# Patient Record
Sex: Male | Born: 1970
Health system: Southern US, Community
[De-identification: ages and names within clinical notes are randomized; demographics above are authoritative.]

## PROBLEM LIST (undated history)

## (undated) DIAGNOSIS — E785 Hyperlipidemia, unspecified: Secondary | ICD-10-CM

## (undated) DIAGNOSIS — I809 Phlebitis and thrombophlebitis of unspecified site: Secondary | ICD-10-CM

## (undated) DIAGNOSIS — I2699 Other pulmonary embolism without acute cor pulmonale: Secondary | ICD-10-CM

## (undated) DIAGNOSIS — K5090 Crohn's disease, unspecified, without complications: Secondary | ICD-10-CM

## (undated) HISTORY — DX: Hyperlipidemia, unspecified: E78.5

---

## 2002-11-25 ENCOUNTER — Ambulatory Visit (HOSPITAL_COMMUNITY): Admission: RE | Admit: 2002-11-25 | Discharge: 2002-11-25 | Payer: Self-pay | Admitting: *Deleted

## 2002-11-25 ENCOUNTER — Encounter (INDEPENDENT_AMBULATORY_CARE_PROVIDER_SITE_OTHER): Payer: Self-pay | Admitting: *Deleted

## 2005-09-18 ENCOUNTER — Encounter: Admission: RE | Admit: 2005-09-18 | Discharge: 2005-09-18 | Payer: Self-pay | Admitting: Family Medicine

## 2005-10-04 ENCOUNTER — Encounter: Admission: RE | Admit: 2005-10-04 | Discharge: 2005-10-04 | Payer: Self-pay | Admitting: Family Medicine

## 2006-03-05 ENCOUNTER — Encounter: Admission: RE | Admit: 2006-03-05 | Discharge: 2006-03-05 | Payer: Self-pay | Admitting: Gastroenterology

## 2006-11-26 ENCOUNTER — Encounter: Admission: RE | Admit: 2006-11-26 | Discharge: 2006-11-26 | Payer: Self-pay | Admitting: Gastroenterology

## 2006-12-18 ENCOUNTER — Encounter (HOSPITAL_COMMUNITY): Admission: RE | Admit: 2006-12-18 | Discharge: 2007-03-18 | Payer: Self-pay | Admitting: Gastroenterology

## 2007-02-12 ENCOUNTER — Ambulatory Visit: Payer: Self-pay | Admitting: Family Medicine

## 2007-02-17 DIAGNOSIS — K5090 Crohn's disease, unspecified, without complications: Secondary | ICD-10-CM | POA: Insufficient documentation

## 2007-02-18 ENCOUNTER — Encounter (INDEPENDENT_AMBULATORY_CARE_PROVIDER_SITE_OTHER): Payer: Self-pay | Admitting: Family Medicine

## 2007-02-18 ENCOUNTER — Telehealth (INDEPENDENT_AMBULATORY_CARE_PROVIDER_SITE_OTHER): Payer: Self-pay | Admitting: *Deleted

## 2007-02-18 LAB — CONVERTED CEMR LAB
Bilirubin Urine: NEGATIVE
Hemoglobin, Urine: NEGATIVE
Leukocytes, UA: NEGATIVE
Specific Gravity, Urine: 1.023 (ref 1.005–1.03)
Urine Glucose: NEGATIVE mg/dL

## 2007-04-02 ENCOUNTER — Encounter (HOSPITAL_COMMUNITY): Admission: RE | Admit: 2007-04-02 | Discharge: 2007-05-21 | Payer: Self-pay | Admitting: Gastroenterology

## 2007-04-23 ENCOUNTER — Encounter (INDEPENDENT_AMBULATORY_CARE_PROVIDER_SITE_OTHER): Payer: Self-pay | Admitting: Family Medicine

## 2007-05-28 ENCOUNTER — Encounter (HOSPITAL_COMMUNITY): Admission: RE | Admit: 2007-05-28 | Discharge: 2007-07-23 | Payer: Self-pay | Admitting: Gastroenterology

## 2007-07-12 ENCOUNTER — Encounter (INDEPENDENT_AMBULATORY_CARE_PROVIDER_SITE_OTHER): Payer: Self-pay | Admitting: Family Medicine

## 2007-08-18 ENCOUNTER — Encounter (INDEPENDENT_AMBULATORY_CARE_PROVIDER_SITE_OTHER): Payer: Self-pay | Admitting: Family Medicine

## 2007-09-17 ENCOUNTER — Encounter (HOSPITAL_COMMUNITY): Admission: RE | Admit: 2007-09-17 | Discharge: 2007-12-16 | Payer: Self-pay | Admitting: Gastroenterology

## 2007-11-19 ENCOUNTER — Encounter: Payer: Self-pay | Admitting: Family Medicine

## 2008-01-07 ENCOUNTER — Encounter (HOSPITAL_COMMUNITY): Admission: RE | Admit: 2008-01-07 | Discharge: 2008-04-06 | Payer: Self-pay | Admitting: Gastroenterology

## 2008-02-08 ENCOUNTER — Encounter: Payer: Self-pay | Admitting: Internal Medicine

## 2008-04-28 ENCOUNTER — Encounter (HOSPITAL_COMMUNITY): Admission: RE | Admit: 2008-04-28 | Discharge: 2008-06-07 | Payer: Self-pay | Admitting: Gastroenterology

## 2008-06-23 ENCOUNTER — Encounter (HOSPITAL_COMMUNITY): Admission: RE | Admit: 2008-06-23 | Discharge: 2008-08-23 | Payer: Self-pay | Admitting: Gastroenterology

## 2008-10-13 ENCOUNTER — Encounter (HOSPITAL_COMMUNITY): Admission: RE | Admit: 2008-10-13 | Discharge: 2009-01-11 | Payer: Self-pay | Admitting: Gastroenterology

## 2009-02-02 ENCOUNTER — Encounter (HOSPITAL_COMMUNITY): Admission: RE | Admit: 2009-02-02 | Discharge: 2009-02-02 | Payer: Self-pay | Admitting: Gastroenterology

## 2009-03-15 ENCOUNTER — Ambulatory Visit: Payer: Self-pay | Admitting: Diagnostic Radiology

## 2009-03-15 ENCOUNTER — Inpatient Hospital Stay (HOSPITAL_COMMUNITY): Admission: EM | Admit: 2009-03-15 | Discharge: 2009-03-16 | Payer: Self-pay | Admitting: Cardiology

## 2009-03-15 ENCOUNTER — Encounter: Payer: Self-pay | Admitting: Emergency Medicine

## 2009-03-15 ENCOUNTER — Ambulatory Visit: Payer: Self-pay | Admitting: Cardiovascular Disease

## 2009-03-30 ENCOUNTER — Encounter (HOSPITAL_COMMUNITY): Admission: RE | Admit: 2009-03-30 | Discharge: 2009-06-08 | Payer: Self-pay | Admitting: Gastroenterology

## 2009-08-03 ENCOUNTER — Encounter (HOSPITAL_COMMUNITY): Admission: RE | Admit: 2009-08-03 | Discharge: 2009-11-01 | Payer: Self-pay | Admitting: Gastroenterology

## 2009-11-30 ENCOUNTER — Encounter (HOSPITAL_COMMUNITY): Admission: RE | Admit: 2009-11-30 | Discharge: 2010-02-28 | Payer: Self-pay | Admitting: Gastroenterology

## 2010-03-29 ENCOUNTER — Encounter (HOSPITAL_COMMUNITY): Admission: RE | Admit: 2010-03-29 | Discharge: 2010-06-27 | Payer: Self-pay | Admitting: Gastroenterology

## 2010-07-19 ENCOUNTER — Encounter (HOSPITAL_COMMUNITY)
Admission: RE | Admit: 2010-07-19 | Discharge: 2010-10-15 | Payer: Self-pay | Source: Home / Self Care | Attending: Gastroenterology | Admitting: Gastroenterology

## 2010-11-08 ENCOUNTER — Encounter (HOSPITAL_COMMUNITY): Payer: BC Managed Care – PPO | Attending: Gastroenterology

## 2010-11-08 ENCOUNTER — Other Ambulatory Visit: Payer: Self-pay | Admitting: Gastroenterology

## 2010-11-08 DIAGNOSIS — K5090 Crohn's disease, unspecified, without complications: Secondary | ICD-10-CM | POA: Insufficient documentation

## 2010-11-08 LAB — COMPREHENSIVE METABOLIC PANEL
ALT: 37 U/L (ref 0–53)
AST: 32 U/L (ref 0–37)
Albumin: 3.7 g/dL (ref 3.5–5.2)
BUN: 11 mg/dL (ref 6–23)
CO2: 25 mEq/L (ref 19–32)
Chloride: 107 mEq/L (ref 96–112)
Glucose, Bld: 104 mg/dL — ABNORMAL HIGH (ref 70–99)
Total Protein: 6.9 g/dL (ref 6.0–8.3)

## 2010-11-08 LAB — DIFFERENTIAL
Basophils Absolute: 0 10*3/uL (ref 0.0–0.1)
Basophils Relative: 0 % (ref 0–1)
Eosinophils Absolute: 0.2 10*3/uL (ref 0.0–0.7)
Eosinophils Relative: 3 % (ref 0–5)
Lymphocytes Relative: 24 % (ref 12–46)
Lymphs Abs: 1.4 10*3/uL (ref 0.7–4.0)
Neutro Abs: 3.7 10*3/uL (ref 1.7–7.7)

## 2010-11-08 LAB — CBC
MCHC: 34.3 g/dL (ref 30.0–36.0)
MCV: 84.5 fL (ref 78.0–100.0)
RBC: 4.72 MIL/uL (ref 4.22–5.81)
WBC: 5.9 10*3/uL (ref 4.0–10.5)

## 2010-11-25 LAB — SEDIMENTATION RATE: Sed Rate: 19 mm/hr — ABNORMAL HIGH (ref 0–16)

## 2010-11-25 LAB — COMPREHENSIVE METABOLIC PANEL
AST: 31 U/L (ref 0–37)
Albumin: 3.5 g/dL (ref 3.5–5.2)
BUN: 12 mg/dL (ref 6–23)
CO2: 28 mEq/L (ref 19–32)
Calcium: 9.2 mg/dL (ref 8.4–10.5)
Chloride: 106 mEq/L (ref 96–112)
GFR calc Af Amer: >60 mL/min (ref >60)
Glucose, Bld: 74 mg/dL (ref 70–99)
Potassium: 4.3 mEq/L (ref 3.5–5.1)

## 2010-11-25 LAB — CBC
Platelets: 192 10*3/uL (ref 150–400)
WBC: 5.9 10*3/uL (ref 4.0–10.5)

## 2010-11-25 LAB — DIFFERENTIAL
Basophils Relative: 0 % (ref 0–1)
Eosinophils Absolute: 0.1 10*3/uL (ref 0.0–0.7)
Lymphocytes Relative: 26 % (ref 12–46)
Monocytes Relative: 12 % (ref 3–12)

## 2010-11-26 LAB — CBC
HCT: 40.3 % (ref 39.0–52.0)
Hemoglobin: 13.6 g/dL (ref 13.0–17.0)
MCH: 28.5 pg (ref 26.0–34.0)
MCHC: 33.7 g/dL (ref 30.0–36.0)
RBC: 4.78 MIL/uL (ref 4.22–5.81)
WBC: 6 10*3/uL (ref 4.0–10.5)

## 2010-11-26 LAB — COMPREHENSIVE METABOLIC PANEL
ALT: 23 U/L (ref 0–53)
Albumin: 3.7 g/dL (ref 3.5–5.2)
BUN: 11 mg/dL (ref 6–23)
Calcium: 9.2 mg/dL (ref 8.4–10.5)
GFR calc Af Amer: >60 mL/min (ref >60)
Glucose, Bld: 101 mg/dL — ABNORMAL HIGH (ref 70–99)
Total Bilirubin: 0.6 mg/dL (ref 0.3–1.2)

## 2010-11-26 LAB — DIFFERENTIAL
Basophils Absolute: 0 10*3/uL (ref 0.0–0.1)
Basophils Relative: 0 % (ref 0–1)
Eosinophils Absolute: 0.1 10*3/uL (ref 0.0–0.7)
Eosinophils Relative: 2 % (ref 0–5)
Lymphocytes Relative: 25 % (ref 12–46)
Neutrophils Relative %: 63 % (ref 43–77)

## 2010-11-28 LAB — CBC
MCH: 29 pg (ref 26.0–34.0)
Platelets: 223 10*3/uL (ref 150–400)
RBC: 4.83 MIL/uL (ref 4.22–5.81)

## 2010-11-28 LAB — SEDIMENTATION RATE: Sed Rate: 10 mm/hr (ref 0–16)

## 2010-11-28 LAB — DIFFERENTIAL
Lymphs Abs: 1.7 10*3/uL (ref 0.7–4.0)
Monocytes Relative: 11 % (ref 3–12)
Neutro Abs: 3.5 10*3/uL (ref 1.7–7.7)
Neutrophils Relative %: 57 % (ref 43–77)

## 2010-11-30 LAB — CBC
HCT: 37.6 % — ABNORMAL LOW (ref 39.0–52.0)
MCH: 28.9 pg (ref 26.0–34.0)
MCHC: 33 g/dL (ref 30.0–36.0)
MCV: 87.6 fL (ref 78.0–100.0)
Platelets: 216 10*3/uL (ref 150–400)

## 2010-11-30 LAB — DIFFERENTIAL
Monocytes Relative: 11 % (ref 3–12)
Neutrophils Relative %: 61 % (ref 43–77)

## 2010-11-30 LAB — SEDIMENTATION RATE: Sed Rate: 12 mm/hr (ref 0–16)

## 2010-12-03 LAB — COMPREHENSIVE METABOLIC PANEL
BUN: 10 mg/dL (ref 6–23)
CO2: 27 mEq/L (ref 19–32)
GFR calc Af Amer: >60 mL/min (ref >60)
GFR calc non Af Amer: >60 mL/min (ref >60)
Glucose, Bld: 111 mg/dL — ABNORMAL HIGH (ref 70–99)
Total Bilirubin: 0.5 mg/dL (ref 0.3–1.2)

## 2010-12-03 LAB — CBC
MCV: 86.9 fL (ref 78.0–100.0)
Platelets: 217 10*3/uL (ref 150–400)
RDW: 13.3 % (ref 11.5–15.5)

## 2010-12-03 LAB — DIFFERENTIAL
Eosinophils Absolute: 0.2 10*3/uL (ref 0.0–0.7)
Eosinophils Relative: 3 % (ref 0–5)
Lymphs Abs: 1.2 10*3/uL (ref 0.7–4.0)
Monocytes Relative: 8 % (ref 3–12)

## 2010-12-08 LAB — DIFFERENTIAL
Basophils Absolute: 0 10*3/uL (ref 0.0–0.1)
Basophils Relative: 1 % (ref 0–1)
Eosinophils Absolute: 0.1 10*3/uL (ref 0.0–0.7)
Eosinophils Relative: 2 % (ref 0–5)

## 2010-12-08 LAB — CBC
HCT: 38.3 % — ABNORMAL LOW (ref 39.0–52.0)
MCHC: 34 g/dL (ref 30.0–36.0)
MCV: 87.1 fL (ref 78.0–100.0)
Platelets: 223 10*3/uL (ref 150–400)
RDW: 13.6 % (ref 11.5–15.5)

## 2010-12-08 LAB — SEDIMENTATION RATE: Sed Rate: 20 mm/hr — ABNORMAL HIGH (ref 0–16)

## 2010-12-18 LAB — CBC
HCT: 36.8 % — ABNORMAL LOW (ref 39.0–52.0)
Platelets: 225 10*3/uL (ref 150–400)
RDW: 13.3 % (ref 11.5–15.5)

## 2010-12-18 LAB — DIFFERENTIAL
Basophils Absolute: 0 10*3/uL (ref 0.0–0.1)
Eosinophils Relative: 2 % (ref 0–5)
Lymphocytes Relative: 19 % (ref 12–46)

## 2010-12-18 LAB — SEDIMENTATION RATE: Sed Rate: 28 mm/hr — ABNORMAL HIGH (ref 0–16)

## 2010-12-20 LAB — DIFFERENTIAL
Basophils Absolute: 0 10*3/uL (ref 0.0–0.1)
Lymphocytes Relative: 15 % (ref 12–46)
Monocytes Absolute: 0.5 10*3/uL (ref 0.1–1.0)
Neutro Abs: 6.1 10*3/uL (ref 1.7–7.7)

## 2010-12-20 LAB — CBC
Hemoglobin: 12.3 g/dL — ABNORMAL LOW (ref 13.0–17.0)
Platelets: 239 10*3/uL (ref 150–400)
RDW: 13.4 % (ref 11.5–15.5)

## 2010-12-20 LAB — SEDIMENTATION RATE: Sed Rate: 29 mm/hr — ABNORMAL HIGH (ref 0–16)

## 2010-12-22 LAB — DIFFERENTIAL
Eosinophils Relative: 1 % (ref 0–5)
Lymphocytes Relative: 15 % (ref 12–46)
Lymphocytes Relative: 17 % (ref 12–46)
Lymphs Abs: 1.3 10*3/uL (ref 0.7–4.0)
Lymphs Abs: 1.4 10*3/uL (ref 0.7–4.0)
Monocytes Absolute: 0.6 10*3/uL (ref 0.1–1.0)
Monocytes Relative: 8 % (ref 3–12)
Neutro Abs: 6.2 10*3/uL (ref 1.7–7.7)
Neutro Abs: 6.5 10*3/uL (ref 1.7–7.7)

## 2010-12-22 LAB — CARDIAC PANEL(CRET KIN+CKTOT+MB+TROPI)
CK, MB: 1.9 ng/mL (ref 0.3–4.0)
Relative Index: 1.4 (ref 0.0–2.5)
Relative Index: 1.6 (ref 0.0–2.5)
Total CK: 145 U/L (ref 7–232)
Troponin I: 0.01 ng/mL (ref 0.00–0.06)

## 2010-12-22 LAB — HEPATIC FUNCTION PANEL
ALT: 23 U/L (ref 0–53)
AST: 28 U/L (ref 0–37)
Albumin: 4.2 g/dL (ref 3.5–5.2)
Alkaline Phosphatase: 85 U/L (ref 39–117)
Indirect Bilirubin: 0.2 mg/dL — ABNORMAL LOW (ref 0.3–0.9)
Total Protein: 7.8 g/dL (ref 6.0–8.3)

## 2010-12-22 LAB — CBC
HCT: 39.5 % (ref 39.0–52.0)
Hemoglobin: 12.4 g/dL — ABNORMAL LOW (ref 13.0–17.0)
Hemoglobin: 13.2 g/dL (ref 13.0–17.0)
Platelets: 239 10*3/uL (ref 150–400)
RBC: 4.13 MIL/uL — ABNORMAL LOW (ref 4.22–5.81)
WBC: 8.2 10*3/uL (ref 4.0–10.5)
WBC: 8.7 10*3/uL (ref 4.0–10.5)

## 2010-12-22 LAB — BASIC METABOLIC PANEL
BUN: 12 mg/dL (ref 6–23)
Calcium: 9.2 mg/dL (ref 8.4–10.5)
GFR calc non Af Amer: >60 mL/min (ref >60)
Potassium: 4.1 mEq/L (ref 3.5–5.1)
Sodium: 142 mEq/L (ref 135–145)

## 2010-12-22 LAB — POCT CARDIAC MARKERS
CKMB, poc: 1 ng/mL (ref 1.0–8.0)
CKMB, poc: 1.2 ng/mL (ref 1.0–8.0)
Myoglobin, poc: 88.8 ng/mL (ref 12–200)
Troponin i, poc: 0.12 ng/mL — ABNORMAL HIGH (ref 0.00–0.09)
Troponin i, poc: <0.05 ng/mL (ref 0.00–0.09)

## 2010-12-22 LAB — TSH: TSH: 1.067 u[IU]/mL (ref 0.350–4.500)

## 2010-12-24 LAB — CBC
MCHC: 34.3 g/dL (ref 30.0–36.0)
Platelets: 228 10*3/uL (ref 150–400)
RDW: 13.7 % (ref 11.5–15.5)

## 2010-12-24 LAB — DIFFERENTIAL
Basophils Absolute: 0.1 10*3/uL (ref 0.0–0.1)
Basophils Relative: 1 % (ref 0–1)
Lymphocytes Relative: 19 % (ref 12–46)
Neutro Abs: 5.3 10*3/uL (ref 1.7–7.7)
Neutrophils Relative %: 72 % (ref 43–77)

## 2010-12-24 LAB — SEDIMENTATION RATE: Sed Rate: 21 mm/hr — ABNORMAL HIGH (ref 0–16)

## 2010-12-26 LAB — DIFFERENTIAL
Basophils Absolute: 0.1 10*3/uL (ref 0.0–0.1)
Basophils Relative: 1 % (ref 0–1)
Neutro Abs: 4.8 10*3/uL (ref 1.7–7.7)
Neutrophils Relative %: 70 % (ref 43–77)

## 2010-12-26 LAB — CBC
Hemoglobin: 14 g/dL (ref 13.0–17.0)
MCHC: 34.2 g/dL (ref 30.0–36.0)
RBC: 4.77 MIL/uL (ref 4.22–5.81)

## 2010-12-26 LAB — SEDIMENTATION RATE: Sed Rate: 16 mm/hr (ref 0–16)

## 2010-12-30 LAB — DIFFERENTIAL
Basophils Relative: 0 % (ref 0–1)
Lymphs Abs: 1.1 10*3/uL (ref 0.7–4.0)
Monocytes Relative: 7 % (ref 3–12)
Neutro Abs: 6.5 10*3/uL (ref 1.7–7.7)
Neutrophils Relative %: 78 % — ABNORMAL HIGH (ref 43–77)

## 2010-12-30 LAB — SEDIMENTATION RATE: Sed Rate: 15 mm/hr (ref 0–16)

## 2010-12-30 LAB — CBC
RBC: 4.49 MIL/uL (ref 4.22–5.81)
WBC: 8.4 10*3/uL (ref 4.0–10.5)

## 2011-01-03 ENCOUNTER — Encounter (HOSPITAL_COMMUNITY): Payer: BC Managed Care – PPO | Attending: Gastroenterology

## 2011-01-03 ENCOUNTER — Other Ambulatory Visit: Payer: Self-pay | Admitting: Gastroenterology

## 2011-01-03 DIAGNOSIS — K5090 Crohn's disease, unspecified, without complications: Secondary | ICD-10-CM | POA: Insufficient documentation

## 2011-01-03 LAB — DIFFERENTIAL
Basophils Relative: 0 % (ref 0–1)
Lymphs Abs: 1.7 10*3/uL (ref 0.7–4.0)
Monocytes Relative: 9 % (ref 3–12)
Neutro Abs: 4.1 10*3/uL (ref 1.7–7.7)
Neutrophils Relative %: 63 % (ref 43–77)

## 2011-01-03 LAB — COMPREHENSIVE METABOLIC PANEL
ALT: 29 U/L (ref 0–53)
AST: 30 U/L (ref 0–37)
Albumin: 3.7 g/dL (ref 3.5–5.2)
Alkaline Phosphatase: 71 U/L (ref 39–117)
CO2: 26 mEq/L (ref 19–32)
Chloride: 105 mEq/L (ref 96–112)
Creatinine, Ser: 0.91 mg/dL (ref 0.4–1.5)
GFR calc Af Amer: >60 mL/min (ref >60)
GFR calc non Af Amer: >60 mL/min (ref >60)
Potassium: 4.2 mEq/L (ref 3.5–5.1)
Total Bilirubin: 0.6 mg/dL (ref 0.3–1.2)

## 2011-01-03 LAB — CBC
Hemoglobin: 13.7 g/dL (ref 13.0–17.0)
MCH: 29.1 pg (ref 26.0–34.0)
RBC: 4.71 MIL/uL (ref 4.22–5.81)
WBC: 6.5 10*3/uL (ref 4.0–10.5)

## 2011-01-28 NOTE — Assessment & Plan Note (Signed)
[facility] OFFICE NOTE   ILIA, ENGELBERT                   MRN:          [id number]  DATE:02/12/2007                            DOB:          1971/08/27    Jose [name] presents today to establish care in our office. He has a history  of Crohn's disease that is followed closely by gastroenterology.   The patient does have a concern that was brought up by his recent lab  work performed by his gastroenterologist. He was noted to have mild  hematuria. The patient reports that he does not note any blood in his  urine. He denies any urinary symptoms. He was not aware that he had  microscopic hematuria.   The patient also reports a long history of recurrent scaling of his  scalp during the spring season, usually by the summer it resolves. The  patient has never used any over-the-counter therapy for dandruff.   PAST MEDICAL HISTORY:  Crohn's colitis.   MEDICATIONS:  1. Remicade.  2. Asacol.  3. Fosamax.   ALLERGIES:  No known drug allergies.   SURGICAL HISTORY:  Anal fistula repair.   SOCIAL HISTORY:  The patient is an Chief Financial Officer. He is married with three  children. He smokes a 1/2 pack per day. He denies alcohol use.   REVIEW OF SYSTEMS:  As per the HPI, otherwise unremarkable.   OBJECTIVE:  VITAL SIGNS:  Weight 194.2 pounds, temperature 98.9, pulse  80, respiratory rate 20, blood pressure 100/70.  GENERAL:  Pleasant male in no acute distress, questions appropriately.  SCALP:  Significant for mild scaling around the hairline with minimal  erythema.  LUNGS:  Clear.  HEART:  Regular rate and rhythm, normal S1, S2, no murmurs, rubs, or  gallops.  ABDOMEN:  Soft, no palpable masses, no hepatosplenomegaly, no rebound or  guarding.   LABORATORY DATA:  Performed by Dr. Michail Sermon showed a urinalysis with 2+  of blood and 5-10 RBCs per high power field, specific gravity was 1.025  and was negative for  leukocytes and nitrites. Of note, this laboratory  test was done on 11/26/06.   IMPRESSION:  1. Seborrheic dermatitis.  2. History of microscopic hematuria.   PLAN:  1. Recommended that the patient use over-the-counter Selsun Blue or      Head & Shoulders as advised on the bottle.  2. Pathophysiology of seborrheic dermatitis was reviewed. I advised      the patient that this is a chronic recurrent condition, but it is      treatable and can be controlled. He is advised to follow up if the      above is not helpful.  3. UA and urine culture were ordered. Further recommendations after      review of the above.  4. The patient is to follow up as needed in the interim.     Leone Haven, M.D.  Electronically Signed    LA/MedQ  DD: 02/12/2007  DT: 02/13/2007  Job #: [id number]

## 2011-01-28 NOTE — H&P (Signed)
Jose [name], WISLER NO.:  [id number]   MEDICAL RECORD NO.:  [id number]          PATIENT TYPE:  INP   LOCATION:  2032                         FACILITY:  [facility]   PHYSICIAN:  Arlee Muslim, MD    DATE OF BIRTH:  [birth year]   DATE OF ADMISSION:  03/15/2009  DATE OF DISCHARGE:                              HISTORY & PHYSICAL   CHIEF COMPLAINT:  Chest discomfort.   HISTORY OF PRESENT ILLNESS:  The patient is a 40 year old male, past  medical history significant for hyperlipidemia, tobacco use, Crohn  disease, who is presenting with 2-day history of substernal chest  discomfort.  The patient states that over the past several months, he  has not felt himself.  He saw [name] as an outpatient, at which time  he had an exercise EKG which per his report was within normal limits.  Approximately 1 week ago, he was told that he had hyperlipidemia and was  started on simvastatin.  A day before today's admission, the patient had  onset of substernal chest discomfort associated with some left arm  numbness that lasted several hours.  The patient called the doctor's  office and was told to stop his simvastatin and see if the pain  improved.  The patient held the simvastatin and once again today had  recurrence of the this chest discomfort associated with left arm  numbness and some shortness of breath.  Of note, the patient states that  he does not usually exercise regularly but over the past week has  exercised on the treadmill several times for 40 minutes, each time  without any difficulty.  Specifically, he denies any chest discomfort or  exaggerated shortness of breath during exercise.  Of note, the patient  travels frequently and recently has had several trips to [place] in which  he was seated for prolonged period of time on the airplane.   PAST MEDICAL HISTORY:  As above.   SOCIAL HISTORY:  The patient smokes daily.  Does not drink alcohol.  There is no history of  drug use.   FAMILY HISTORY:  Positive for premature coronary disease.   ALLERGIES:  NO KNOWN DRUG ALLERGIES.   MEDICATIONS:  Remicade.  Fosamax.  Asacol.  Multivitamins.  Folic acid.   REVIEW OF SYSTEMS:  Positive for recent dizziness with exertion and also  positive for anxiety.  Other systems as in HPI, otherwise negative.   PHYSICAL EXAMINATION:  On physical exam, temperature 98, pulse 79,  respirations 14, blood pressure 127/77.  GENERAL:  In no acute distress.  HEENT: Nonfocal.  NECK:  Supple.  There is no JVD.  HEART:  Regular rate and rhythm without murmur or gallop.  LUNGS: Clear bilaterally.  ABDOMEN:  Soft, nontender, nondistended.  Chest wall is not tender to  palpation.  EXTREMITIES:  Without edema.  SKIN:  Warm and dry.  There is no evidence of rash or lesion.  NEURO:  Nonfocal.  PSYCHIATRIC:  The patient is appropriate with normal levels of insight.   LABS:  Sodium 142, potassium 4.1, chloride 104, CO2 28, BUN 12,  creatinine 1.1, glucose  91.  White count nine, hemoglobin 13, hematocrit  40, platelet count 239.   Chest x-ray is within normal limits.   Troponin:  The first troponin is 0.12.  The second troponin is less than  0.05.  CK-MB 1.2.  The second CK-MB is 1.0.   EKG is normal sinus rhythm without any ST or T-wave abnormalities.   ASSESSMENT:  Atypical chest discomfort in a patient with a recent  negative exercise stress EKG.  Because the patient's recent extensive  travel, pulmonary embolism is certainly on the differential.  Also on  the differential is statin myalgia.  Lastly, this could simply be  musculoskeletal abnormality that is not secondary to a medication.   PLAN:  The patient will be admitted to telemetry unit and ruled out for  acute myocardial infarction, although I suspect the probability of this  diagnosis is very low.  We will order a CT scan of the chest to rule out  pulmonary embolism.  We will place the patient on aspirin and a  low-dose  beta blocker.  We will continue to hold the patient's Zocor.  Lastly, we  will place the patient on Lovenox 40 mg subcu daily for DVT prophylaxis.      Arlee Muslim, MD  Electronically Signed     SGA/MEDQ  D:  03/15/2009  T:  03/15/2009  Job:  [id number]

## 2011-01-31 NOTE — Op Note (Signed)
   Jose [name], Jose [name]                      ACCOUNT NO.:  [id number]   MEDICAL RECORD NO.:  [id number]                   PATIENT TYPE:  AMB   LOCATION:  DAY                                  FACILITY:  [facility]   PHYSICIAN:  Darrelyn Hillock, M.D.         DATE OF BIRTH:  02-12-1971   DATE OF PROCEDURE:  11/25/2002  DATE OF DISCHARGE:                                 OPERATIVE REPORT   PREOPERATIVE DIAGNOSES:  Anal pain.   POSTOPERATIVE DIAGNOSES:  Anal pain, active anorectal Crohn's.   PROCEDURE:  Rectal ultrasound, rigid sigmoidoscopy, anal biopsy.   ANESTHESIA:  General.   DESCRIPTION OF PROCEDURE:  After general anesthesia was induced, he was  placed in lithotomy position and perirectal prep was undertaken. On visual  inspection, he had significant fibrosis and scarring from his previous  fistulotomies. Rectal ultrasound was then performed by Dr. Annabell Sabal who  will dictate this in a separate note. Rigid sigmoidoscopy was then  accomplished at 20 cm. From 10 cm distal, the patient had very active  inflammation of the mucosa and hypertrophy of the crypts and papilla. On  visual inspection of the distal rectum and anus, the patient had very  significant inflammation with mucosal bridging and marked bleeding secondary  to inflammation. The mucosal bridge was biopsied after excision and sent for  pathologic evaluation. Adequate hemostasis was ensured. Gelfoam packing was  placed.                                               Darrelyn Hillock, M.D.    KRH/MEDQ  D:  11/25/2002  T:  11/25/2002  Job:  [id number]   cc:   Raelyn Ensign. Vladimir Faster, M.D.  [address], [place]  [place] [address]  Fax: [phone]

## 2011-02-28 ENCOUNTER — Encounter (HOSPITAL_COMMUNITY): Payer: BC Managed Care – PPO | Attending: Gastroenterology

## 2011-02-28 DIAGNOSIS — K5090 Crohn's disease, unspecified, without complications: Secondary | ICD-10-CM | POA: Insufficient documentation

## 2011-04-25 ENCOUNTER — Encounter (HOSPITAL_COMMUNITY): Payer: BC Managed Care – PPO | Attending: Gastroenterology

## 2011-04-25 ENCOUNTER — Other Ambulatory Visit: Payer: Self-pay | Admitting: Gastroenterology

## 2011-04-25 DIAGNOSIS — K5090 Crohn's disease, unspecified, without complications: Secondary | ICD-10-CM | POA: Insufficient documentation

## 2011-04-25 LAB — COMPREHENSIVE METABOLIC PANEL
ALT: 27 U/L (ref 0–53)
Albumin: 3.5 g/dL (ref 3.5–5.2)
Alkaline Phosphatase: 86 U/L (ref 39–117)
BUN: 15 mg/dL (ref 6–23)
Chloride: 104 mEq/L (ref 96–112)
Glucose, Bld: 142 mg/dL — ABNORMAL HIGH (ref 70–99)
Potassium: 3.9 mEq/L (ref 3.5–5.1)
Sodium: 138 mEq/L (ref 135–145)
Total Bilirubin: 0.3 mg/dL (ref 0.3–1.2)
Total Protein: 7 g/dL (ref 6.0–8.3)

## 2011-04-25 LAB — DIFFERENTIAL
Basophils Relative: 1 % (ref 0–1)
Eosinophils Absolute: 0.2 10*3/uL (ref 0.0–0.7)
Lymphs Abs: 1.6 10*3/uL (ref 0.7–4.0)
Monocytes Relative: 7 % (ref 3–12)
Neutro Abs: 3.6 10*3/uL (ref 1.7–7.7)
Neutrophils Relative %: 62 % (ref 43–77)

## 2011-04-25 LAB — CBC
Hemoglobin: 13.6 g/dL (ref 13.0–17.0)
MCH: 28.9 pg (ref 26.0–34.0)
Platelets: 208 10*3/uL (ref 150–400)
RBC: 4.71 MIL/uL (ref 4.22–5.81)
WBC: 5.8 10*3/uL (ref 4.0–10.5)

## 2011-05-18 ENCOUNTER — Emergency Department (HOSPITAL_BASED_OUTPATIENT_CLINIC_OR_DEPARTMENT_OTHER)
Admission: EM | Admit: 2011-05-18 | Discharge: 2011-05-18 | Disposition: A | Discharge status: Home / Self Care | Payer: BC Managed Care – PPO | Attending: Emergency Medicine | Admitting: Emergency Medicine

## 2011-05-18 ENCOUNTER — Encounter: Payer: Self-pay | Admitting: *Deleted

## 2011-05-18 DIAGNOSIS — I808 Phlebitis and thrombophlebitis of other sites: Secondary | ICD-10-CM

## 2011-05-18 DIAGNOSIS — K5090 Crohn's disease, unspecified, without complications: Secondary | ICD-10-CM | POA: Insufficient documentation

## 2011-05-18 DIAGNOSIS — F17200 Nicotine dependence, unspecified, uncomplicated: Secondary | ICD-10-CM | POA: Insufficient documentation

## 2011-05-18 DIAGNOSIS — M79609 Pain in unspecified limb: Secondary | ICD-10-CM | POA: Insufficient documentation

## 2011-05-18 HISTORY — DX: Crohn's disease, unspecified, without complications: K50.90

## 2011-05-18 NOTE — ED Notes (Signed)
Pt states he had an IV infusion on August 10th. Began to notice pain and small "bubbles"/"bumps" in the area. Went to Urgent Care 4 days ago. Ultrasound done. Diagnosed with blood clot. Told to take Advil and Aspirin and it would probably resolve in 2 weeks. Pt is traveling tomorrow by air and worried that something will happen.

## 2011-05-18 NOTE — ED Provider Notes (Signed)
Scribed for Dr. Fonnie Jarvis, the patient was seen in room CT2. This chart was scribed by Hillery Hunter. This patient's care was started at 22:31.   History   CSN: [id number] Arrival date & time: 05/18/2011  9:29 PM  Chief Complaint  Patient presents with  . Extremity Pain   Patient is a 40 y.o. male presenting with extremity pain. The history is provided by the patient.  Extremity Pain Pertinent negatives include no chest pain, no abdominal pain, no headaches and no shortness of breath.    Jose [name] is a 40 y.o. male who presents to the Emergency Department complaining of lump on the back of his hand at the site of IV infusion of Remicade (22 days ago) to prevent Crohn's flare up just as he usually does every two months. He reports some irritation at the area with activity and carrying his children today. He is concerned about possible danger of superficial blood clot in the area. He went to urgent care about five days ago and was told to manage pain with Ibuprofen and to expect this to resolve after a long time.   Past Medical History  Diagnosis Date  . Crohn's disease     History reviewed. No pertinent past surgical history.  History reviewed. No pertinent family history.  History  Substance Use Topics  . Smoking status: Current Everyday Smoker  . Smokeless tobacco: Not on file  . Alcohol Use: No      Review of Systems  Constitutional: Negative for fever and activity change.  HENT: Negative for congestion and sore throat.   Respiratory: Negative for shortness of breath.   Cardiovascular: Negative for chest pain.  Gastrointestinal: Negative for abdominal pain.  Skin: Negative for rash.  Neurological: Negative for weakness, numbness and headaches.  Psychiatric/Behavioral: Negative for confusion.  All other systems reviewed and are negative.    Physical Exam  BP 128/71  Pulse 76  Temp(Src) 98.4 F (36.9 C) (Oral)  Resp 20  Ht 5\' 7"  (1.702 m)  Wt  184 lb (83.462 kg)  BMI 28.82 kg/m2  SpO2 98%  Physical Exam  Nursing note and vitals reviewed. Constitutional:       Awake, alert, nontoxic appearance.  HENT:  Head: Atraumatic.  Eyes: Right eye exhibits no discharge. Left eye exhibits no discharge.  Neck: Neck supple.  Cardiovascular:       Good capillary refill all extremities  Pulmonary/Chest: Effort normal. He exhibits no tenderness.  Abdominal: Soft. There is no tenderness. There is no rebound.  Musculoskeletal: He exhibits no tenderness.       Baseline ROM, no obvious new focal weakness.  Lymphadenopathy:    He has no cervical adenopathy.  Neurological:       Mental status and motor strength appears baseline for patient and situation.  Skin: No rash noted.       Six inches of superficial thrombophlebitis dorsum of left wrist  Psychiatric: He has a normal mood and affect.  Ext: left arm superficial thrombophlebitis only distal forearm without cellulitis, and left hand normal LT/FROM/NT and motor intact with CR<2secs.  ED Course  Procedures  DIAGNOSTIC STUDIES: Oxygen Saturation is 98% on room air, normal by my interpretation.     MDM: I doubt any other EMC precluding discharge at this time including, but not necessarily limited to the following:DVT.   IMPRESSION: Diagnoses that have been ruled out:  Diagnoses that are still under consideration:  Final diagnoses:    PLAN:  Discharge home  CONDITION ON DISCHARGE: Good   SCRIBE ATTESTATION: I personally performed the services described in this documentation, which was scribed in my presence. The recorded information has been reviewed and considered. No att. providers found       Hurman Horn, MD 05/19/11 1314

## 2011-06-05 LAB — SEDIMENTATION RATE: Sed Rate: 21 — ABNORMAL HIGH

## 2011-06-05 LAB — CBC
HCT: 37.1 — ABNORMAL LOW
MCV: 83.9
Platelets: 232
RDW: 13.5
WBC: 5.8

## 2011-06-06 LAB — SEDIMENTATION RATE: Sed Rate: 25 — ABNORMAL HIGH

## 2011-06-06 LAB — CBC
MCHC: 33.7
Platelets: 265
RDW: 12.9

## 2011-06-10 LAB — CBC
Platelets: 270
RBC: 4.64
WBC: 7.5

## 2011-06-10 LAB — SEDIMENTATION RATE: Sed Rate: 27 — ABNORMAL HIGH

## 2011-06-12 LAB — CBC
HCT: 39.6
Hemoglobin: 13.6
RBC: 4.66
RDW: 13.6
WBC: 7.4

## 2011-06-13 ENCOUNTER — Encounter (HOSPITAL_COMMUNITY)
Admission: RE | Admit: 2011-06-13 | Discharge: 2011-06-13 | Disposition: A | Discharge status: Home / Self Care | Payer: BC Managed Care – PPO | Source: Ambulatory Visit | Attending: Gastroenterology | Admitting: Gastroenterology

## 2011-06-13 DIAGNOSIS — K5090 Crohn's disease, unspecified, without complications: Secondary | ICD-10-CM | POA: Insufficient documentation

## 2011-06-13 LAB — CBC
HCT: 40.7
Hemoglobin: 13.8
MCV: 85.7
RBC: 4.75
WBC: 7.6

## 2011-06-13 LAB — SEDIMENTATION RATE: Sed Rate: 20 — ABNORMAL HIGH

## 2011-06-17 LAB — CBC
HCT: 38.6 % — ABNORMAL LOW (ref 39.0–52.0)
Hemoglobin: 12.6 g/dL — ABNORMAL LOW (ref 13.0–17.0)
MCV: 86.3 fL (ref 78.0–100.0)
RDW: 14.1 % (ref 11.5–15.5)

## 2011-06-20 ENCOUNTER — Encounter (HOSPITAL_COMMUNITY): Payer: BC Managed Care – PPO

## 2011-06-20 LAB — SEDIMENTATION RATE: Sed Rate: 22 mm/hr — ABNORMAL HIGH (ref 0–16)

## 2011-06-20 LAB — CBC
HCT: 38.5 % — ABNORMAL LOW (ref 39.0–52.0)
Platelets: 222 10*3/uL (ref 150–400)
RDW: 13.9 % (ref 11.5–15.5)

## 2011-06-27 LAB — SEDIMENTATION RATE: Sed Rate: 15

## 2011-06-27 LAB — CBC
MCHC: 34.3
RBC: 4.45
RDW: 13.4

## 2011-06-30 LAB — CBC
HCT: 37.4 — ABNORMAL LOW
Platelets: 219
RBC: 4.3
WBC: 7.6

## 2011-06-30 LAB — SEDIMENTATION RATE: Sed Rate: 11

## 2011-07-15 ENCOUNTER — Other Ambulatory Visit (HOSPITAL_COMMUNITY): Payer: Self-pay | Admitting: *Deleted

## 2011-07-30 MED ORDER — SODIUM CHLORIDE 0.9 % IV SOLN: 7.5000 mg/kg | INTRAVENOUS | Status: DC

## 2011-07-30 MED ORDER — METHYLPREDNISOLONE SODIUM SUCC 125 MG IJ SOLR: 40.0000 mg | Freq: Once | INTRAMUSCULAR | Status: DC

## 2011-07-30 MED ORDER — DIPHENHYDRAMINE HCL 50 MG/ML IJ SOLN: 50.0000 mg | Freq: Once | INTRAMUSCULAR | Status: DC

## 2011-07-30 MED ORDER — ACETAMINOPHEN 325 MG PO TABS: 650.0000 mg | ORAL_TABLET | Freq: Once | ORAL | Status: DC

## 2011-08-01 ENCOUNTER — Encounter (HOSPITAL_COMMUNITY): Payer: BC Managed Care – PPO

## 2011-08-08 ENCOUNTER — Encounter (HOSPITAL_COMMUNITY)
Admission: RE | Admit: 2011-08-08 | Discharge: 2011-08-08 | Disposition: A | Discharge status: Home / Self Care | Payer: BC Managed Care – PPO | Source: Ambulatory Visit | Attending: Gastroenterology | Admitting: Gastroenterology

## 2011-08-08 ENCOUNTER — Encounter (HOSPITAL_COMMUNITY): Payer: BC Managed Care – PPO

## 2011-08-08 DIAGNOSIS — K5090 Crohn's disease, unspecified, without complications: Secondary | ICD-10-CM | POA: Insufficient documentation

## 2011-08-08 LAB — CBC
Hemoglobin: 13.6 g/dL (ref 13.0–17.0)
RBC: 4.67 MIL/uL (ref 4.22–5.81)

## 2011-08-08 LAB — DIFFERENTIAL
Basophils Absolute: 0 10*3/uL (ref 0.0–0.1)
Eosinophils Absolute: 0.2 10*3/uL (ref 0.0–0.7)
Eosinophils Relative: 3 % (ref 0–5)

## 2011-08-08 LAB — COMPREHENSIVE METABOLIC PANEL
ALT: 22 U/L (ref 0–53)
Alkaline Phosphatase: 84 U/L (ref 39–117)
CO2: 25 mEq/L (ref 19–32)
GFR calc Af Amer: >90 mL/min (ref >90)
GFR calc non Af Amer: >90 mL/min (ref >90)
Glucose, Bld: 85 mg/dL (ref 70–99)
Potassium: 4.2 mEq/L (ref 3.5–5.1)
Sodium: 140 mEq/L (ref 135–145)
Total Bilirubin: 0.2 mg/dL — ABNORMAL LOW (ref 0.3–1.2)

## 2011-08-08 MED ORDER — SODIUM CHLORIDE 0.9 % IV SOLN
7.5000 mg/kg | INTRAVENOUS | Status: AC
  Administered 2011-08-08: 600 mg via INTRAVENOUS
  Filled 2011-08-08: qty 60

## 2011-08-08 MED ORDER — METHYLPREDNISOLONE SODIUM SUCC 125 MG IJ SOLR
40.0000 mg | INTRAMUSCULAR | Status: DC
  Administered 2011-08-08: 40 mg via INTRAVENOUS

## 2011-08-08 MED ORDER — DIPHENHYDRAMINE HCL 50 MG/ML IJ SOLN
50.0000 mg | INTRAMUSCULAR | Status: AC
  Administered 2011-08-08: 50 mg via INTRAVENOUS

## 2011-08-08 MED ORDER — METHYLPREDNISOLONE SODIUM SUCC 40 MG IJ SOLR
INTRAMUSCULAR | Status: AC
  Administered 2011-08-08: 40 mg via INTRAVENOUS
  Filled 2011-08-08: qty 1

## 2011-08-08 MED ORDER — ACETAMINOPHEN 325 MG PO TABS
650.0000 mg | ORAL_TABLET | ORAL | Status: DC
  Administered 2011-08-08: 650 mg via ORAL

## 2011-08-08 MED ORDER — DIPHENHYDRAMINE HCL 50 MG/ML IJ SOLN
INTRAMUSCULAR | Status: AC
  Administered 2011-08-08: 50 mg via INTRAVENOUS
  Filled 2011-08-08: qty 1

## 2011-08-08 MED ORDER — ACETAMINOPHEN 325 MG PO TABS
ORAL_TABLET | ORAL | Status: AC
  Administered 2011-08-08: 650 mg via ORAL
  Filled 2011-08-08: qty 2

## 2011-09-26 ENCOUNTER — Encounter (HOSPITAL_COMMUNITY)
Admission: RE | Admit: 2011-09-26 | Discharge: 2011-09-26 | Disposition: A | Discharge status: Home / Self Care | Payer: BC Managed Care – PPO | Source: Ambulatory Visit | Attending: Gastroenterology | Admitting: Gastroenterology

## 2011-09-26 DIAGNOSIS — K5090 Crohn's disease, unspecified, without complications: Secondary | ICD-10-CM | POA: Insufficient documentation

## 2011-09-26 MED ORDER — ACETAMINOPHEN 325 MG PO TABS: 650.0000 mg | ORAL_TABLET | ORAL | Status: DC

## 2011-09-26 MED ORDER — ACETAMINOPHEN 325 MG PO TABS
ORAL_TABLET | ORAL | Status: AC
  Administered 2011-09-26: 650 mg
  Filled 2011-09-26: qty 2

## 2011-09-26 MED ORDER — SODIUM CHLORIDE 0.9 % IV SOLN
7.5000 mg/kg | INTRAVENOUS | Status: AC
  Administered 2011-09-26: 600 mg via INTRAVENOUS
  Filled 2011-09-26: qty 60

## 2011-09-26 MED ORDER — METHYLPREDNISOLONE SODIUM SUCC 125 MG IJ SOLR: 40.0000 mg | INTRAMUSCULAR | Status: DC

## 2011-09-26 MED ORDER — DIPHENHYDRAMINE HCL 50 MG/ML IJ SOLN: 50.0000 mg | INTRAMUSCULAR | Status: DC

## 2011-09-26 MED ORDER — METHYLPREDNISOLONE SODIUM SUCC 40 MG IJ SOLR
INTRAMUSCULAR | Status: AC
  Administered 2011-09-26: 40 mg
  Filled 2011-09-26: qty 1

## 2011-09-26 MED ORDER — DIPHENHYDRAMINE HCL 50 MG/ML IJ SOLN
INTRAMUSCULAR | Status: AC
  Administered 2011-09-26: 50 mg
  Filled 2011-09-26: qty 1

## 2011-11-10 ENCOUNTER — Other Ambulatory Visit (HOSPITAL_COMMUNITY): Payer: Self-pay | Admitting: *Deleted

## 2011-11-14 ENCOUNTER — Encounter (HOSPITAL_COMMUNITY)
Admission: RE | Admit: 2011-11-14 | Discharge: 2011-11-14 | Disposition: A | Discharge status: Home / Self Care | Payer: BC Managed Care – PPO | Source: Ambulatory Visit | Attending: Gastroenterology | Admitting: Gastroenterology

## 2011-11-14 DIAGNOSIS — K5090 Crohn's disease, unspecified, without complications: Secondary | ICD-10-CM | POA: Insufficient documentation

## 2011-11-14 LAB — CBC
Platelets: 178 10*3/uL (ref 150–400)
RDW: 13.1 % (ref 11.5–15.5)
WBC: 7.6 10*3/uL (ref 4.0–10.5)

## 2011-11-14 LAB — DIFFERENTIAL
Basophils Relative: 0 % (ref 0–1)
Eosinophils Absolute: 0.2 10*3/uL (ref 0.0–0.7)
Monocytes Absolute: 0.8 10*3/uL (ref 0.1–1.0)
Monocytes Relative: 11 % (ref 3–12)

## 2011-11-14 LAB — COMPREHENSIVE METABOLIC PANEL
AST: 24 U/L (ref 0–37)
Albumin: 3.6 g/dL (ref 3.5–5.2)
Alkaline Phosphatase: 87 U/L (ref 39–117)
Chloride: 107 mEq/L (ref 96–112)
Potassium: 4.1 mEq/L (ref 3.5–5.1)
Total Bilirubin: 0.1 mg/dL — ABNORMAL LOW (ref 0.3–1.2)

## 2011-11-14 MED ORDER — ACETAMINOPHEN 325 MG PO TABS
650.0000 mg | ORAL_TABLET | ORAL | Status: DC
  Administered 2011-11-14: 650 mg via ORAL
  Filled 2011-11-14: qty 2

## 2011-11-14 MED ORDER — SODIUM CHLORIDE 0.9 % IV SOLN
7.5000 mg/kg | INTRAVENOUS | Status: AC
  Administered 2011-11-14: 600 mg via INTRAVENOUS
  Filled 2011-11-14: qty 60

## 2011-11-14 MED ORDER — DIPHENHYDRAMINE HCL 50 MG/ML IJ SOLN
50.0000 mg | INTRAMUSCULAR | Status: DC
  Administered 2011-11-14: 50 mg via INTRAVENOUS
  Filled 2011-11-14: qty 1

## 2011-11-14 MED ORDER — METHYLPREDNISOLONE SODIUM SUCC 40 MG IJ SOLR
INTRAMUSCULAR | Status: AC
  Administered 2011-11-14: 40 mg via INTRAVENOUS
  Filled 2011-11-14: qty 1

## 2011-11-14 MED ORDER — METHYLPREDNISOLONE SODIUM SUCC 125 MG IJ SOLR
40.0000 mg | INTRAMUSCULAR | Status: DC
  Administered 2011-11-14: 40 mg via INTRAVENOUS

## 2011-12-29 ENCOUNTER — Other Ambulatory Visit (HOSPITAL_COMMUNITY): Payer: Self-pay | Admitting: *Deleted

## 2012-01-02 ENCOUNTER — Encounter (HOSPITAL_COMMUNITY)
Admission: RE | Admit: 2012-01-02 | Discharge: 2012-01-02 | Disposition: A | Discharge status: Home / Self Care | Payer: BC Managed Care – PPO | Source: Ambulatory Visit | Attending: Gastroenterology | Admitting: Gastroenterology

## 2012-01-02 DIAGNOSIS — K5090 Crohn's disease, unspecified, without complications: Secondary | ICD-10-CM | POA: Insufficient documentation

## 2012-01-02 MED ORDER — DIPHENHYDRAMINE HCL 50 MG/ML IJ SOLN
50.0000 mg | INTRAMUSCULAR | Status: DC
  Administered 2012-01-02: 50 mg via INTRAVENOUS
  Filled 2012-01-02: qty 1

## 2012-01-02 MED ORDER — SODIUM CHLORIDE 0.9 % IV SOLN
7.5000 mg/kg | INTRAVENOUS | Status: DC
  Administered 2012-01-02: 600 mg via INTRAVENOUS
  Filled 2012-01-02: qty 60

## 2012-01-02 MED ORDER — SODIUM CHLORIDE 0.9 % IV SOLN: Freq: Once | INTRAVENOUS | Status: DC

## 2012-01-02 MED ORDER — METHYLPREDNISOLONE SODIUM SUCC 125 MG IJ SOLR
40.0000 mg | INTRAMUSCULAR | Status: DC
  Administered 2012-01-02: 40 mg via INTRAVENOUS
  Filled 2012-01-02: qty 2

## 2012-01-02 MED ORDER — ACETAMINOPHEN 325 MG PO TABS
650.0000 mg | ORAL_TABLET | ORAL | Status: DC
  Administered 2012-01-02: 650 mg via ORAL
  Filled 2012-01-02: qty 2

## 2012-02-19 ENCOUNTER — Other Ambulatory Visit (HOSPITAL_COMMUNITY): Payer: Self-pay | Admitting: *Deleted

## 2012-02-20 ENCOUNTER — Encounter (HOSPITAL_COMMUNITY)
Admission: RE | Admit: 2012-02-20 | Discharge: 2012-02-20 | Disposition: A | Discharge status: Home / Self Care | Payer: BC Managed Care – PPO | Source: Ambulatory Visit | Attending: Gastroenterology | Admitting: Gastroenterology

## 2012-02-20 DIAGNOSIS — K5090 Crohn's disease, unspecified, without complications: Secondary | ICD-10-CM | POA: Insufficient documentation

## 2012-02-20 LAB — CBC
Hemoglobin: 13.3 g/dL (ref 13.0–17.0)
MCH: 28.7 pg (ref 26.0–34.0)
MCHC: 33.8 g/dL (ref 30.0–36.0)
Platelets: 194 10*3/uL (ref 150–400)

## 2012-02-20 LAB — COMPREHENSIVE METABOLIC PANEL
ALT: 42 U/L (ref 0–53)
Calcium: 9 mg/dL (ref 8.4–10.5)
Creatinine, Ser: 0.83 mg/dL (ref 0.50–1.35)
GFR calc Af Amer: >90 mL/min (ref >90)
Glucose, Bld: 118 mg/dL — ABNORMAL HIGH (ref 70–99)
Sodium: 139 mEq/L (ref 135–145)
Total Protein: 6.7 g/dL (ref 6.0–8.3)

## 2012-02-20 LAB — DIFFERENTIAL
Eosinophils Relative: 3 % (ref 0–5)
Lymphocytes Relative: 25 % (ref 12–46)
Lymphs Abs: 1.3 10*3/uL (ref 0.7–4.0)
Neutro Abs: 3.2 10*3/uL (ref 1.7–7.7)

## 2012-02-20 MED ORDER — DIPHENHYDRAMINE HCL 50 MG/ML IJ SOLN
50.0000 mg | INTRAMUSCULAR | Status: DC
  Administered 2012-02-20: 50 mg via INTRAVENOUS
  Filled 2012-02-20: qty 1

## 2012-02-20 MED ORDER — SODIUM CHLORIDE 0.9 % IV SOLN
INTRAVENOUS | Status: DC
  Administered 2012-02-20: 09:00:00 via INTRAVENOUS

## 2012-02-20 MED ORDER — SODIUM CHLORIDE 0.9 % IV SOLN
7.5000 mg/kg | INTRAVENOUS | Status: DC
  Administered 2012-02-20: 600 mg via INTRAVENOUS
  Filled 2012-02-20: qty 60

## 2012-02-20 MED ORDER — ACETAMINOPHEN 325 MG PO TABS
650.0000 mg | ORAL_TABLET | ORAL | Status: DC
  Administered 2012-02-20: 650 mg via ORAL
  Filled 2012-02-20: qty 2

## 2012-02-20 MED ORDER — METHYLPREDNISOLONE SODIUM SUCC 125 MG IJ SOLR
40.0000 mg | INTRAMUSCULAR | Status: DC
  Administered 2012-02-20: 40 mg via INTRAVENOUS
  Filled 2012-02-20: qty 2

## 2012-04-06 ENCOUNTER — Other Ambulatory Visit (HOSPITAL_COMMUNITY): Payer: Self-pay | Admitting: *Deleted

## 2012-04-09 ENCOUNTER — Encounter (HOSPITAL_COMMUNITY)
Admission: RE | Admit: 2012-04-09 | Discharge: 2012-04-09 | Disposition: A | Discharge status: Home / Self Care | Payer: BC Managed Care – PPO | Source: Ambulatory Visit | Attending: Gastroenterology | Admitting: Gastroenterology

## 2012-04-09 DIAGNOSIS — K5090 Crohn's disease, unspecified, without complications: Secondary | ICD-10-CM | POA: Insufficient documentation

## 2012-04-09 MED ORDER — ACETAMINOPHEN 325 MG PO TABS
650.0000 mg | ORAL_TABLET | ORAL | Status: DC
  Administered 2012-04-09: 650 mg via ORAL
  Filled 2012-04-09: qty 2

## 2012-04-09 MED ORDER — DIPHENHYDRAMINE HCL 50 MG/ML IJ SOLN
50.0000 mg | INTRAMUSCULAR | Status: DC
  Administered 2012-04-09: 50 mg via INTRAVENOUS
  Filled 2012-04-09 (×2): qty 1

## 2012-04-09 MED ORDER — SODIUM CHLORIDE 0.9 % IV SOLN
7.5000 mg/kg | INTRAVENOUS | Status: DC
  Administered 2012-04-09: 600 mg via INTRAVENOUS
  Filled 2012-04-09: qty 60

## 2012-04-09 MED ORDER — METHYLPREDNISOLONE SODIUM SUCC 125 MG IJ SOLR: 40.0000 mg | INTRAMUSCULAR | Status: DC

## 2012-04-09 MED ORDER — METHYLPREDNISOLONE SODIUM SUCC 40 MG IJ SOLR
INTRAMUSCULAR | Status: AC
  Administered 2012-04-09: 40 mg
  Filled 2012-04-09: qty 1

## 2012-04-09 MED ORDER — SODIUM CHLORIDE 0.9 % IV SOLN
INTRAVENOUS | Status: DC
  Administered 2012-04-09: 09:00:00 via INTRAVENOUS

## 2012-05-27 ENCOUNTER — Other Ambulatory Visit (HOSPITAL_COMMUNITY): Payer: Self-pay | Admitting: *Deleted

## 2012-05-28 ENCOUNTER — Encounter (HOSPITAL_COMMUNITY)
Admission: RE | Admit: 2012-05-28 | Discharge: 2012-05-28 | Disposition: A | Discharge status: Home / Self Care | Payer: BC Managed Care – PPO | Source: Ambulatory Visit | Attending: Gastroenterology | Admitting: Gastroenterology

## 2012-05-28 DIAGNOSIS — K5090 Crohn's disease, unspecified, without complications: Secondary | ICD-10-CM | POA: Insufficient documentation

## 2012-05-28 LAB — DIFFERENTIAL
Basophils Relative: 1 % (ref 0–1)
Eosinophils Absolute: 0.1 10*3/uL (ref 0.0–0.7)
Neutrophils Relative %: 63 % (ref 43–77)

## 2012-05-28 LAB — COMPREHENSIVE METABOLIC PANEL
Alkaline Phosphatase: 77 U/L (ref 39–117)
BUN: 12 mg/dL (ref 6–23)
Chloride: 105 mEq/L (ref 96–112)
GFR calc Af Amer: >90 mL/min (ref >90)
GFR calc non Af Amer: >90 mL/min (ref >90)
Glucose, Bld: 109 mg/dL — ABNORMAL HIGH (ref 70–99)
Potassium: 3.9 mEq/L (ref 3.5–5.1)
Total Bilirubin: 0.2 mg/dL — ABNORMAL LOW (ref 0.3–1.2)

## 2012-05-28 LAB — CBC
HCT: 38.9 % — ABNORMAL LOW (ref 39.0–52.0)
Hemoglobin: 13 g/dL (ref 13.0–17.0)
MCHC: 33.4 g/dL (ref 30.0–36.0)

## 2012-05-28 MED ORDER — GLUCAGON HCL (RDNA) 1 MG IJ SOLR
INTRAMUSCULAR | Status: AC
  Filled 2012-05-28: qty 1

## 2012-05-28 MED ORDER — ACETAMINOPHEN 325 MG PO TABS
650.0000 mg | ORAL_TABLET | ORAL | Status: DC
  Administered 2012-05-28: 650 mg via ORAL
  Filled 2012-05-28: qty 2

## 2012-05-28 MED ORDER — DIPHENHYDRAMINE HCL 50 MG/ML IJ SOLN
50.0000 mg | INTRAMUSCULAR | Status: DC
  Administered 2012-05-28: 50 mg via INTRAVENOUS

## 2012-05-28 MED ORDER — SODIUM CHLORIDE 0.9 % IV SOLN
7.5000 mg/kg | INTRAVENOUS | Status: DC
  Administered 2012-05-28: 600 mg via INTRAVENOUS
  Filled 2012-05-28: qty 60

## 2012-05-28 MED ORDER — DIPHENHYDRAMINE HCL 50 MG/ML IJ SOLN
INTRAMUSCULAR | Status: AC
  Filled 2012-05-28: qty 1

## 2012-05-28 MED ORDER — METHYLPREDNISOLONE SODIUM SUCC 40 MG IJ SOLR
INTRAMUSCULAR | Status: AC
  Administered 2012-05-28: 40 mg via INTRAVENOUS
  Filled 2012-05-28: qty 1

## 2012-05-28 MED ORDER — SODIUM CHLORIDE 0.9 % IV SOLN
INTRAVENOUS | Status: DC
  Administered 2012-05-28: 09:00:00 via INTRAVENOUS

## 2012-05-28 MED ORDER — METHYLPREDNISOLONE SODIUM SUCC 125 MG IJ SOLR: 40.0000 mg | INTRAMUSCULAR | Status: DC

## 2012-05-31 MED FILL — Glucagon HCl (rDNA) For Inj 1 MG (Base Equiv): INTRAMUSCULAR | Qty: 1 | Status: AC

## 2012-07-16 ENCOUNTER — Encounter (HOSPITAL_COMMUNITY)
Admission: RE | Admit: 2012-07-16 | Discharge: 2012-07-16 | Disposition: A | Discharge status: Home / Self Care | Payer: BC Managed Care – PPO | Source: Ambulatory Visit | Attending: Gastroenterology | Admitting: Gastroenterology

## 2012-07-16 DIAGNOSIS — K5090 Crohn's disease, unspecified, without complications: Secondary | ICD-10-CM | POA: Insufficient documentation

## 2012-07-16 MED ORDER — INFLIXIMAB 100 MG IV SOLR
7.5000 mg/kg | INTRAVENOUS | Status: AC
  Administered 2012-07-16: 600 mg via INTRAVENOUS
  Filled 2012-07-16: qty 60

## 2012-07-16 MED ORDER — METHYLPREDNISOLONE SODIUM SUCC 125 MG IJ SOLR: 40.0000 mg | INTRAMUSCULAR | Status: DC

## 2012-07-16 MED ORDER — DIPHENHYDRAMINE HCL 50 MG/ML IJ SOLN
50.0000 mg | INTRAMUSCULAR | Status: DC
  Administered 2012-07-16: 50 mg via INTRAVENOUS

## 2012-07-16 MED ORDER — METHYLPREDNISOLONE SODIUM SUCC 40 MG IJ SOLR
INTRAMUSCULAR | Status: AC
  Administered 2012-07-16: 40 mg
  Filled 2012-07-16: qty 1

## 2012-07-16 MED ORDER — DIPHENHYDRAMINE HCL 50 MG/ML IJ SOLN
INTRAMUSCULAR | Status: AC
  Administered 2012-07-16: 50 mg via INTRAVENOUS
  Filled 2012-07-16: qty 1

## 2012-07-16 MED ORDER — ACETAMINOPHEN 325 MG PO TABS
ORAL_TABLET | ORAL | Status: AC
  Administered 2012-07-16: 650 mg via ORAL
  Filled 2012-07-16: qty 2

## 2012-07-16 MED ORDER — SODIUM CHLORIDE 0.9 % IV SOLN
INTRAVENOUS | Status: DC
  Administered 2012-07-16: 11:00:00 via INTRAVENOUS

## 2012-07-16 MED ORDER — ACETAMINOPHEN 325 MG PO TABS
650.0000 mg | ORAL_TABLET | ORAL | Status: DC
  Administered 2012-07-16: 650 mg via ORAL

## 2012-07-29 ENCOUNTER — Encounter (HOSPITAL_COMMUNITY): Payer: BC Managed Care – PPO

## 2012-09-02 ENCOUNTER — Other Ambulatory Visit (HOSPITAL_COMMUNITY): Payer: Self-pay | Admitting: *Deleted

## 2012-09-03 ENCOUNTER — Encounter (HOSPITAL_COMMUNITY)
Admission: RE | Admit: 2012-09-03 | Discharge: 2012-09-03 | Disposition: A | Discharge status: Home / Self Care | Payer: BC Managed Care – PPO | Source: Ambulatory Visit | Attending: Gastroenterology | Admitting: Gastroenterology

## 2012-09-03 DIAGNOSIS — K5090 Crohn's disease, unspecified, without complications: Secondary | ICD-10-CM | POA: Insufficient documentation

## 2012-09-03 LAB — DIFFERENTIAL
Eosinophils Absolute: 0.1 10*3/uL (ref 0.0–0.7)
Lymphocytes Relative: 22 % (ref 12–46)
Lymphs Abs: 1.6 10*3/uL (ref 0.7–4.0)
Monocytes Relative: 9 % (ref 3–12)
Neutro Abs: 4.8 10*3/uL (ref 1.7–7.7)
Neutrophils Relative %: 67 % (ref 43–77)

## 2012-09-03 LAB — COMPREHENSIVE METABOLIC PANEL
Albumin: 3.6 g/dL (ref 3.5–5.2)
BUN: 17 mg/dL (ref 6–23)
Calcium: 9.4 mg/dL (ref 8.4–10.5)
Creatinine, Ser: 0.97 mg/dL (ref 0.50–1.35)
GFR calc Af Amer: >90 mL/min (ref >90)
Glucose, Bld: 93 mg/dL (ref 70–99)
Potassium: 4.1 mEq/L (ref 3.5–5.1)
Total Protein: 7.2 g/dL (ref 6.0–8.3)

## 2012-09-03 LAB — CBC
HCT: 42.8 % (ref 39.0–52.0)
Hemoglobin: 14.4 g/dL (ref 13.0–17.0)
MCH: 29.3 pg (ref 26.0–34.0)
MCHC: 33.6 g/dL (ref 30.0–36.0)
RDW: 13.6 % (ref 11.5–15.5)

## 2012-09-03 MED ORDER — SODIUM CHLORIDE 0.9 % IV SOLN
7.5000 mg/kg | INTRAVENOUS | Status: AC
  Administered 2012-09-03: 600 mg via INTRAVENOUS
  Filled 2012-09-03: qty 60

## 2012-09-03 MED ORDER — SODIUM CHLORIDE 0.9 % IV SOLN
INTRAVENOUS | Status: DC
  Administered 2012-09-03: 10:00:00 via INTRAVENOUS

## 2012-09-03 MED ORDER — DIPHENHYDRAMINE HCL 50 MG/ML IJ SOLN
INTRAMUSCULAR | Status: AC
  Administered 2012-09-03: 50 mg via INTRAVENOUS
  Filled 2012-09-03: qty 1

## 2012-09-03 MED ORDER — METHYLPREDNISOLONE SODIUM SUCC 40 MG IJ SOLR
INTRAMUSCULAR | Status: AC
  Administered 2012-09-03: 40 mg
  Filled 2012-09-03: qty 1

## 2012-09-03 MED ORDER — DIPHENHYDRAMINE HCL 50 MG/ML IJ SOLN: 50.0000 mg | INTRAMUSCULAR | Status: DC

## 2012-09-03 MED ORDER — METHYLPREDNISOLONE SODIUM SUCC 125 MG IJ SOLR: 40.0000 mg | INTRAMUSCULAR | Status: DC

## 2012-09-03 MED ORDER — ACETAMINOPHEN 325 MG PO TABS
ORAL_TABLET | ORAL | Status: AC
  Filled 2012-09-03: qty 2

## 2012-09-03 MED ORDER — ACETAMINOPHEN 325 MG PO TABS
650.0000 mg | ORAL_TABLET | ORAL | Status: DC
  Administered 2012-09-03: 650 mg via ORAL

## 2012-10-21 ENCOUNTER — Other Ambulatory Visit (HOSPITAL_COMMUNITY): Payer: Self-pay | Admitting: *Deleted

## 2012-10-22 ENCOUNTER — Encounter (HOSPITAL_COMMUNITY)
Admission: RE | Admit: 2012-10-22 | Discharge: 2012-10-22 | Disposition: A | Discharge status: Home / Self Care | Payer: BC Managed Care – PPO | Source: Ambulatory Visit | Attending: Gastroenterology | Admitting: Gastroenterology

## 2012-10-22 DIAGNOSIS — K5090 Crohn's disease, unspecified, without complications: Secondary | ICD-10-CM | POA: Insufficient documentation

## 2012-10-22 LAB — CBC
HCT: 43.5 % (ref 39.0–52.0)
Hemoglobin: 15.2 g/dL (ref 13.0–17.0)
MCH: 30.3 pg (ref 26.0–34.0)
MCV: 86.8 fL (ref 78.0–100.0)
Platelets: 169 10*3/uL (ref 150–400)
RBC: 5.01 MIL/uL (ref 4.22–5.81)
WBC: 7.6 10*3/uL (ref 4.0–10.5)

## 2012-10-22 LAB — COMPREHENSIVE METABOLIC PANEL
AST: 28 U/L (ref 0–37)
CO2: 27 mEq/L (ref 19–32)
Chloride: 103 mEq/L (ref 96–112)
Creatinine, Ser: 1.04 mg/dL (ref 0.50–1.35)
GFR calc Af Amer: >90 mL/min (ref >90)
GFR calc non Af Amer: 86 mL/min — ABNORMAL LOW (ref >90)
Glucose, Bld: 80 mg/dL (ref 70–99)
Total Bilirubin: 0.5 mg/dL (ref 0.3–1.2)

## 2012-10-22 LAB — DIFFERENTIAL
Basophils Absolute: 0 10*3/uL (ref 0.0–0.1)
Basophils Relative: 1 % (ref 0–1)
Eosinophils Relative: 2 % (ref 0–5)
Monocytes Absolute: 0.7 10*3/uL (ref 0.1–1.0)
Neutro Abs: 4.6 10*3/uL (ref 1.7–7.7)

## 2012-10-22 MED ORDER — ACETAMINOPHEN 325 MG PO TABS
ORAL_TABLET | ORAL | Status: AC
  Administered 2012-10-22: 650 mg
  Filled 2012-10-22: qty 2

## 2012-10-22 MED ORDER — DIPHENHYDRAMINE HCL 50 MG/ML IJ SOLN: 50.0000 mg | Freq: Once | INTRAMUSCULAR | Status: DC

## 2012-10-22 MED ORDER — METHYLPREDNISOLONE SODIUM SUCC 40 MG IJ SOLR
INTRAMUSCULAR | Status: AC
  Administered 2012-10-22: 40 mg
  Filled 2012-10-22: qty 1

## 2012-10-22 MED ORDER — ACETAMINOPHEN 325 MG PO TABS: 650.0000 mg | ORAL_TABLET | Freq: Once | ORAL | Status: DC

## 2012-10-22 MED ORDER — SODIUM CHLORIDE 0.9 % IV SOLN
7.5000 mg/kg | Freq: Once | INTRAVENOUS | Status: AC
  Administered 2012-10-22: 600 mg via INTRAVENOUS
  Filled 2012-10-22: qty 60

## 2012-10-22 MED ORDER — SODIUM CHLORIDE 0.9 % IV SOLN: Freq: Once | INTRAVENOUS | Status: DC

## 2012-10-22 MED ORDER — METHYLPREDNISOLONE SODIUM SUCC 40 MG IJ SOLR: 40.0000 mg | Freq: Once | INTRAMUSCULAR | Status: DC

## 2012-10-22 MED ORDER — DIPHENHYDRAMINE HCL 50 MG/ML IJ SOLN
INTRAMUSCULAR | Status: AC
  Administered 2012-10-22: 50 mg
  Filled 2012-10-22: qty 1

## 2012-12-09 ENCOUNTER — Other Ambulatory Visit (HOSPITAL_COMMUNITY): Payer: Self-pay | Admitting: *Deleted

## 2012-12-10 ENCOUNTER — Encounter (HOSPITAL_COMMUNITY)
Admission: RE | Admit: 2012-12-10 | Discharge: 2012-12-10 | Disposition: A | Discharge status: Home / Self Care | Payer: BC Managed Care – PPO | Source: Ambulatory Visit | Attending: Gastroenterology | Admitting: Gastroenterology

## 2012-12-10 DIAGNOSIS — K5090 Crohn's disease, unspecified, without complications: Secondary | ICD-10-CM | POA: Insufficient documentation

## 2012-12-10 MED ORDER — SODIUM CHLORIDE 0.9 % IV SOLN: INTRAVENOUS | Status: DC

## 2012-12-10 MED ORDER — ACETAMINOPHEN 325 MG PO TABS
ORAL_TABLET | ORAL | Status: AC
  Filled 2012-12-10: qty 2

## 2012-12-10 MED ORDER — METHYLPREDNISOLONE SODIUM SUCC 40 MG IJ SOLR: 40.0000 mg | INTRAMUSCULAR | Status: DC

## 2012-12-10 MED ORDER — ACETAMINOPHEN 325 MG PO TABS
650.0000 mg | ORAL_TABLET | ORAL | Status: DC
  Administered 2012-12-10: 650 mg via ORAL

## 2012-12-10 MED ORDER — METHYLPREDNISOLONE SODIUM SUCC 40 MG IJ SOLR
INTRAMUSCULAR | Status: AC
  Filled 2012-12-10: qty 1

## 2012-12-10 MED ORDER — INFLIXIMAB 100 MG IV SOLR
7.5000 mg/kg | INTRAVENOUS | Status: DC
  Administered 2012-12-10: 600 mg via INTRAVENOUS
  Filled 2012-12-10: qty 60

## 2012-12-10 MED ORDER — DIPHENHYDRAMINE HCL 50 MG/ML IJ SOLN: 50.0000 mg | INTRAMUSCULAR | Status: DC

## 2012-12-10 MED ORDER — DIPHENHYDRAMINE HCL 50 MG/ML IJ SOLN
INTRAMUSCULAR | Status: AC
  Administered 2012-12-10: 50 mg
  Filled 2012-12-10: qty 1

## 2013-02-03 ENCOUNTER — Other Ambulatory Visit (HOSPITAL_COMMUNITY): Payer: Self-pay | Admitting: *Deleted

## 2013-02-04 ENCOUNTER — Encounter (HOSPITAL_COMMUNITY)
Admission: RE | Admit: 2013-02-04 | Discharge: 2013-02-04 | Disposition: A | Discharge status: Home / Self Care | Payer: BC Managed Care – PPO | Source: Ambulatory Visit | Attending: Gastroenterology | Admitting: Gastroenterology

## 2013-02-04 ENCOUNTER — Other Ambulatory Visit (HOSPITAL_COMMUNITY): Payer: Self-pay | Admitting: *Deleted

## 2013-02-04 DIAGNOSIS — K5090 Crohn's disease, unspecified, without complications: Secondary | ICD-10-CM | POA: Insufficient documentation

## 2013-02-04 LAB — DIFFERENTIAL
Basophils Absolute: 0 10*3/uL (ref 0.0–0.1)
Eosinophils Relative: 3 % (ref 0–5)
Lymphocytes Relative: 23 % (ref 12–46)

## 2013-02-04 LAB — CBC
HCT: 38.3 % — ABNORMAL LOW (ref 39.0–52.0)
Hemoglobin: 13.3 g/dL (ref 13.0–17.0)
MCH: 30 pg (ref 26.0–34.0)
MCV: 86.5 fL (ref 78.0–100.0)
RBC: 4.43 MIL/uL (ref 4.22–5.81)

## 2013-02-04 LAB — COMPREHENSIVE METABOLIC PANEL
ALT: 27 U/L (ref 0–53)
CO2: 21 mEq/L (ref 19–32)
Calcium: 8.4 mg/dL (ref 8.4–10.5)
Creatinine, Ser: 0.84 mg/dL (ref 0.50–1.35)
GFR calc Af Amer: >90 mL/min (ref >90)
GFR calc non Af Amer: >90 mL/min (ref >90)
Glucose, Bld: 94 mg/dL (ref 70–99)

## 2013-02-04 MED ORDER — ACETAMINOPHEN 325 MG PO TABS: 650.0000 mg | ORAL_TABLET | ORAL | Status: DC

## 2013-02-04 MED ORDER — METHYLPREDNISOLONE SODIUM SUCC 40 MG IJ SOLR: 40.0000 mg | INTRAMUSCULAR | Status: DC

## 2013-02-04 MED ORDER — DIPHENHYDRAMINE HCL 50 MG/ML IJ SOLN
INTRAMUSCULAR | Status: AC
  Administered 2013-02-04: 50 mg via INTRAVENOUS
  Filled 2013-02-04: qty 1

## 2013-02-04 MED ORDER — METHYLPREDNISOLONE SODIUM SUCC 125 MG IJ SOLR
INTRAMUSCULAR | Status: AC
  Administered 2013-02-04: 40 mg
  Filled 2013-02-04: qty 2

## 2013-02-04 MED ORDER — DIPHENHYDRAMINE HCL 50 MG/ML IJ SOLN: 50.0000 mg | INTRAMUSCULAR | Status: DC

## 2013-02-04 MED ORDER — ACETAMINOPHEN 325 MG PO TABS
ORAL_TABLET | ORAL | Status: AC
  Administered 2013-02-04: 650 mg via ORAL
  Filled 2013-02-04: qty 2

## 2013-02-04 MED ORDER — INFLIXIMAB 100 MG IV SOLR
7.5000 mg/kg | INTRAVENOUS | Status: DC
  Administered 2013-02-04: 600 mg via INTRAVENOUS
  Filled 2013-02-04: qty 60

## 2013-02-04 MED ORDER — SODIUM CHLORIDE 0.9 % IV SOLN
INTRAVENOUS | Status: DC
  Administered 2013-02-04: 10:00:00 via INTRAVENOUS

## 2013-03-31 ENCOUNTER — Other Ambulatory Visit (HOSPITAL_COMMUNITY): Payer: Self-pay | Admitting: *Deleted

## 2013-04-01 ENCOUNTER — Encounter (HOSPITAL_COMMUNITY)
Admission: RE | Admit: 2013-04-01 | Discharge: 2013-04-01 | Disposition: A | Discharge status: Home / Self Care | Payer: BC Managed Care – PPO | Source: Ambulatory Visit | Attending: Gastroenterology | Admitting: Gastroenterology

## 2013-04-01 DIAGNOSIS — K5090 Crohn's disease, unspecified, without complications: Secondary | ICD-10-CM | POA: Insufficient documentation

## 2013-04-01 MED ORDER — METHYLPREDNISOLONE SODIUM SUCC 40 MG IJ SOLR
INTRAMUSCULAR | Status: AC
  Administered 2013-04-01: 40 mg
  Filled 2013-04-01: qty 1

## 2013-04-01 MED ORDER — SODIUM CHLORIDE 0.9 % IV SOLN
INTRAVENOUS | Status: DC
  Administered 2013-04-01: 10:00:00 via INTRAVENOUS

## 2013-04-01 MED ORDER — INFLIXIMAB 100 MG IV SOLR
7.5000 mg/kg | INTRAVENOUS | Status: DC
  Administered 2013-04-01: 600 mg via INTRAVENOUS
  Filled 2013-04-01: qty 60

## 2013-04-01 MED ORDER — DIPHENHYDRAMINE HCL 50 MG/ML IJ SOLN: 50.0000 mg | INTRAMUSCULAR | Status: DC

## 2013-04-01 MED ORDER — METHYLPREDNISOLONE SODIUM SUCC 40 MG IJ SOLR: 40.0000 mg | INTRAMUSCULAR | Status: DC

## 2013-04-01 MED ORDER — ACETAMINOPHEN 325 MG PO TABS: 650.0000 mg | ORAL_TABLET | ORAL | Status: DC

## 2013-04-01 MED ORDER — DIPHENHYDRAMINE HCL 50 MG/ML IJ SOLN
INTRAMUSCULAR | Status: AC
  Administered 2013-04-01: 50 mg via INTRAVENOUS
  Filled 2013-04-01: qty 1

## 2013-04-01 MED ORDER — ACETAMINOPHEN 325 MG PO TABS
ORAL_TABLET | ORAL | Status: AC
  Administered 2013-04-01: 325 mg
  Filled 2013-04-01: qty 2

## 2013-05-26 ENCOUNTER — Other Ambulatory Visit (HOSPITAL_COMMUNITY): Payer: Self-pay | Admitting: *Deleted

## 2013-05-27 ENCOUNTER — Encounter (HOSPITAL_COMMUNITY)
Admission: RE | Admit: 2013-05-27 | Discharge: 2013-05-27 | Disposition: A | Discharge status: Home / Self Care | Payer: BC Managed Care – PPO | Source: Ambulatory Visit | Attending: Gastroenterology | Admitting: Gastroenterology

## 2013-05-27 DIAGNOSIS — K5090 Crohn's disease, unspecified, without complications: Secondary | ICD-10-CM | POA: Insufficient documentation

## 2013-05-27 LAB — COMPREHENSIVE METABOLIC PANEL
ALT: 23 U/L (ref 0–53)
Alkaline Phosphatase: 74 U/L (ref 39–117)
CO2: 23 mEq/L (ref 19–32)
GFR calc Af Amer: >90 mL/min (ref >90)
GFR calc non Af Amer: >90 mL/min (ref >90)
Glucose, Bld: 105 mg/dL — ABNORMAL HIGH (ref 70–99)
Potassium: 4 mEq/L (ref 3.5–5.1)
Sodium: 135 mEq/L (ref 135–145)

## 2013-05-27 LAB — CBC
Hemoglobin: 14.7 g/dL (ref 13.0–17.0)
MCH: 29.2 pg (ref 26.0–34.0)
RBC: 5.03 MIL/uL (ref 4.22–5.81)

## 2013-05-27 LAB — DIFFERENTIAL
Basophils Absolute: 0 10*3/uL (ref 0.0–0.1)
Eosinophils Absolute: 0.3 10*3/uL (ref 0.0–0.7)
Eosinophils Relative: 4 % (ref 0–5)

## 2013-05-27 MED ORDER — SODIUM CHLORIDE 0.9 % IV SOLN
7.5000 mg/kg | INTRAVENOUS | Status: DC
  Administered 2013-05-27: 600 mg via INTRAVENOUS
  Filled 2013-05-27: qty 60

## 2013-05-27 MED ORDER — DIPHENHYDRAMINE HCL 50 MG/ML IJ SOLN
INTRAMUSCULAR | Status: AC
  Administered 2013-05-27: 50 mg
  Filled 2013-05-27: qty 1

## 2013-05-27 MED ORDER — DIPHENHYDRAMINE HCL 50 MG/ML IJ SOLN: 50.0000 mg | INTRAMUSCULAR | Status: DC

## 2013-05-27 MED ORDER — ACETAMINOPHEN 325 MG PO TABS
ORAL_TABLET | ORAL | Status: AC
  Administered 2013-05-27: 650 mg
  Filled 2013-05-27: qty 2

## 2013-05-27 MED ORDER — ACETAMINOPHEN 325 MG PO TABS: 650.0000 mg | ORAL_TABLET | ORAL | Status: DC

## 2013-05-27 MED ORDER — METHYLPREDNISOLONE SODIUM SUCC 125 MG IJ SOLR: 40.0000 mg | INTRAMUSCULAR | Status: DC

## 2013-05-27 MED ORDER — SODIUM CHLORIDE 0.9 % IV SOLN
INTRAVENOUS | Status: DC
  Administered 2013-05-27: 10:00:00 via INTRAVENOUS

## 2013-05-27 MED ORDER — METHYLPREDNISOLONE SODIUM SUCC 40 MG IJ SOLR
INTRAMUSCULAR | Status: AC
  Administered 2013-05-27: 40 mg
  Filled 2013-05-27: qty 1

## 2013-07-22 ENCOUNTER — Other Ambulatory Visit (HOSPITAL_COMMUNITY): Payer: Self-pay | Admitting: *Deleted

## 2013-07-22 ENCOUNTER — Encounter (HOSPITAL_COMMUNITY)
Admission: RE | Admit: 2013-07-22 | Discharge: 2013-07-22 | Disposition: A | Discharge status: Home / Self Care | Payer: BC Managed Care – PPO | Source: Ambulatory Visit | Attending: Gastroenterology | Admitting: Gastroenterology

## 2013-07-22 DIAGNOSIS — K5090 Crohn's disease, unspecified, without complications: Secondary | ICD-10-CM | POA: Insufficient documentation

## 2013-07-22 MED ORDER — DIPHENHYDRAMINE HCL 50 MG/ML IJ SOLN
50.0000 mg | Freq: Once | INTRAMUSCULAR | Status: AC
  Administered 2013-07-22: 50 mg via INTRAVENOUS

## 2013-07-22 MED ORDER — DIPHENHYDRAMINE HCL 50 MG/ML IJ SOLN
INTRAMUSCULAR | Status: AC
  Filled 2013-07-22: qty 1

## 2013-07-22 MED ORDER — METHYLPREDNISOLONE SODIUM SUCC 40 MG IJ SOLR
INTRAMUSCULAR | Status: AC
  Administered 2013-07-22: 40 mg
  Filled 2013-07-22: qty 1

## 2013-07-22 MED ORDER — ACETAMINOPHEN 325 MG PO TABS
650.0000 mg | ORAL_TABLET | Freq: Four times a day (QID) | ORAL | Status: DC | PRN
  Administered 2013-07-22: 650 mg via ORAL

## 2013-07-22 MED ORDER — SODIUM CHLORIDE 0.9 % IV SOLN
7.5000 mg/kg | INTRAVENOUS | Status: DC
  Administered 2013-07-22: 600 mg via INTRAVENOUS
  Filled 2013-07-22: qty 60

## 2013-07-22 MED ORDER — ACETAMINOPHEN 325 MG PO TABS
ORAL_TABLET | ORAL | Status: AC
  Filled 2013-07-22: qty 2

## 2013-07-22 MED ORDER — METHYLPREDNISOLONE SODIUM SUCC 125 MG IJ SOLR: 40.0000 mg | Freq: Once | INTRAMUSCULAR | Status: DC

## 2013-07-22 MED ORDER — SODIUM CHLORIDE 0.9 % IV SOLN
INTRAVENOUS | Status: DC
  Administered 2013-07-22: 10:00:00 via INTRAVENOUS

## 2013-08-05 ENCOUNTER — Encounter (HOSPITAL_COMMUNITY): Payer: BC Managed Care – PPO

## 2013-09-14 ENCOUNTER — Other Ambulatory Visit (HOSPITAL_COMMUNITY): Payer: Self-pay

## 2013-09-16 ENCOUNTER — Encounter (HOSPITAL_COMMUNITY)
Admission: RE | Admit: 2013-09-16 | Discharge: 2013-09-16 | Disposition: A | Discharge status: Home / Self Care | Payer: BC Managed Care – PPO | Source: Ambulatory Visit | Attending: Gastroenterology | Admitting: Gastroenterology

## 2013-09-16 DIAGNOSIS — K5090 Crohn's disease, unspecified, without complications: Secondary | ICD-10-CM | POA: Insufficient documentation

## 2013-09-16 LAB — COMPREHENSIVE METABOLIC PANEL
ALK PHOS: 75 U/L (ref 39–117)
ALT: 39 U/L (ref 0–53)
AST: 34 U/L (ref 0–37)
Albumin: 3.8 g/dL (ref 3.5–5.2)
BILIRUBIN TOTAL: 0.3 mg/dL (ref 0.3–1.2)
BUN: 16 mg/dL (ref 6–23)
CHLORIDE: 104 meq/L (ref 96–112)
CO2: 22 mEq/L (ref 19–32)
CREATININE: 0.92 mg/dL (ref 0.50–1.35)
Calcium: 8.9 mg/dL (ref 8.4–10.5)
GFR calc non Af Amer: >90 mL/min (ref >90)
GLUCOSE: 130 mg/dL — AB (ref 70–99)
POTASSIUM: 4 meq/L (ref 3.7–5.3)
Sodium: 140 mEq/L (ref 137–147)
TOTAL PROTEIN: 7.4 g/dL (ref 6.0–8.3)

## 2013-09-16 LAB — CBC WITH DIFFERENTIAL/PLATELET
BASOS PCT: 0 % (ref 0–1)
Basophils Absolute: 0 10*3/uL (ref 0.0–0.1)
EOS ABS: 0.2 10*3/uL (ref 0.0–0.7)
Eosinophils Relative: 3 % (ref 0–5)
HEMATOCRIT: 41.9 % (ref 39.0–52.0)
HEMOGLOBIN: 14.6 g/dL (ref 13.0–17.0)
LYMPHS ABS: 1.7 10*3/uL (ref 0.7–4.0)
Lymphocytes Relative: 28 % (ref 12–46)
MCH: 29.9 pg (ref 26.0–34.0)
MCHC: 34.8 g/dL (ref 30.0–36.0)
MCV: 85.9 fL (ref 78.0–100.0)
MONO ABS: 0.5 10*3/uL (ref 0.1–1.0)
MONOS PCT: 8 % (ref 3–12)
NEUTROS PCT: 60 % (ref 43–77)
Neutro Abs: 3.6 10*3/uL (ref 1.7–7.7)
Platelets: 169 10*3/uL (ref 150–400)
RBC: 4.88 MIL/uL (ref 4.22–5.81)
RDW: 13.1 % (ref 11.5–15.5)
WBC: 5.9 10*3/uL (ref 4.0–10.5)

## 2013-09-16 MED ORDER — METHYLPREDNISOLONE SODIUM SUCC 125 MG IJ SOLR: 40.0000 mg | Freq: Once | INTRAMUSCULAR | Status: DC

## 2013-09-16 MED ORDER — METHYLPREDNISOLONE SODIUM SUCC 40 MG IJ SOLR
INTRAMUSCULAR | Status: AC
  Administered 2013-09-16: 40 mg
  Filled 2013-09-16: qty 1

## 2013-09-16 MED ORDER — ACETAMINOPHEN 325 MG PO TABS: 650.0000 mg | ORAL_TABLET | Freq: Four times a day (QID) | ORAL | Status: DC | PRN

## 2013-09-16 MED ORDER — SODIUM CHLORIDE 0.9 % IV SOLN
INTRAVENOUS | Status: DC
  Administered 2013-09-16: 09:00:00 via INTRAVENOUS

## 2013-09-16 MED ORDER — SODIUM CHLORIDE 0.9 % IV SOLN
7.5000 mg/kg | INTRAVENOUS | Status: DC
  Administered 2013-09-16: 600 mg via INTRAVENOUS
  Filled 2013-09-16: qty 60

## 2013-09-16 MED ORDER — DIPHENHYDRAMINE HCL 50 MG/ML IJ SOLN: 50.0000 mg | Freq: Once | INTRAMUSCULAR | Status: DC

## 2013-09-16 MED ORDER — DIPHENHYDRAMINE HCL 50 MG/ML IJ SOLN
INTRAMUSCULAR | Status: AC
  Administered 2013-09-16: 50 mg
  Filled 2013-09-16: qty 1

## 2013-09-16 MED ORDER — ACETAMINOPHEN 325 MG PO TABS
ORAL_TABLET | ORAL | Status: AC
  Administered 2013-09-16: 650 mg
  Filled 2013-09-16: qty 2

## 2013-09-22 ENCOUNTER — Telehealth: Payer: Self-pay

## 2013-09-26 MED ORDER — COLESEVELAM HCL 625 MG PO TABS: 1250.0000 mg | ORAL_TABLET | Freq: Two times a day (BID) | ORAL | Status: DC

## 2013-09-26 MED ORDER — NIACIN ER (ANTIHYPERLIPIDEMIC) 1000 MG PO TBCR: 2000.0000 mg | EXTENDED_RELEASE_TABLET | Freq: Every day | ORAL | Status: DC

## 2013-09-26 NOTE — Telephone Encounter (Signed)
Rx sent in for pt.

## 2013-10-15 ENCOUNTER — Encounter: Payer: Self-pay | Admitting: *Deleted

## 2013-10-15 DIAGNOSIS — K5090 Crohn's disease, unspecified, without complications: Secondary | ICD-10-CM | POA: Insufficient documentation

## 2013-10-15 DIAGNOSIS — M8580 Other specified disorders of bone density and structure, unspecified site: Secondary | ICD-10-CM

## 2013-11-09 ENCOUNTER — Other Ambulatory Visit (HOSPITAL_COMMUNITY): Payer: Self-pay | Admitting: *Deleted

## 2013-11-11 ENCOUNTER — Encounter: Payer: Self-pay | Admitting: Cardiology

## 2013-11-11 ENCOUNTER — Encounter (HOSPITAL_COMMUNITY): Payer: BC Managed Care – PPO

## 2013-11-18 ENCOUNTER — Encounter (HOSPITAL_COMMUNITY)
Admission: RE | Admit: 2013-11-18 | Discharge: 2013-11-18 | Disposition: A | Discharge status: Home / Self Care | Payer: BC Managed Care – PPO | Source: Ambulatory Visit | Attending: Gastroenterology | Admitting: Gastroenterology

## 2013-11-18 DIAGNOSIS — K5090 Crohn's disease, unspecified, without complications: Secondary | ICD-10-CM | POA: Insufficient documentation

## 2013-11-18 MED ORDER — ACETAMINOPHEN 325 MG PO TABS
ORAL_TABLET | ORAL | Status: AC
  Administered 2013-11-18: 650 mg
  Filled 2013-11-18: qty 2

## 2013-11-18 MED ORDER — ACETAMINOPHEN 325 MG PO TABS: 650.0000 mg | ORAL_TABLET | ORAL | Status: DC

## 2013-11-18 MED ORDER — DIPHENHYDRAMINE HCL 50 MG/ML IJ SOLN: 50.0000 mg | INTRAMUSCULAR | Status: DC

## 2013-11-18 MED ORDER — METHYLPREDNISOLONE SODIUM SUCC 40 MG IJ SOLR: 40.0000 mg | INTRAMUSCULAR | Status: DC

## 2013-11-18 MED ORDER — METHYLPREDNISOLONE SODIUM SUCC 40 MG IJ SOLR
INTRAMUSCULAR | Status: AC
  Administered 2013-11-18: 40 mg
  Filled 2013-11-18: qty 1

## 2013-11-18 MED ORDER — SODIUM CHLORIDE 0.9 % IV SOLN
7.5000 mg/kg | INTRAVENOUS | Status: DC
  Administered 2013-11-18: 600 mg via INTRAVENOUS
  Filled 2013-11-18: qty 60

## 2013-11-18 MED ORDER — SODIUM CHLORIDE 0.9 % IV SOLN
INTRAVENOUS | Status: DC
  Administered 2013-11-18: 10:00:00 via INTRAVENOUS

## 2013-11-18 MED ORDER — DIPHENHYDRAMINE HCL 50 MG/ML IJ SOLN
INTRAMUSCULAR | Status: AC
  Administered 2013-11-18: 50 mg
  Filled 2013-11-18: qty 1

## 2013-12-20 ENCOUNTER — Ambulatory Visit: Payer: BC Managed Care – PPO | Admitting: Cardiology

## 2013-12-28 ENCOUNTER — Ambulatory Visit (INDEPENDENT_AMBULATORY_CARE_PROVIDER_SITE_OTHER): Payer: BC Managed Care – PPO | Admitting: Cardiology

## 2013-12-28 ENCOUNTER — Encounter: Payer: Self-pay | Admitting: Cardiology

## 2013-12-28 VITALS — BP 120/76 | HR 68 | Ht 68.0 in | Wt 196.8 lb

## 2013-12-28 DIAGNOSIS — K5090 Crohn's disease, unspecified, without complications: Secondary | ICD-10-CM

## 2013-12-28 DIAGNOSIS — F17200 Nicotine dependence, unspecified, uncomplicated: Secondary | ICD-10-CM

## 2013-12-28 DIAGNOSIS — Z8249 Family history of ischemic heart disease and other diseases of the circulatory system: Secondary | ICD-10-CM

## 2013-12-28 DIAGNOSIS — E785 Hyperlipidemia, unspecified: Secondary | ICD-10-CM

## 2013-12-28 NOTE — Progress Notes (Signed)
[facility]. [address], [address] [place], [place]  [address] Phone: [phone] Fax:  [phone]  Date:  12/28/2013   ID:  Jose [name], DOB 09-28-1970, MRN [id number]  PCP:  No primary provider on file.   History of Present Illness: Jose [name] is a 43 y.o. male with Crohn's disease currently in remission with hyperlipidemia intolerant to statin use here for followup.   Nuclear stress 03/26/09-low risk, no ischemia, 10 minutes 45 seconds.  No chest pain, no syncope, no bleeding.  Brother had bypass surgery in 2015.  He is very interested in quitting smoking. He has taken Chantix in the past. He asked me to prescribe. I explained to him the slight increase in cardiovascular risk with this medication. He also had be had dreams with the medication. He is willing to take it again. He's not having any chest pain    Wt Readings from Last 3 Encounters:  12/28/13 196 lb 12.8 oz (89.268 kg)  11/18/13 195 lb (88.451 kg)  09/16/13 193 lb (87.544 kg)     Past Medical History  Diagnosis Date  . Crohn's disease   . Hyperlipidemia     No past surgical history on file.  Current Outpatient Prescriptions  Medication Sig Dispense Refill  . aspirin 81 MG tablet Take 162 mg by mouth daily.        . colesevelam (WELCHOL) 625 MG tablet Take 2 tablets (1,250 mg total) by mouth 2 (two) times daily with a meal.  360 tablet  3  . InFLIXimab (REMICADE IV) Inject into the vein every 8 (eight) weeks.        . niacin (NIASPAN) 1000 MG CR tablet Take 2 tablets (2,000 mg total) by mouth at bedtime.  180 tablet  3   No current facility-administered medications for this visit.    Allergies:    Allergies  Allergen Reactions  . Statins Other (See Comments)    Chest pain    Social History:  The patient  reports that he has been smoking.  He does not have any smokeless tobacco history on file. He reports that he does not drink alcohol or use illicit drugs.   ROS:  Please see the  history of present illness.   Denies any fevers, chills, orthopnea, PND   PHYSICAL EXAM: VS:  BP 120/76  Pulse 68  Ht 5' 8"  (1.727 m)  Wt 196 lb 12.8 oz (89.268 kg)  BMI 29.93 kg/m2 Well nourished, well developed, in no acute distress HEENT: normal Neck: no JVD Cardiac:  normal S1, S2; RRR; no murmur Lungs:  clear to auscultation bilaterally, no wheezing, rhonchi or rales Abd: soft, nontender, no hepatomegaly Ext: no edema Skin: warm and dry Neuro: no focal abnormalities noted  EKG: 12/28/13-sinus rhythm rate 68 with no other specific abnormalities, Labs: 09/16/13-creatinine 0.92, glucose 130, hemoglobin 14.6, LDL 106, small LDL particle size 520 within normal range.  ASSESSMENT AND PLAN:  1. Hyperlipidemia-medications reviewed. He has had statin intolerance. Currently on Niaspan, aspirin) flushing. Continue with exercise. Checking lipid panel. LDL small particle size excellent previously. 2. Family history of CAD-brother in 2015 had bypass surgery. He was a former smoker. Continue to modify risk factors. 3. Tobacco use-tobacco cessation discussed at length. This is the single most modifiable risk factor that is highly risky with regards to coronary vascular disease. He is trying E. cigarettes. He may try patch. He does not want to set himself up to fail. I have referred  him to [employer]. We have given him information. 4. One year followup. 5. Crohn's disease is stable on Q8 week Remicade.  Signed, [name], MD [facility]  12/28/2013 8:42 AM

## 2013-12-28 NOTE — Patient Instructions (Addendum)
Your physician recommends that you continue on your current medications as directed. Please refer to the Current Medication list given to you today.  Your physician recommends that you return for  Fasting lab work on 12/30/13 the lab is open from 7:30am-5:15pm   Your physician wants you to follow-up in: 1 year You will receive a reminder letter in the mail two months in advance. If you don't receive a letter, please call our office to schedule the follow-up appointment.   Smoking Cessation Quitting smoking is important to your health and has many advantages. However, it is not always easy to quit since nicotine is a very addictive drug. Often times, people try 3 times or more before being able to quit. This document explains the best ways for you to prepare to quit smoking. Quitting takes hard work and a lot of effort, but you can do it. ADVANTAGES OF QUITTING SMOKING  You will live longer, feel better, and live better.  Your body will feel the impact of quitting smoking almost immediately.  Within 20 minutes, blood pressure decreases. Your pulse returns to its normal level.  After 8 hours, carbon monoxide levels in the blood return to normal. Your oxygen level increases.  After 24 hours, the chance of having a heart attack starts to decrease. Your breath, hair, and body stop smelling like smoke.  After 48 hours, damaged nerve endings begin to recover. Your sense of taste and smell improve.  After 72 hours, the body is virtually free of nicotine. Your bronchial tubes relax and breathing becomes easier.  After 2 to 12 weeks, lungs can hold more air. Exercise becomes easier and circulation improves.  The risk of having a heart attack, stroke, cancer, or lung disease is greatly reduced.  After 1 year, the risk of coronary heart disease is cut in half.  After 5 years, the risk of stroke falls to the same as a nonsmoker.  After 10 years, the risk of lung cancer is cut in half and the risk  of other cancers decreases significantly.  After 15 years, the risk of coronary heart disease drops, usually to the level of a nonsmoker.  If you are pregnant, quitting smoking will improve your chances of having a healthy baby.  The people you live with, especially any children, will be healthier.  You will have extra money to spend on things other than cigarettes. QUESTIONS TO THINK ABOUT BEFORE ATTEMPTING TO QUIT You may want to talk about your answers with your caregiver.  Why do you want to quit?  If you tried to quit in the past, what helped and what did not?  What will be the most difficult situations for you after you quit? How will you plan to handle them?  Who can help you through the tough times? Your family? Friends? A caregiver?  What pleasures do you get from smoking? What ways can you still get pleasure if you quit? Here are some questions to ask your caregiver:  How can you help me to be successful at quitting?  What medicine do you think would be best for me and how should I take it?  What should I do if I need more help?  What is smoking withdrawal like? How can I get information on withdrawal? GET READY  Set a quit date.  Change your environment by getting rid of all cigarettes, ashtrays, matches, and lighters in your home, car, or work. Do not let people smoke in your home.  Review  your past attempts to quit. Think about what worked and what did not. GET SUPPORT AND ENCOURAGEMENT You have a better chance of being successful if you have help. You can get support in many ways.  Tell your family, friends, and co-workers that you are going to quit and need their support. Ask them not to smoke around you.  Get individual, group, or telephone counseling and support. Programs are available at [employer] and health centers. Call your local health department for information about programs in your area.  Spiritual beliefs and practices may help some smokers  quit.  Download a "quit meter" on your computer to keep track of quit statistics, such as how long you have gone without smoking, cigarettes not smoked, and money saved.  Get a self-help book about quitting smoking and staying off of tobacco. [place] yourself from urges to smoke. Talk to someone, go for a walk, or occupy your time with a task.  Change your normal routine. Take a different route to work. Drink tea instead of coffee. Eat breakfast in a different place.  Reduce your stress. Take a hot bath, exercise, or read a book.  Plan something enjoyable to do every day. Reward yourself for not smoking.  Explore interactive web-based programs that specialize in helping you quit. GET MEDICINE AND USE IT CORRECTLY Medicines can help you stop smoking and decrease the urge to smoke. Combining medicine with the above behavioral methods and support can greatly increase your chances of successfully quitting smoking.  Nicotine replacement therapy helps deliver nicotine to your body without the negative effects and risks of smoking. Nicotine replacement therapy includes nicotine gum, lozenges, inhalers, nasal sprays, and skin patches. Some may be available over-the-counter and others require a prescription.  Antidepressant medicine helps people abstain from smoking, but how this works is unknown. This medicine is available by prescription.  Nicotinic receptor partial agonist medicine simulates the effect of nicotine in your brain. This medicine is available by prescription. Ask your caregiver for advice about which medicines to use and how to use them based on your health history. Your caregiver will tell you what side effects to look out for if you choose to be on a medicine or therapy. Carefully read the information on the package. Do not use any other product containing nicotine while using a nicotine replacement product.  RELAPSE OR DIFFICULT SITUATIONS Most  relapses occur within the first 3 months after quitting. Do not be discouraged if you start smoking again. Remember, most people try several times before finally quitting. You may have symptoms of withdrawal because your body is used to nicotine. You may crave cigarettes, be irritable, feel very hungry, cough often, get headaches, or have difficulty concentrating. The withdrawal symptoms are only temporary. They are strongest when you first quit, but they will go away within 10 14 days. To reduce the chances of relapse, try to:  Avoid drinking alcohol. Drinking lowers your chances of successfully quitting.  Reduce the amount of caffeine you consume. Once you quit smoking, the amount of caffeine in your body increases and can give you symptoms, such as a rapid heartbeat, sweating, and anxiety.  Avoid smokers because they can make you want to smoke.  Do not let weight gain distract you. Many smokers will gain weight when they quit, usually less than 10 pounds. Eat a healthy diet and stay active. You can always lose the weight gained after you quit.  Find ways to improve  your mood other than smoking. FOR MORE INFORMATION  www.smokefree.gov  Document Released: 08/26/2001 Document Revised: 03/02/2012 Document Reviewed: 12/11/2011 [facility] Patient Information 2014 [place], [place].

## 2013-12-30 ENCOUNTER — Other Ambulatory Visit (INDEPENDENT_AMBULATORY_CARE_PROVIDER_SITE_OTHER): Payer: BC Managed Care – PPO

## 2013-12-30 DIAGNOSIS — E785 Hyperlipidemia, unspecified: Secondary | ICD-10-CM

## 2013-12-30 LAB — LIPID PANEL
CHOLESTEROL: 177 mg/dL (ref 0–200)
HDL: 33.6 mg/dL — ABNORMAL LOW (ref >39.00)
LDL Cholesterol: 115 mg/dL — ABNORMAL HIGH (ref 0–99)
Total CHOL/HDL Ratio: 5
Triglycerides: 142 mg/dL (ref 0.0–149.0)
VLDL: 28.4 mg/dL (ref 0.0–40.0)

## 2014-01-12 ENCOUNTER — Other Ambulatory Visit (HOSPITAL_COMMUNITY): Payer: Self-pay | Admitting: *Deleted

## 2014-01-13 ENCOUNTER — Encounter (HOSPITAL_COMMUNITY)
Admission: RE | Admit: 2014-01-13 | Discharge: 2014-01-13 | Disposition: A | Discharge status: Home / Self Care | Payer: BC Managed Care – PPO | Source: Ambulatory Visit | Attending: Gastroenterology | Admitting: Gastroenterology

## 2014-01-13 DIAGNOSIS — K5090 Crohn's disease, unspecified, without complications: Secondary | ICD-10-CM | POA: Insufficient documentation

## 2014-01-13 LAB — DIFFERENTIAL
BASOS ABS: 0 10*3/uL (ref 0.0–0.1)
BASOS PCT: 0 % (ref 0–1)
Eosinophils Absolute: 0.2 10*3/uL (ref 0.0–0.7)
Eosinophils Relative: 3 % (ref 0–5)
LYMPHS PCT: 23 % (ref 12–46)
Lymphs Abs: 1.3 10*3/uL (ref 0.7–4.0)
Monocytes Absolute: 0.7 10*3/uL (ref 0.1–1.0)
Monocytes Relative: 13 % — ABNORMAL HIGH (ref 3–12)
NEUTROS ABS: 3.4 10*3/uL (ref 1.7–7.7)
NEUTROS PCT: 61 % (ref 43–77)

## 2014-01-13 LAB — COMPREHENSIVE METABOLIC PANEL
ALT: 32 U/L (ref 0–53)
AST: 30 U/L (ref 0–37)
Albumin: 3.7 g/dL (ref 3.5–5.2)
Alkaline Phosphatase: 74 U/L (ref 39–117)
BILIRUBIN TOTAL: 0.4 mg/dL (ref 0.3–1.2)
BUN: 15 mg/dL (ref 6–23)
CALCIUM: 9.3 mg/dL (ref 8.4–10.5)
CHLORIDE: 104 meq/L (ref 96–112)
CO2: 23 meq/L (ref 19–32)
CREATININE: 1.05 mg/dL (ref 0.50–1.35)
GFR, EST NON AFRICAN AMERICAN: 85 mL/min — AB (ref >90)
Glucose, Bld: 104 mg/dL — ABNORMAL HIGH (ref 70–99)
Potassium: 4.2 mEq/L (ref 3.7–5.3)
Sodium: 140 mEq/L (ref 137–147)
Total Protein: 7.4 g/dL (ref 6.0–8.3)

## 2014-01-13 LAB — CBC
HEMATOCRIT: 42.5 % (ref 39.0–52.0)
Hemoglobin: 14.4 g/dL (ref 13.0–17.0)
MCH: 29.4 pg (ref 26.0–34.0)
MCHC: 33.9 g/dL (ref 30.0–36.0)
MCV: 86.9 fL (ref 78.0–100.0)
PLATELETS: 178 10*3/uL (ref 150–400)
RBC: 4.89 MIL/uL (ref 4.22–5.81)
RDW: 13 % (ref 11.5–15.5)
WBC: 5.6 10*3/uL (ref 4.0–10.5)

## 2014-01-13 MED ORDER — DIPHENHYDRAMINE HCL 50 MG/ML IJ SOLN
50.0000 mg | Freq: Once | INTRAMUSCULAR | Status: AC
  Administered 2014-01-13: 50 mg via INTRAVENOUS

## 2014-01-13 MED ORDER — METHYLPREDNISOLONE SODIUM SUCC 40 MG IJ SOLR
INTRAMUSCULAR | Status: AC
  Administered 2014-01-13: 40 mg
  Filled 2014-01-13: qty 1

## 2014-01-13 MED ORDER — SODIUM CHLORIDE 0.9 % IV SOLN
7.5000 mg/kg | INTRAVENOUS | Status: DC
  Administered 2014-01-13: 700 mg via INTRAVENOUS
  Filled 2014-01-13: qty 70

## 2014-01-13 MED ORDER — METHYLPREDNISOLONE SODIUM SUCC 40 MG IJ SOLR
INTRAMUSCULAR | Status: AC
  Filled 2014-01-13: qty 1

## 2014-01-13 MED ORDER — SODIUM CHLORIDE 0.9 % IV SOLN
INTRAVENOUS | Status: DC
  Administered 2014-01-13: 09:00:00 via INTRAVENOUS

## 2014-01-13 MED ORDER — ACETAMINOPHEN 325 MG PO TABS
ORAL_TABLET | ORAL | Status: AC
  Administered 2014-01-13: 650 mg via ORAL
  Filled 2014-01-13: qty 2

## 2014-01-13 MED ORDER — METHYLPREDNISOLONE SODIUM SUCC 125 MG IJ SOLR: 40.0000 mg | Freq: Once | INTRAMUSCULAR | Status: DC

## 2014-01-13 MED ORDER — DIPHENHYDRAMINE HCL 50 MG/ML IJ SOLN
INTRAMUSCULAR | Status: DC
Start: 2014-01-13 — End: 2014-01-13
  Filled 2014-01-13: qty 1

## 2014-01-13 MED ORDER — DIPHENHYDRAMINE HCL 50 MG/ML IJ SOLN
INTRAMUSCULAR | Status: AC
  Administered 2014-01-13: 50 mg via INTRAVENOUS
  Filled 2014-01-13: qty 1

## 2014-01-13 MED ORDER — ACETAMINOPHEN 325 MG PO TABS
650.0000 mg | ORAL_TABLET | Freq: Four times a day (QID) | ORAL | Status: DC | PRN
  Administered 2014-01-13: 650 mg via ORAL

## 2014-03-03 ENCOUNTER — Encounter (HOSPITAL_BASED_OUTPATIENT_CLINIC_OR_DEPARTMENT_OTHER): Payer: Self-pay | Admitting: Emergency Medicine

## 2014-03-03 ENCOUNTER — Emergency Department (HOSPITAL_BASED_OUTPATIENT_CLINIC_OR_DEPARTMENT_OTHER)
Admission: EM | Admit: 2014-03-03 | Discharge: 2014-03-03 | Disposition: A | Discharge status: Home / Self Care | Payer: BC Managed Care – PPO | Attending: Emergency Medicine | Admitting: Emergency Medicine

## 2014-03-03 DIAGNOSIS — Y9389 Activity, other specified: Secondary | ICD-10-CM | POA: Insufficient documentation

## 2014-03-03 DIAGNOSIS — Y9289 Other specified places as the place of occurrence of the external cause: Secondary | ICD-10-CM | POA: Insufficient documentation

## 2014-03-03 DIAGNOSIS — W268XXA Contact with other sharp object(s), not elsewhere classified, initial encounter: Secondary | ICD-10-CM | POA: Insufficient documentation

## 2014-03-03 DIAGNOSIS — S61209A Unspecified open wound of unspecified finger without damage to nail, initial encounter: Secondary | ICD-10-CM | POA: Insufficient documentation

## 2014-03-03 DIAGNOSIS — E785 Hyperlipidemia, unspecified: Secondary | ICD-10-CM | POA: Insufficient documentation

## 2014-03-03 DIAGNOSIS — F17200 Nicotine dependence, unspecified, uncomplicated: Secondary | ICD-10-CM | POA: Insufficient documentation

## 2014-03-03 DIAGNOSIS — S61012A Laceration without foreign body of left thumb without damage to nail, initial encounter: Secondary | ICD-10-CM

## 2014-03-03 DIAGNOSIS — K5090 Crohn's disease, unspecified, without complications: Secondary | ICD-10-CM | POA: Insufficient documentation

## 2014-03-03 DIAGNOSIS — Z23 Encounter for immunization: Secondary | ICD-10-CM | POA: Insufficient documentation

## 2014-03-03 DIAGNOSIS — Z79899 Other long term (current) drug therapy: Secondary | ICD-10-CM | POA: Insufficient documentation

## 2014-03-03 DIAGNOSIS — Z7982 Long term (current) use of aspirin: Secondary | ICD-10-CM | POA: Insufficient documentation

## 2014-03-03 MED ORDER — TETANUS-DIPHTH-ACELL PERTUSSIS 5-2.5-18.5 LF-MCG/0.5 IM SUSP
0.5000 mL | Freq: Once | INTRAMUSCULAR | Status: AC
  Administered 2014-03-03: 0.5 mL via INTRAMUSCULAR
  Filled 2014-03-03: qty 0.5

## 2014-03-03 NOTE — ED Provider Notes (Signed)
CSN: [id number]     Arrival date & time 03/03/14  2006 History   First MD Initiated Contact with Patient 03/03/14 2038     Chief Complaint  Patient presents with  . Extremity Laceration     (Consider location/radiation/quality/duration/timing/severity/associated sxs/prior Treatment) Patient is a 43 y.o. male presenting with skin laceration. The history is provided by the patient. No language interpreter was used.  Laceration Location:  Hand Hand laceration location:  L hand Length (cm):  1.5 Depth:  Cutaneous Bleeding: venous   Time since incident:  2 hours Laceration mechanism:  Broken glass Pain details:    Quality:  Aching   Severity:  No pain Foreign body present:  No foreign bodies Relieved by:  Nothing Worsened by:  Nothing tried Ineffective treatments:  None tried Tetanus status:  Out of date   Past Medical History  Diagnosis Date  . Crohn's disease   . Hyperlipidemia    History reviewed. No pertinent past surgical history. Family History  Problem Relation Age of Onset  . Heart disease Mother    History  Substance Use Topics  . Smoking status: Current Every Day Smoker -- 0.50 packs/day    Types: Cigarettes  . Smokeless tobacco: Not on file  . Alcohol Use: No    Review of Systems  All other systems reviewed and are negative.     Allergies  Statins  Home Medications   Prior to Admission medications   Medication Sig Start Date End Date Taking? Authorizing Provider  aspirin 81 MG tablet Take 162 mg by mouth daily.      Historical Provider, MD  colesevelam (WELCHOL) 625 MG tablet Take 2 tablets (1,250 mg total) by mouth 2 (two) times daily with a meal. 09/26/13   [name], MD  InFLIXimab (REMICADE IV) Inject into the vein every 8 (eight) weeks.      Historical Provider, MD  niacin (NIASPAN) 1000 MG CR tablet Take 2 tablets (2,000 mg total) by mouth at bedtime. 09/26/13   [name], MD   BP 119/67  Pulse 69  Temp(Src) 98.3 F (36.8 C) (Oral)   Resp 16  Ht 5\' 8"  (1.727 m)  Wt 190 lb (86.183 kg)  BMI 28.90 kg/m2  SpO2 99% Physical Exam  Nursing note and vitals reviewed. Constitutional: He is oriented to person, place, and time. He appears well-developed and well-nourished.  HENT:  Head: Normocephalic.  Eyes: EOM are normal.  Neck: Normal range of motion.  Pulmonary/Chest: Effort normal.  Abdominal: He exhibits no distension.  Musculoskeletal: Normal range of motion.  Neurological: He is alert and oriented to person, place, and time.  Skin:  1.5 cm  Psychiatric: He has a normal mood and affect.    ED Course  LACERATION REPAIR Date/Time: 03/03/2014 9:55 PM Performed by: Fransico Meadow Authorized by: Fransico Meadow Consent: Verbal consent obtained. Consent given by: patient Patient understanding: patient states understanding of the procedure being performed Patient identity confirmed: verbally with patient Body area: upper extremity Location details: left thumb Laceration length: 1.5 cm Foreign bodies: no foreign bodies Tendon involvement: none Nerve involvement: none Vascular damage: no Anesthesia: local infiltration Local anesthetic: lidocaine 2% without epinephrine Preparation: Patient was prepped and draped in the usual sterile fashion. Irrigation solution: saline Amount of cleaning: standard Debridement: none Degree of undermining: none Skin closure: 5-0 Prolene Number of sutures: 4 Technique: simple Approximation: loose Approximation difficulty: simple Patient tolerance: Patient tolerated the procedure well with no immediate complications.   (including critical care  time) Labs Review Labs Reviewed - No data to display  Imaging Review No results found.   EKG Interpretation None      MDM   Final diagnoses:  Laceration of thumb, left, initial encounter    Suture removal in 8 days    Fransico Meadow, PA-C 03/03/14 2156

## 2014-03-03 NOTE — Discharge Instructions (Signed)

## 2014-03-03 NOTE — ED Provider Notes (Signed)
Medical screening examination/treatment/procedure(s) were performed by non-physician practitioner and as supervising physician I was immediately available for consultation/collaboration.    Dot Lanes, MD 03/03/14 [phone]

## 2014-03-03 NOTE — ED Notes (Signed)
Lac to left thumb on broken ceramic toilet.

## 2014-03-09 ENCOUNTER — Other Ambulatory Visit (HOSPITAL_COMMUNITY): Payer: Self-pay | Admitting: *Deleted

## 2014-03-10 ENCOUNTER — Encounter (HOSPITAL_COMMUNITY)
Admission: RE | Admit: 2014-03-10 | Discharge: 2014-03-10 | Disposition: A | Discharge status: Home / Self Care | Payer: BC Managed Care – PPO | Source: Ambulatory Visit | Attending: Gastroenterology | Admitting: Gastroenterology

## 2014-03-10 DIAGNOSIS — K5090 Crohn's disease, unspecified, without complications: Secondary | ICD-10-CM | POA: Insufficient documentation

## 2014-03-10 MED ORDER — SODIUM CHLORIDE 0.9 % IV SOLN
7.5000 mg/kg | INTRAVENOUS | Status: DC
  Administered 2014-03-10: 600 mg via INTRAVENOUS
  Filled 2014-03-10: qty 60

## 2014-03-10 MED ORDER — METHYLPREDNISOLONE SODIUM SUCC 40 MG IJ SOLR
INTRAMUSCULAR | Status: AC
  Administered 2014-03-10: 40 mg
  Filled 2014-03-10: qty 1

## 2014-03-10 MED ORDER — DIPHENHYDRAMINE HCL 50 MG/ML IJ SOLN: 50.0000 mg | INTRAMUSCULAR | Status: DC

## 2014-03-10 MED ORDER — DIPHENHYDRAMINE HCL 50 MG/ML IJ SOLN
INTRAMUSCULAR | Status: AC
  Administered 2014-03-10: 50 mg
  Filled 2014-03-10: qty 1

## 2014-03-10 MED ORDER — SODIUM CHLORIDE 0.9 % IV SOLN
INTRAVENOUS | Status: DC
  Administered 2014-03-10: 09:00:00 via INTRAVENOUS

## 2014-03-10 MED ORDER — ACETAMINOPHEN 325 MG PO TABS: 650.0000 mg | ORAL_TABLET | ORAL | Status: DC

## 2014-03-10 MED ORDER — METHYLPREDNISOLONE SODIUM SUCC 40 MG IJ SOLR: 40.0000 mg | INTRAMUSCULAR | Status: DC

## 2014-03-10 MED ORDER — ACETAMINOPHEN 325 MG PO TABS
ORAL_TABLET | ORAL | Status: AC
  Administered 2014-03-10: 650 mg
  Filled 2014-03-10: qty 2

## 2014-05-05 ENCOUNTER — Encounter (HOSPITAL_COMMUNITY): Payer: BC Managed Care – PPO

## 2014-05-11 ENCOUNTER — Other Ambulatory Visit (HOSPITAL_COMMUNITY): Payer: Self-pay | Admitting: *Deleted

## 2014-05-12 ENCOUNTER — Encounter (HOSPITAL_COMMUNITY)
Admission: RE | Admit: 2014-05-12 | Discharge: 2014-05-12 | Disposition: A | Discharge status: Home / Self Care | Payer: BC Managed Care – PPO | Source: Ambulatory Visit | Attending: Gastroenterology | Admitting: Gastroenterology

## 2014-05-12 DIAGNOSIS — K5090 Crohn's disease, unspecified, without complications: Secondary | ICD-10-CM | POA: Diagnosis not present

## 2014-05-12 LAB — CBC
HEMATOCRIT: 41.1 % (ref 39.0–52.0)
HEMOGLOBIN: 13.7 g/dL (ref 13.0–17.0)
MCH: 29.3 pg (ref 26.0–34.0)
MCHC: 33.3 g/dL (ref 30.0–36.0)
MCV: 87.8 fL (ref 78.0–100.0)
Platelets: 163 10*3/uL (ref 150–400)
RBC: 4.68 MIL/uL (ref 4.22–5.81)
RDW: 13.2 % (ref 11.5–15.5)
WBC: 5.3 10*3/uL (ref 4.0–10.5)

## 2014-05-12 LAB — DIFFERENTIAL
Basophils Absolute: 0 10*3/uL (ref 0.0–0.1)
Basophils Relative: 0 % (ref 0–1)
Eosinophils Absolute: 0.3 10*3/uL (ref 0.0–0.7)
Eosinophils Relative: 5 % (ref 0–5)
LYMPHS ABS: 1.3 10*3/uL (ref 0.7–4.0)
Lymphocytes Relative: 25 % (ref 12–46)
MONO ABS: 0.6 10*3/uL (ref 0.1–1.0)
MONOS PCT: 11 % (ref 3–12)
Neutro Abs: 3.1 10*3/uL (ref 1.7–7.7)
Neutrophils Relative %: 59 % (ref 43–77)

## 2014-05-12 LAB — COMPREHENSIVE METABOLIC PANEL
ALBUMIN: 3.5 g/dL (ref 3.5–5.2)
ALK PHOS: 66 U/L (ref 39–117)
ALT: 27 U/L (ref 0–53)
AST: 28 U/L (ref 0–37)
Anion gap: 11 (ref 5–15)
BUN: 13 mg/dL (ref 6–23)
CO2: 23 mEq/L (ref 19–32)
Calcium: 9 mg/dL (ref 8.4–10.5)
Chloride: 105 mEq/L (ref 96–112)
Creatinine, Ser: 0.97 mg/dL (ref 0.50–1.35)
GFR calc Af Amer: >90 mL/min (ref >90)
GFR calc non Af Amer: >90 mL/min (ref >90)
Glucose, Bld: 92 mg/dL (ref 70–99)
POTASSIUM: 4 meq/L (ref 3.7–5.3)
Sodium: 139 mEq/L (ref 137–147)
TOTAL PROTEIN: 6.8 g/dL (ref 6.0–8.3)
Total Bilirubin: <0.2 mg/dL — ABNORMAL LOW (ref 0.3–1.2)

## 2014-05-12 MED ORDER — SODIUM CHLORIDE 0.9 % IV SOLN
INTRAVENOUS | Status: AC
  Administered 2014-05-12: 09:00:00 via INTRAVENOUS

## 2014-05-12 MED ORDER — METHYLPREDNISOLONE SODIUM SUCC 40 MG IJ SOLR
INTRAMUSCULAR | Status: AC
  Administered 2014-05-12: 40 mg
  Filled 2014-05-12: qty 1

## 2014-05-12 MED ORDER — ACETAMINOPHEN 325 MG PO TABS
650.0000 mg | ORAL_TABLET | ORAL | Status: AC
  Administered 2014-05-12: 650 mg via ORAL

## 2014-05-12 MED ORDER — SODIUM CHLORIDE 0.9 % IV SOLN
7.5000 mg/kg | INTRAVENOUS | Status: AC
  Administered 2014-05-12: 600 mg via INTRAVENOUS
  Filled 2014-05-12: qty 60

## 2014-05-12 MED ORDER — DIPHENHYDRAMINE HCL 50 MG/ML IJ SOLN
50.0000 mg | INTRAMUSCULAR | Status: AC
  Administered 2014-05-12: 50 mg via INTRAVENOUS

## 2014-05-12 MED ORDER — METHYLPREDNISOLONE SODIUM SUCC 40 MG IJ SOLR: 40.0000 mg | INTRAMUSCULAR | Status: DC

## 2014-05-12 MED ORDER — ACETAMINOPHEN 325 MG PO TABS
ORAL_TABLET | ORAL | Status: AC
  Filled 2014-05-12: qty 2

## 2014-05-12 MED ORDER — DIPHENHYDRAMINE HCL 50 MG/ML IJ SOLN
INTRAMUSCULAR | Status: AC
  Filled 2014-05-12: qty 1

## 2014-07-07 ENCOUNTER — Encounter (HOSPITAL_COMMUNITY): Payer: BC Managed Care – PPO

## 2014-07-13 ENCOUNTER — Other Ambulatory Visit (HOSPITAL_COMMUNITY): Payer: Self-pay | Admitting: *Deleted

## 2014-07-14 ENCOUNTER — Encounter (HOSPITAL_COMMUNITY)
Admission: RE | Admit: 2014-07-14 | Discharge: 2014-07-14 | Disposition: A | Discharge status: Home / Self Care | Payer: BC Managed Care – PPO | Source: Ambulatory Visit | Attending: Gastroenterology | Admitting: Gastroenterology

## 2014-07-14 DIAGNOSIS — M8580 Other specified disorders of bone density and structure, unspecified site: Secondary | ICD-10-CM | POA: Diagnosis not present

## 2014-07-14 DIAGNOSIS — K5090 Crohn's disease, unspecified, without complications: Secondary | ICD-10-CM | POA: Diagnosis present

## 2014-07-14 MED ORDER — DIPHENHYDRAMINE HCL 50 MG/ML IJ SOLN
INTRAMUSCULAR | Status: AC
  Filled 2014-07-14: qty 1

## 2014-07-14 MED ORDER — ACETAMINOPHEN 325 MG PO TABS
ORAL_TABLET | ORAL | Status: AC
  Filled 2014-07-14: qty 2

## 2014-07-14 MED ORDER — METHYLPREDNISOLONE SODIUM SUCC 125 MG IJ SOLR: 40.0000 mg | Freq: Once | INTRAMUSCULAR | Status: DC

## 2014-07-14 MED ORDER — SODIUM CHLORIDE 0.9 % IV SOLN
7.5000 mg/kg | INTRAVENOUS | Status: DC
  Administered 2014-07-14: 700 mg via INTRAVENOUS
  Filled 2014-07-14: qty 70

## 2014-07-14 MED ORDER — DIPHENHYDRAMINE HCL 50 MG/ML IJ SOLN
50.0000 mg | Freq: Once | INTRAMUSCULAR | Status: AC
  Administered 2014-07-14: 50 mg via INTRAVENOUS

## 2014-07-14 MED ORDER — METHYLPREDNISOLONE SODIUM SUCC 40 MG IJ SOLR
INTRAMUSCULAR | Status: AC
  Administered 2014-07-14: 40 mg
  Filled 2014-07-14: qty 1

## 2014-07-14 MED ORDER — ACETAMINOPHEN 325 MG PO TABS
650.0000 mg | ORAL_TABLET | Freq: Once | ORAL | Status: AC
  Administered 2014-07-14: 650 mg via ORAL

## 2014-09-05 ENCOUNTER — Other Ambulatory Visit (HOSPITAL_COMMUNITY): Payer: Self-pay | Admitting: *Deleted

## 2014-09-06 ENCOUNTER — Encounter (HOSPITAL_COMMUNITY)
Admission: RE | Admit: 2014-09-06 | Discharge: 2014-09-06 | Disposition: A | Discharge status: Home / Self Care | Payer: BC Managed Care – PPO | Source: Ambulatory Visit | Attending: Gastroenterology | Admitting: Gastroenterology

## 2014-09-06 DIAGNOSIS — K5090 Crohn's disease, unspecified, without complications: Secondary | ICD-10-CM | POA: Insufficient documentation

## 2014-09-06 DIAGNOSIS — M8580 Other specified disorders of bone density and structure, unspecified site: Secondary | ICD-10-CM | POA: Insufficient documentation

## 2014-09-06 LAB — CBC WITH DIFFERENTIAL/PLATELET
Basophils Absolute: 0 10*3/uL (ref 0.0–0.1)
Basophils Relative: 0 % (ref 0–1)
Eosinophils Absolute: 0.3 10*3/uL (ref 0.0–0.7)
Eosinophils Relative: 4 % (ref 0–5)
HCT: 41.2 % (ref 39.0–52.0)
HEMOGLOBIN: 14.1 g/dL (ref 13.0–17.0)
LYMPHS ABS: 1.7 10*3/uL (ref 0.7–4.0)
LYMPHS PCT: 27 % (ref 12–46)
MCH: 29.4 pg (ref 26.0–34.0)
MCHC: 34.2 g/dL (ref 30.0–36.0)
MCV: 86 fL (ref 78.0–100.0)
MONOS PCT: 9 % (ref 3–12)
Monocytes Absolute: 0.6 10*3/uL (ref 0.1–1.0)
NEUTROS ABS: 3.8 10*3/uL (ref 1.7–7.7)
Neutrophils Relative %: 60 % (ref 43–77)
Platelets: 192 10*3/uL (ref 150–400)
RBC: 4.79 MIL/uL (ref 4.22–5.81)
RDW: 13.2 % (ref 11.5–15.5)
WBC: 6.4 10*3/uL (ref 4.0–10.5)

## 2014-09-06 LAB — COMPREHENSIVE METABOLIC PANEL
ALBUMIN: 3.9 g/dL (ref 3.5–5.2)
ALK PHOS: 63 U/L (ref 39–117)
ALT: 34 U/L (ref 0–53)
ANION GAP: 8 (ref 5–15)
AST: 39 U/L — ABNORMAL HIGH (ref 0–37)
BILIRUBIN TOTAL: 0.4 mg/dL (ref 0.3–1.2)
BUN: 12 mg/dL (ref 6–23)
CHLORIDE: 103 meq/L (ref 96–112)
CO2: 23 mmol/L (ref 19–32)
Calcium: 9.1 mg/dL (ref 8.4–10.5)
Creatinine, Ser: 1.15 mg/dL (ref 0.50–1.35)
GFR calc Af Amer: 87 mL/min — ABNORMAL LOW (ref >90)
GFR calc non Af Amer: 75 mL/min — ABNORMAL LOW (ref >90)
Glucose, Bld: 107 mg/dL — ABNORMAL HIGH (ref 70–99)
POTASSIUM: 4.2 mmol/L (ref 3.5–5.1)
Sodium: 134 mmol/L — ABNORMAL LOW (ref 135–145)
Total Protein: 6.8 g/dL (ref 6.0–8.3)

## 2014-09-06 MED ORDER — DIPHENHYDRAMINE HCL 50 MG/ML IJ SOLN
INTRAMUSCULAR | Status: AC
  Administered 2014-09-06: 50 mg via INTRAVENOUS
  Filled 2014-09-06: qty 1

## 2014-09-06 MED ORDER — ACETAMINOPHEN 325 MG PO TABS
650.0000 mg | ORAL_TABLET | Freq: Once | ORAL | Status: AC
  Administered 2014-09-06: 650 mg via ORAL

## 2014-09-06 MED ORDER — DIPHENHYDRAMINE HCL 50 MG/ML IJ SOLN
50.0000 mg | Freq: Once | INTRAMUSCULAR | Status: AC
  Administered 2014-09-06: 50 mg via INTRAVENOUS

## 2014-09-06 MED ORDER — SODIUM CHLORIDE 0.9 % IV SOLN
INTRAVENOUS | Status: DC
  Administered 2014-09-06: 09:00:00 via INTRAVENOUS

## 2014-09-06 MED ORDER — SODIUM CHLORIDE 0.9 % IV SOLN
7.5000 mg/kg | INTRAVENOUS | Status: DC
  Administered 2014-09-06: 600 mg via INTRAVENOUS
  Filled 2014-09-06: qty 60

## 2014-09-06 MED ORDER — METHYLPREDNISOLONE SODIUM SUCC 40 MG IJ SOLR
INTRAMUSCULAR | Status: AC
  Administered 2014-09-06: 40 mg
  Filled 2014-09-06: qty 1

## 2014-09-06 MED ORDER — METHYLPREDNISOLONE SODIUM SUCC 125 MG IJ SOLR
40.0000 mg | Freq: Once | INTRAMUSCULAR | Status: DC
Start: 2014-09-06 — End: 2014-09-07

## 2014-09-06 MED ORDER — ACETAMINOPHEN 325 MG PO TABS
ORAL_TABLET | ORAL | Status: AC
  Administered 2014-09-06: 650 mg via ORAL
  Filled 2014-09-06: qty 2

## 2014-09-07 ENCOUNTER — Encounter (HOSPITAL_COMMUNITY): Payer: BC Managed Care – PPO

## 2014-10-17 ENCOUNTER — Telehealth: Payer: Self-pay | Admitting: Cardiology

## 2014-10-17 DIAGNOSIS — E7800 Pure hypercholesterolemia, unspecified: Secondary | ICD-10-CM

## 2014-10-17 NOTE — Telephone Encounter (Signed)
Will ask Dr [name] what lab work he would like the patient to have.

## 2014-10-17 NOTE — Telephone Encounter (Signed)
Please check lipid panel.

## 2014-10-17 NOTE — Telephone Encounter (Signed)
New Prob    Pt is requesting labwork for yearly appt on 4/20. Orders needed in EPIC.

## 2014-10-18 NOTE — Telephone Encounter (Signed)
Spoke with pt how would like to come in for labs 4/6.  appt scheduled and orders placed.

## 2014-11-02 ENCOUNTER — Other Ambulatory Visit (HOSPITAL_COMMUNITY): Payer: Self-pay | Admitting: *Deleted

## 2014-11-03 ENCOUNTER — Ambulatory Visit (HOSPITAL_COMMUNITY)
Admission: RE | Admit: 2014-11-03 | Discharge: 2014-11-03 | Disposition: A | Discharge status: Home / Self Care | Payer: 59 | Source: Ambulatory Visit | Attending: Gastroenterology | Admitting: Gastroenterology

## 2014-11-03 DIAGNOSIS — K5090 Crohn's disease, unspecified, without complications: Secondary | ICD-10-CM | POA: Diagnosis not present

## 2014-11-03 MED ORDER — DIPHENHYDRAMINE HCL 50 MG/ML IJ SOLN
50.0000 mg | INTRAMUSCULAR | Status: DC
  Administered 2014-11-03: 50 mg via INTRAVENOUS

## 2014-11-03 MED ORDER — DIPHENHYDRAMINE HCL 50 MG/ML IJ SOLN
INTRAMUSCULAR | Status: AC
  Administered 2014-11-03: 50 mg via INTRAVENOUS
  Filled 2014-11-03: qty 1

## 2014-11-03 MED ORDER — METHYLPREDNISOLONE SODIUM SUCC 125 MG IJ SOLR: 40.0000 mg | INTRAMUSCULAR | Status: DC

## 2014-11-03 MED ORDER — ACETAMINOPHEN 325 MG PO TABS
650.0000 mg | ORAL_TABLET | ORAL | Status: DC
  Administered 2014-11-03: 650 mg via ORAL

## 2014-11-03 MED ORDER — ACETAMINOPHEN 325 MG PO TABS
ORAL_TABLET | ORAL | Status: AC
  Administered 2014-11-03: 650 mg via ORAL
  Filled 2014-11-03: qty 2

## 2014-11-03 MED ORDER — SODIUM CHLORIDE 0.9 % IV SOLN
INTRAVENOUS | Status: DC
  Administered 2014-11-03: 250 mL via INTRAVENOUS

## 2014-11-03 MED ORDER — SODIUM CHLORIDE 0.9 % IV SOLN
7.5000 mg/kg | INTRAVENOUS | Status: DC
  Administered 2014-11-03: 600 mg via INTRAVENOUS
  Filled 2014-11-03: qty 60

## 2014-11-03 MED ORDER — METHYLPREDNISOLONE SODIUM SUCC 40 MG IJ SOLR
INTRAMUSCULAR | Status: AC
  Administered 2014-11-03: 40 mg
  Filled 2014-11-03: qty 1

## 2014-11-08 ENCOUNTER — Other Ambulatory Visit: Payer: Self-pay

## 2014-11-08 MED ORDER — NIACIN ER (ANTIHYPERLIPIDEMIC) 1000 MG PO TBCR: 2000.0000 mg | EXTENDED_RELEASE_TABLET | Freq: Every day | ORAL | Status: DC

## 2014-11-08 MED ORDER — COLESEVELAM HCL 625 MG PO TABS: 1250.0000 mg | ORAL_TABLET | Freq: Two times a day (BID) | ORAL | Status: DC

## 2014-12-14 ENCOUNTER — Other Ambulatory Visit (HOSPITAL_COMMUNITY): Payer: Self-pay | Admitting: *Deleted

## 2014-12-15 ENCOUNTER — Encounter (HOSPITAL_COMMUNITY): Payer: PRIVATE HEALTH INSURANCE

## 2014-12-20 ENCOUNTER — Other Ambulatory Visit: Payer: Self-pay

## 2014-12-21 ENCOUNTER — Other Ambulatory Visit (INDEPENDENT_AMBULATORY_CARE_PROVIDER_SITE_OTHER): Payer: 59 | Admitting: *Deleted

## 2014-12-21 DIAGNOSIS — E7800 Pure hypercholesterolemia, unspecified: Secondary | ICD-10-CM

## 2014-12-21 LAB — LIPID PANEL
CHOLESTEROL: 179 mg/dL (ref 0–200)
HDL: 45.4 mg/dL (ref >39.00)
LDL CALC: 106 mg/dL — AB (ref 0–99)
NonHDL: 133.6
TRIGLYCERIDES: 139 mg/dL (ref 0.0–149.0)
Total CHOL/HDL Ratio: 4
VLDL: 27.8 mg/dL (ref 0.0–40.0)

## 2014-12-29 ENCOUNTER — Encounter (HOSPITAL_COMMUNITY)
Admission: RE | Admit: 2014-12-29 | Discharge: 2014-12-29 | Disposition: A | Discharge status: Home / Self Care | Payer: 59 | Source: Ambulatory Visit | Attending: Gastroenterology | Admitting: Gastroenterology

## 2014-12-29 DIAGNOSIS — K5090 Crohn's disease, unspecified, without complications: Secondary | ICD-10-CM | POA: Diagnosis not present

## 2014-12-29 DIAGNOSIS — Z79899 Other long term (current) drug therapy: Secondary | ICD-10-CM | POA: Insufficient documentation

## 2014-12-29 LAB — COMPREHENSIVE METABOLIC PANEL
ALK PHOS: 86 U/L (ref 39–117)
ALT: 27 U/L (ref 0–53)
ANION GAP: 8 (ref 5–15)
AST: 30 U/L (ref 0–37)
Albumin: 3.7 g/dL (ref 3.5–5.2)
BUN: 12 mg/dL (ref 6–23)
CALCIUM: 9.2 mg/dL (ref 8.4–10.5)
CHLORIDE: 107 mmol/L (ref 96–112)
CO2: 25 mmol/L (ref 19–32)
Creatinine, Ser: 1.07 mg/dL (ref 0.50–1.35)
GFR calc non Af Amer: 82 mL/min — ABNORMAL LOW (ref >90)
GLUCOSE: 101 mg/dL — AB (ref 70–99)
Potassium: 4.2 mmol/L (ref 3.5–5.1)
Sodium: 140 mmol/L (ref 135–145)
Total Bilirubin: 0.5 mg/dL (ref 0.3–1.2)
Total Protein: 6.7 g/dL (ref 6.0–8.3)

## 2014-12-29 LAB — DIFFERENTIAL
Basophils Absolute: 0 10*3/uL (ref 0.0–0.1)
Basophils Relative: 1 % (ref 0–1)
EOS ABS: 0.3 10*3/uL (ref 0.0–0.7)
Eosinophils Relative: 5 % (ref 0–5)
Lymphocytes Relative: 27 % (ref 12–46)
Lymphs Abs: 1.7 10*3/uL (ref 0.7–4.0)
MONOS PCT: 8 % (ref 3–12)
Monocytes Absolute: 0.5 10*3/uL (ref 0.1–1.0)
NEUTROS PCT: 59 % (ref 43–77)
Neutro Abs: 3.8 10*3/uL (ref 1.7–7.7)

## 2014-12-29 LAB — CBC
HCT: 44.1 % (ref 39.0–52.0)
Hemoglobin: 14.4 g/dL (ref 13.0–17.0)
MCH: 28.7 pg (ref 26.0–34.0)
MCHC: 32.7 g/dL (ref 30.0–36.0)
MCV: 88 fL (ref 78.0–100.0)
Platelets: 173 10*3/uL (ref 150–400)
RBC: 5.01 MIL/uL (ref 4.22–5.81)
RDW: 13.1 % (ref 11.5–15.5)
WBC: 6.3 10*3/uL (ref 4.0–10.5)

## 2014-12-29 MED ORDER — METHYLPREDNISOLONE SODIUM SUCC 40 MG IJ SOLR
INTRAMUSCULAR | Status: AC
  Filled 2014-12-29: qty 1

## 2014-12-29 MED ORDER — ACETAMINOPHEN 325 MG PO TABS
650.0000 mg | ORAL_TABLET | ORAL | Status: DC
  Administered 2014-12-29: 650 mg via ORAL

## 2014-12-29 MED ORDER — SODIUM CHLORIDE 0.9 % IV SOLN
7.5000 mg/kg | INTRAVENOUS | Status: DC
  Administered 2014-12-29: 600 mg via INTRAVENOUS
  Filled 2014-12-29: qty 60

## 2014-12-29 MED ORDER — ACETAMINOPHEN 325 MG PO TABS
ORAL_TABLET | ORAL | Status: AC
  Filled 2014-12-29: qty 2

## 2014-12-29 MED ORDER — SODIUM CHLORIDE 0.9 % IV SOLN
INTRAVENOUS | Status: DC
  Administered 2014-12-29: 09:00:00 via INTRAVENOUS

## 2014-12-29 MED ORDER — METHYLPREDNISOLONE SODIUM SUCC 125 MG IJ SOLR
40.0000 mg | INTRAMUSCULAR | Status: DC
  Administered 2014-12-29: 40 mg via INTRAVENOUS

## 2014-12-29 MED ORDER — DIPHENHYDRAMINE HCL 50 MG/ML IJ SOLN
INTRAMUSCULAR | Status: AC
  Filled 2014-12-29: qty 1

## 2014-12-29 MED ORDER — DIPHENHYDRAMINE HCL 50 MG/ML IJ SOLN
50.0000 mg | INTRAMUSCULAR | Status: DC
  Administered 2014-12-29: 50 mg via INTRAVENOUS

## 2015-01-03 ENCOUNTER — Ambulatory Visit: Payer: Self-pay | Admitting: Cardiology

## 2015-01-18 ENCOUNTER — Ambulatory Visit (INDEPENDENT_AMBULATORY_CARE_PROVIDER_SITE_OTHER): Payer: 59 | Admitting: Cardiology

## 2015-01-18 ENCOUNTER — Encounter: Payer: Self-pay | Admitting: Cardiology

## 2015-01-18 VITALS — BP 118/78 | HR 70 | Ht 68.0 in | Wt 194.0 lb

## 2015-01-18 DIAGNOSIS — Z8249 Family history of ischemic heart disease and other diseases of the circulatory system: Secondary | ICD-10-CM

## 2015-01-18 DIAGNOSIS — R0600 Dyspnea, unspecified: Secondary | ICD-10-CM | POA: Diagnosis not present

## 2015-01-18 DIAGNOSIS — K5090 Crohn's disease, unspecified, without complications: Secondary | ICD-10-CM

## 2015-01-18 DIAGNOSIS — Z720 Tobacco use: Secondary | ICD-10-CM | POA: Insufficient documentation

## 2015-01-18 NOTE — Patient Instructions (Signed)
Medication Instructions:  Your physician recommends that you continue on your current medications as directed. Please refer to the Current Medication list given to you today.  Labwork: None  Testing/Procedures: Your physician has requested that you have a myoview. For further information please visit [email or web address]. Please follow instruction sheet, as given.  Follow-Up: Follow up in 1 year with [name].  You will receive a letter in the mail 2 months before you are due.  Please call us when you receive this letter to schedule your follow up appointment.  Thank you for choosing [facility]!!

## 2015-01-18 NOTE — Progress Notes (Signed)
[facility]. [address], [address], [place]  [address] Phone: [phone] Fax:  [phone]  Date:  01/18/2015   ID:  Jose [name], DOB September 06, 1971, MRN [id number]  PCP:  No PCP Per Patient   History of Present Illness: Jose [name] is a 44 y.o. male with Crohn's disease currently in remission with hyperlipidemia intolerant to statin use here for followup.   Nuclear stress 03/26/09-low risk, no ischemia, 10 minutes 45 seconds.  No chest pain, no syncope, no bleeding.  Brother had bypass surgery in 2015.  He is very interested in quitting smoking. He has taken Chantix in the past. He asked me to prescribe. I explained to him the slight increase in cardiovascular risk with this medication. He also had be had dreams with the medication. He is willing to take it again. He's not having any chest pain but has been feeling shortness of breath with activity. His notice that this is a change for him. Concerned about possibility of underlying coronary artery disease.    Wt Readings from Last 3 Encounters:  01/18/15 194 lb (87.998 kg)  12/29/14 187 lb (84.823 kg)  11/03/14 190 lb (86.183 kg)     Past Medical History  Diagnosis Date  . Crohn's disease   . Hyperlipidemia     No past surgical history on file.  Current Outpatient Prescriptions  Medication Sig Dispense Refill  . aspirin 81 MG tablet Take 162 mg by mouth daily.      . colesevelam (WELCHOL) 625 MG tablet Take 2 tablets (1,250 mg total) by mouth 2 (two) times daily with a meal. 360 tablet 3  . InFLIXimab (REMICADE IV) Inject into the vein every 8 (eight) weeks.      . niacin (NIASPAN) 1000 MG CR tablet Take 2 tablets (2,000 mg total) by mouth at bedtime. 180 tablet 3   No current facility-administered medications for this visit.    Allergies:    Allergies  Allergen Reactions  . Statins Other (See Comments)    Chest pain    Social History:  The patient  reports that he has been smoking Cigarettes.   He has been smoking about 0.50 packs per day. He does not have any smokeless tobacco history on file. He reports that he does not drink alcohol or use illicit drugs.   ROS:  Please see the history of present illness.   Denies any fevers, chills, orthopnea, PND   PHYSICAL EXAM: VS:  BP 118/78 mmHg  Pulse 70  Ht 5' 8"  (1.727 m)  Wt 194 lb (87.998 kg)  BMI 29.50 kg/m2 Well nourished, well developed, in no acute distress HEENT: normal Neck: no JVD Cardiac:  normal S1, S2; RRR; no murmur Lungs:  clear to auscultation bilaterally, no wheezing, rhonchi or rales Abd: soft, nontender, no hepatomegaly Ext: no edema Skin: warm and dry Neuro: no focal abnormalities noted  EKG: 12/28/13-sinus rhythm rate 68 with no other specific abnormalities, Labs: 09/16/13-creatinine 0.92, glucose 130, hemoglobin 14.6, LDL 106, small LDL particle size 520 within normal range.  ASSESSMENT AND PLAN:  1. Dyspnea on exertion-with a strong family history, smoking history, I will proceed with nuclear stress test, treadmill to exclude ischemia as possible cause. 2. Hyperlipidemia-medications reviewed. He has had statin intolerance. Currently on Niaspan, aspirin) flushing. Continue with exercise.  lipid panel unchanged. LDL small particle size excellent previously. 3. Family history of CAD-brother in 2015 had bypass surgery. He was a former smoker. Continue to modify  risk factors. 4. Tobacco use-tobacco cessation discussed at length. This is the single most modifiable risk factor that is highly risky with regards to coronary vascular disease. He is trying E. cigarettes. He may try patch. He does not want to set himself up to fail. I have referred him to [facility] website. We have given him information. 5. One year followup. Will follow up with NUC stress.  6. Crohn's disease is stable on Q8 week Remicade.  Signed, [name], MD [facility]  01/18/2015 9:02 AM

## 2015-02-22 ENCOUNTER — Other Ambulatory Visit (HOSPITAL_COMMUNITY): Payer: Self-pay | Admitting: *Deleted

## 2015-02-23 ENCOUNTER — Encounter (HOSPITAL_COMMUNITY)
Admission: RE | Admit: 2015-02-23 | Discharge: 2015-02-23 | Disposition: A | Discharge status: Home / Self Care | Payer: 59 | Source: Ambulatory Visit | Attending: Gastroenterology | Admitting: Gastroenterology

## 2015-02-23 DIAGNOSIS — K5010 Crohn's disease of large intestine without complications: Secondary | ICD-10-CM | POA: Insufficient documentation

## 2015-02-23 MED ORDER — ACETAMINOPHEN 325 MG PO TABS
ORAL_TABLET | ORAL | Status: AC
  Filled 2015-02-23: qty 2

## 2015-02-23 MED ORDER — SODIUM CHLORIDE 0.9 % IV SOLN
7.5000 mg/kg | INTRAVENOUS | Status: DC
Start: 2015-02-23 — End: 2015-02-24
  Administered 2015-02-23: 600 mg via INTRAVENOUS
  Filled 2015-02-23: qty 60

## 2015-02-23 MED ORDER — METHYLPREDNISOLONE SODIUM SUCC 40 MG IJ SOLR
INTRAMUSCULAR | Status: AC
  Administered 2015-02-23: 40 mg
  Filled 2015-02-23: qty 1

## 2015-02-23 MED ORDER — ACETAMINOPHEN 325 MG PO TABS
650.0000 mg | ORAL_TABLET | ORAL | Status: DC
  Administered 2015-02-23: 650 mg via ORAL

## 2015-02-23 MED ORDER — DIPHENHYDRAMINE HCL 50 MG/ML IJ SOLN
50.0000 mg | INTRAMUSCULAR | Status: DC
  Administered 2015-02-23: 50 mg via INTRAVENOUS

## 2015-02-23 MED ORDER — DIPHENHYDRAMINE HCL 50 MG/ML IJ SOLN
INTRAMUSCULAR | Status: AC
  Filled 2015-02-23: qty 1

## 2015-02-23 MED ORDER — METHYLPREDNISOLONE SODIUM SUCC 125 MG IJ SOLR: 40.0000 mg | INTRAMUSCULAR | Status: DC

## 2015-02-23 MED ORDER — SODIUM CHLORIDE 0.9 % IV SOLN
INTRAVENOUS | Status: DC
  Administered 2015-02-23: 09:00:00 via INTRAVENOUS

## 2015-03-28 ENCOUNTER — Telehealth (HOSPITAL_COMMUNITY): Payer: Self-pay | Admitting: *Deleted

## 2015-03-28 NOTE — Telephone Encounter (Signed)
Left message on voicemail in reference to upcoming appointment scheduled for 03/30/15. Phone number given for a call back so details instructions can be given. Bently Morath J Miaisabella Bacorn, RN 

## 2015-03-29 ENCOUNTER — Telehealth (HOSPITAL_COMMUNITY): Payer: Self-pay | Admitting: *Deleted

## 2015-03-29 NOTE — Telephone Encounter (Signed)
Left message on voicemail in reference to upcoming appointment scheduled for 03/30/15. Phone number given for a call back so details instructions can be given. Dorathy Stallone J Kollyns Mickelson, RN 

## 2015-03-30 ENCOUNTER — Ambulatory Visit (HOSPITAL_COMMUNITY): Payer: 59 | Attending: Cardiology

## 2015-03-30 DIAGNOSIS — R0600 Dyspnea, unspecified: Secondary | ICD-10-CM

## 2015-03-30 LAB — MYOCARDIAL PERFUSION IMAGING
CHL CUP NUCLEAR SDS: 1
CHL CUP NUCLEAR SRS: 3
CSEPEDS: 1 s
Estimated workload: 13.4 METS
Exercise duration (min): 12 min
LV sys vol: 43 mL
LVDIAVOL: 95 mL
MPHR: 175 {beats}/min
NUC STRESS TID: 0.76
Peak HR: 171 {beats}/min
Percent HR: 97 %
RATE: 0.32
Rest HR: 62 {beats}/min
SSS: 4

## 2015-03-30 MED ORDER — TECHNETIUM TC 99M SESTAMIBI GENERIC - CARDIOLITE
32.4000 | Freq: Once | INTRAVENOUS | Status: AC | PRN
  Administered 2015-03-30: 32 via INTRAVENOUS

## 2015-03-30 MED ORDER — TECHNETIUM TC 99M SESTAMIBI GENERIC - CARDIOLITE
9.7000 | Freq: Once | INTRAVENOUS | Status: AC | PRN
  Administered 2015-03-30: 10 via INTRAVENOUS

## 2015-04-20 ENCOUNTER — Encounter (HOSPITAL_COMMUNITY): Payer: 59

## 2015-05-04 ENCOUNTER — Encounter (HOSPITAL_COMMUNITY): Payer: 59

## 2015-05-10 ENCOUNTER — Other Ambulatory Visit (HOSPITAL_COMMUNITY): Payer: Self-pay | Admitting: *Deleted

## 2015-05-11 ENCOUNTER — Encounter (HOSPITAL_COMMUNITY)
Admission: RE | Admit: 2015-05-11 | Discharge: 2015-05-11 | Disposition: A | Discharge status: Home / Self Care | Payer: 59 | Source: Ambulatory Visit | Attending: Gastroenterology | Admitting: Gastroenterology

## 2015-05-11 DIAGNOSIS — K5010 Crohn's disease of large intestine without complications: Secondary | ICD-10-CM | POA: Insufficient documentation

## 2015-05-11 MED ORDER — ACETAMINOPHEN 325 MG PO TABS
ORAL_TABLET | ORAL | Status: AC
  Filled 2015-05-11: qty 2

## 2015-05-11 MED ORDER — SODIUM CHLORIDE 0.9 % IV SOLN
7.5000 mg/kg | INTRAVENOUS | Status: DC
  Administered 2015-05-11: 700 mg via INTRAVENOUS
  Filled 2015-05-11: qty 70

## 2015-05-11 MED ORDER — METHYLPREDNISOLONE SODIUM SUCC 125 MG IJ SOLR: 40.0000 mg | INTRAMUSCULAR | Status: DC

## 2015-05-11 MED ORDER — DIPHENHYDRAMINE HCL 50 MG/ML IJ SOLN
50.0000 mg | INTRAMUSCULAR | Status: DC
  Administered 2015-05-11: 50 mg via INTRAVENOUS

## 2015-05-11 MED ORDER — ACETAMINOPHEN 325 MG PO TABS
650.0000 mg | ORAL_TABLET | ORAL | Status: DC
  Administered 2015-05-11: 650 mg via ORAL

## 2015-05-11 MED ORDER — DIPHENHYDRAMINE HCL 50 MG/ML IJ SOLN
INTRAMUSCULAR | Status: AC
  Filled 2015-05-11: qty 1

## 2015-05-11 MED ORDER — SODIUM CHLORIDE 0.9 % IV SOLN
INTRAVENOUS | Status: DC
  Administered 2015-05-11: 09:00:00 via INTRAVENOUS

## 2015-05-11 MED ORDER — METHYLPREDNISOLONE SODIUM SUCC 40 MG IJ SOLR
INTRAMUSCULAR | Status: AC
  Administered 2015-05-11: 40 mg
  Filled 2015-05-11: qty 1

## 2015-06-29 ENCOUNTER — Encounter (HOSPITAL_COMMUNITY)
Admission: RE | Admit: 2015-06-29 | Discharge: 2015-06-29 | Disposition: A | Discharge status: Home / Self Care | Payer: 59 | Source: Ambulatory Visit | Attending: Gastroenterology | Admitting: Gastroenterology

## 2015-06-29 DIAGNOSIS — K5010 Crohn's disease of large intestine without complications: Secondary | ICD-10-CM | POA: Diagnosis present

## 2015-06-29 LAB — DIFFERENTIAL
BASOS PCT: 0 %
Basophils Absolute: 0 10*3/uL (ref 0.0–0.1)
Eosinophils Absolute: 0.2 10*3/uL (ref 0.0–0.7)
Eosinophils Relative: 3 %
LYMPHS PCT: 26 %
Lymphs Abs: 1.9 10*3/uL (ref 0.7–4.0)
Monocytes Absolute: 0.6 10*3/uL (ref 0.1–1.0)
Monocytes Relative: 9 %
NEUTROS PCT: 62 %
Neutro Abs: 4.3 10*3/uL (ref 1.7–7.7)

## 2015-06-29 LAB — COMPREHENSIVE METABOLIC PANEL
ALBUMIN: 4.1 g/dL (ref 3.5–5.0)
ALT: 24 U/L (ref 17–63)
ANION GAP: 6 (ref 5–15)
AST: 31 U/L (ref 15–41)
Alkaline Phosphatase: 73 U/L (ref 38–126)
BUN: 16 mg/dL (ref 6–20)
CHLORIDE: 108 mmol/L (ref 101–111)
CO2: 26 mmol/L (ref 22–32)
Calcium: 9.5 mg/dL (ref 8.9–10.3)
Creatinine, Ser: 1.16 mg/dL (ref 0.61–1.24)
GFR calc Af Amer: >60 mL/min (ref >60)
GFR calc non Af Amer: >60 mL/min (ref >60)
GLUCOSE: 100 mg/dL — AB (ref 65–99)
POTASSIUM: 4.1 mmol/L (ref 3.5–5.1)
Sodium: 140 mmol/L (ref 135–145)
Total Bilirubin: 0.6 mg/dL (ref 0.3–1.2)
Total Protein: 6.9 g/dL (ref 6.5–8.1)

## 2015-06-29 LAB — CBC
HCT: 43.9 % (ref 39.0–52.0)
Hemoglobin: 14.7 g/dL (ref 13.0–17.0)
MCH: 29.3 pg (ref 26.0–34.0)
MCHC: 33.5 g/dL (ref 30.0–36.0)
MCV: 87.5 fL (ref 78.0–100.0)
PLATELETS: 188 10*3/uL (ref 150–400)
RBC: 5.02 MIL/uL (ref 4.22–5.81)
RDW: 13.4 % (ref 11.5–15.5)
WBC: 7.1 10*3/uL (ref 4.0–10.5)

## 2015-06-29 MED ORDER — METHYLPREDNISOLONE SODIUM SUCC 125 MG IJ SOLR: 40.0000 mg | INTRAMUSCULAR | Status: DC

## 2015-06-29 MED ORDER — SODIUM CHLORIDE 0.9 % IV SOLN
INTRAVENOUS | Status: DC
  Administered 2015-06-29: 250 mL via INTRAVENOUS

## 2015-06-29 MED ORDER — DIPHENHYDRAMINE HCL 50 MG/ML IJ SOLN
INTRAMUSCULAR | Status: AC
  Administered 2015-06-29: 50 mg via INTRAVENOUS
  Filled 2015-06-29: qty 1

## 2015-06-29 MED ORDER — METHYLPREDNISOLONE SODIUM SUCC 40 MG IJ SOLR
INTRAMUSCULAR | Status: AC
  Administered 2015-06-29: 40 mg
  Filled 2015-06-29: qty 1

## 2015-06-29 MED ORDER — ACETAMINOPHEN 325 MG PO TABS
650.0000 mg | ORAL_TABLET | ORAL | Status: DC
  Administered 2015-06-29: 650 mg via ORAL

## 2015-06-29 MED ORDER — SODIUM CHLORIDE 0.9 % IV SOLN
7.5000 mg/kg | INTRAVENOUS | Status: AC
  Administered 2015-06-29: 700 mg via INTRAVENOUS
  Filled 2015-06-29: qty 70

## 2015-06-29 MED ORDER — ACETAMINOPHEN 325 MG PO TABS
ORAL_TABLET | ORAL | Status: AC
  Administered 2015-06-29: 650 mg via ORAL
  Filled 2015-06-29: qty 2

## 2015-06-29 MED ORDER — DIPHENHYDRAMINE HCL 50 MG/ML IJ SOLN
50.0000 mg | INTRAMUSCULAR | Status: DC
  Administered 2015-06-29: 50 mg via INTRAVENOUS

## 2015-07-06 ENCOUNTER — Encounter (HOSPITAL_COMMUNITY): Payer: 59

## 2015-08-23 ENCOUNTER — Other Ambulatory Visit (HOSPITAL_COMMUNITY): Payer: Self-pay | Admitting: *Deleted

## 2015-08-24 ENCOUNTER — Encounter (HOSPITAL_COMMUNITY): Payer: 59

## 2015-08-31 ENCOUNTER — Encounter (HOSPITAL_COMMUNITY)
Admission: RE | Admit: 2015-08-31 | Discharge: 2015-08-31 | Disposition: A | Discharge status: Home / Self Care | Payer: 59 | Source: Ambulatory Visit | Attending: Gastroenterology | Admitting: Gastroenterology

## 2015-08-31 DIAGNOSIS — K5010 Crohn's disease of large intestine without complications: Secondary | ICD-10-CM | POA: Diagnosis not present

## 2015-08-31 MED ORDER — ACETAMINOPHEN 325 MG PO TABS
650.0000 mg | ORAL_TABLET | ORAL | Status: DC
  Administered 2015-08-31: 650 mg via ORAL

## 2015-08-31 MED ORDER — DIPHENHYDRAMINE HCL 50 MG/ML IJ SOLN
INTRAMUSCULAR | Status: AC
  Filled 2015-08-31: qty 1

## 2015-08-31 MED ORDER — DIPHENHYDRAMINE HCL 50 MG/ML IJ SOLN
50.0000 mg | INTRAMUSCULAR | Status: DC
  Administered 2015-08-31: 50 mg via INTRAVENOUS

## 2015-08-31 MED ORDER — ACETAMINOPHEN 325 MG PO TABS
ORAL_TABLET | ORAL | Status: AC
Start: 2015-08-31 — End: 2015-08-31
  Administered 2015-08-31: 650 mg via ORAL
  Filled 2015-08-31: qty 2

## 2015-08-31 MED ORDER — SODIUM CHLORIDE 0.9 % IV SOLN
INTRAVENOUS | Status: DC
  Administered 2015-08-31: 250 mL via INTRAVENOUS

## 2015-08-31 MED ORDER — SODIUM CHLORIDE 0.9 % IV SOLN
7.5000 mg/kg | INTRAVENOUS | Status: DC
  Administered 2015-08-31: 600 mg via INTRAVENOUS
  Filled 2015-08-31: qty 60

## 2015-08-31 MED ORDER — DIPHENHYDRAMINE HCL 25 MG PO CAPS
ORAL_CAPSULE | ORAL | Status: AC
  Filled 2015-08-31: qty 2

## 2015-08-31 MED ORDER — METHYLPREDNISOLONE SODIUM SUCC 40 MG IJ SOLR
INTRAMUSCULAR | Status: AC
Start: 2015-08-31 — End: 2015-08-31
  Administered 2015-08-31: 40 mg
  Filled 2015-08-31: qty 1

## 2015-08-31 MED ORDER — METHYLPREDNISOLONE SODIUM SUCC 125 MG IJ SOLR: 40.0000 mg | INTRAMUSCULAR | Status: DC

## 2015-10-25 ENCOUNTER — Other Ambulatory Visit (HOSPITAL_COMMUNITY): Payer: Self-pay | Admitting: *Deleted

## 2015-10-26 ENCOUNTER — Ambulatory Visit (HOSPITAL_COMMUNITY)
Admission: RE | Admit: 2015-10-26 | Discharge: 2015-10-26 | Disposition: A | Discharge status: Home / Self Care | Payer: 59 | Source: Ambulatory Visit | Attending: Gastroenterology | Admitting: Gastroenterology

## 2015-10-26 DIAGNOSIS — K5010 Crohn's disease of large intestine without complications: Secondary | ICD-10-CM | POA: Insufficient documentation

## 2015-10-26 LAB — COMPREHENSIVE METABOLIC PANEL
ALT: 32 U/L (ref 17–63)
ANION GAP: 11 (ref 5–15)
AST: 31 U/L (ref 15–41)
Albumin: 3.6 g/dL (ref 3.5–5.0)
Alkaline Phosphatase: 63 U/L (ref 38–126)
BUN: 14 mg/dL (ref 6–20)
CALCIUM: 9.5 mg/dL (ref 8.9–10.3)
CO2: 24 mmol/L (ref 22–32)
Chloride: 106 mmol/L (ref 101–111)
Creatinine, Ser: 1.17 mg/dL (ref 0.61–1.24)
GFR calc Af Amer: >60 mL/min (ref >60)
GLUCOSE: 95 mg/dL (ref 65–99)
POTASSIUM: 4.1 mmol/L (ref 3.5–5.1)
SODIUM: 141 mmol/L (ref 135–145)
TOTAL PROTEIN: 6.7 g/dL (ref 6.5–8.1)
Total Bilirubin: 0.4 mg/dL (ref 0.3–1.2)

## 2015-10-26 LAB — CBC WITH DIFFERENTIAL/PLATELET
Basophils Absolute: 0 10*3/uL (ref 0.0–0.1)
Basophils Relative: 1 %
EOS ABS: 0.2 10*3/uL (ref 0.0–0.7)
EOS PCT: 4 %
HCT: 42.6 % (ref 39.0–52.0)
Hemoglobin: 14.4 g/dL (ref 13.0–17.0)
LYMPHS ABS: 1.7 10*3/uL (ref 0.7–4.0)
LYMPHS PCT: 28 %
MCH: 29.3 pg (ref 26.0–34.0)
MCHC: 33.8 g/dL (ref 30.0–36.0)
MCV: 86.8 fL (ref 78.0–100.0)
MONO ABS: 0.6 10*3/uL (ref 0.1–1.0)
Monocytes Relative: 10 %
Neutro Abs: 3.6 10*3/uL (ref 1.7–7.7)
Neutrophils Relative %: 57 %
PLATELETS: 173 10*3/uL (ref 150–400)
RBC: 4.91 MIL/uL (ref 4.22–5.81)
RDW: 13.3 % (ref 11.5–15.5)
WBC: 6.2 10*3/uL (ref 4.0–10.5)

## 2015-10-26 LAB — C-REACTIVE PROTEIN

## 2015-10-26 MED ORDER — METHYLPREDNISOLONE SODIUM SUCC 40 MG IJ SOLR: 40.0000 mg | INTRAMUSCULAR | Status: DC

## 2015-10-26 MED ORDER — DIPHENHYDRAMINE HCL 50 MG/ML IJ SOLN
INTRAMUSCULAR | Status: AC
  Administered 2015-10-26: 50 mg
  Filled 2015-10-26: qty 1

## 2015-10-26 MED ORDER — SODIUM CHLORIDE 0.9 % IV SOLN
7.5000 mg/kg | INTRAVENOUS | Status: DC
  Administered 2015-10-26: 600 mg via INTRAVENOUS
  Filled 2015-10-26: qty 60

## 2015-10-26 MED ORDER — ACETAMINOPHEN 325 MG PO TABS
ORAL_TABLET | ORAL | Status: AC
  Administered 2015-10-26: 650 mg
  Filled 2015-10-26: qty 2

## 2015-10-26 MED ORDER — DIPHENHYDRAMINE HCL 50 MG/ML IJ SOLN: 50.0000 mg | INTRAMUSCULAR | Status: DC

## 2015-10-26 MED ORDER — SODIUM CHLORIDE 0.9 % IV SOLN
INTRAVENOUS | Status: DC
  Administered 2015-10-26: 09:00:00 via INTRAVENOUS

## 2015-10-26 MED ORDER — METHYLPREDNISOLONE SODIUM SUCC 40 MG IJ SOLR
INTRAMUSCULAR | Status: AC
  Administered 2015-10-26: 40 mg
  Filled 2015-10-26: qty 1

## 2015-10-26 MED ORDER — ACETAMINOPHEN 325 MG PO TABS: 650.0000 mg | ORAL_TABLET | ORAL | Status: DC

## 2015-11-06 ENCOUNTER — Other Ambulatory Visit: Payer: Self-pay | Admitting: Cardiology

## 2015-11-06 NOTE — Telephone Encounter (Signed)
REFILL 

## 2015-12-20 ENCOUNTER — Other Ambulatory Visit (HOSPITAL_COMMUNITY): Payer: Self-pay | Admitting: *Deleted

## 2015-12-21 ENCOUNTER — Ambulatory Visit (HOSPITAL_COMMUNITY)
Admission: RE | Admit: 2015-12-21 | Discharge: 2015-12-21 | Disposition: A | Discharge status: Home / Self Care | Payer: 59 | Source: Ambulatory Visit | Attending: Gastroenterology | Admitting: Gastroenterology

## 2015-12-21 DIAGNOSIS — K5010 Crohn's disease of large intestine without complications: Secondary | ICD-10-CM | POA: Diagnosis not present

## 2015-12-21 LAB — CBC WITH DIFFERENTIAL/PLATELET
BASOS ABS: 0 10*3/uL (ref 0.0–0.1)
BASOS PCT: 0 %
Eosinophils Absolute: 0.2 10*3/uL (ref 0.0–0.7)
Eosinophils Relative: 3 %
HEMATOCRIT: 40.5 % (ref 39.0–52.0)
HEMOGLOBIN: 13.6 g/dL (ref 13.0–17.0)
LYMPHS PCT: 23 %
Lymphs Abs: 1.6 10*3/uL (ref 0.7–4.0)
MCH: 28.6 pg (ref 26.0–34.0)
MCHC: 33.6 g/dL (ref 30.0–36.0)
MCV: 85.1 fL (ref 78.0–100.0)
Monocytes Absolute: 0.7 10*3/uL (ref 0.1–1.0)
Monocytes Relative: 10 %
NEUTROS ABS: 4.5 10*3/uL (ref 1.7–7.7)
NEUTROS PCT: 64 %
Platelets: 170 10*3/uL (ref 150–400)
RBC: 4.76 MIL/uL (ref 4.22–5.81)
RDW: 13 % (ref 11.5–15.5)
WBC: 6.9 10*3/uL (ref 4.0–10.5)

## 2015-12-21 LAB — COMPREHENSIVE METABOLIC PANEL
ALBUMIN: 3.6 g/dL (ref 3.5–5.0)
ALT: 35 U/L (ref 17–63)
AST: 38 U/L (ref 15–41)
Alkaline Phosphatase: 67 U/L (ref 38–126)
Anion gap: 11 (ref 5–15)
BILIRUBIN TOTAL: 0.5 mg/dL (ref 0.3–1.2)
BUN: 15 mg/dL (ref 6–20)
CO2: 22 mmol/L (ref 22–32)
CREATININE: 1.08 mg/dL (ref 0.61–1.24)
Calcium: 9.2 mg/dL (ref 8.9–10.3)
Chloride: 105 mmol/L (ref 101–111)
GFR calc Af Amer: >60 mL/min (ref >60)
GLUCOSE: 86 mg/dL (ref 65–99)
POTASSIUM: 4.2 mmol/L (ref 3.5–5.1)
Sodium: 138 mmol/L (ref 135–145)
TOTAL PROTEIN: 6.8 g/dL (ref 6.5–8.1)

## 2015-12-21 LAB — C-REACTIVE PROTEIN

## 2015-12-21 MED ORDER — ACETAMINOPHEN 325 MG PO TABS
650.0000 mg | ORAL_TABLET | ORAL | Status: DC
  Administered 2015-12-21: 650 mg via ORAL

## 2015-12-21 MED ORDER — ACETAMINOPHEN 325 MG PO TABS
ORAL_TABLET | ORAL | Status: AC
  Filled 2015-12-21: qty 2

## 2015-12-21 MED ORDER — METHYLPREDNISOLONE SODIUM SUCC 40 MG IJ SOLR
40.0000 mg | INTRAMUSCULAR | Status: AC
  Administered 2015-12-21: 40 mg via INTRAVENOUS

## 2015-12-21 MED ORDER — SODIUM CHLORIDE 0.9 % IV SOLN
INTRAVENOUS | Status: DC
  Administered 2015-12-21: 09:00:00 via INTRAVENOUS

## 2015-12-21 MED ORDER — DIPHENHYDRAMINE HCL 50 MG/ML IJ SOLN
50.0000 mg | INTRAMUSCULAR | Status: AC
  Administered 2015-12-21: 50 mg via INTRAVENOUS

## 2015-12-21 MED ORDER — METHYLPREDNISOLONE SODIUM SUCC 40 MG IJ SOLR
INTRAMUSCULAR | Status: AC
  Filled 2015-12-21: qty 1

## 2015-12-21 MED ORDER — SODIUM CHLORIDE 0.9 % IV SOLN
7.5000 mg/kg | INTRAVENOUS | Status: AC
  Administered 2015-12-21: 700 mg via INTRAVENOUS
  Filled 2015-12-21: qty 70

## 2015-12-21 MED ORDER — DIPHENHYDRAMINE HCL 50 MG/ML IJ SOLN
INTRAMUSCULAR | Status: AC
  Filled 2015-12-21: qty 1

## 2016-02-15 ENCOUNTER — Ambulatory Visit (HOSPITAL_COMMUNITY)
Admission: RE | Admit: 2016-02-15 | Discharge: 2016-02-15 | Disposition: A | Discharge status: Home / Self Care | Payer: 59 | Source: Ambulatory Visit | Attending: Gastroenterology | Admitting: Gastroenterology

## 2016-02-15 DIAGNOSIS — K5010 Crohn's disease of large intestine without complications: Secondary | ICD-10-CM | POA: Diagnosis not present

## 2016-02-15 MED ORDER — ACETAMINOPHEN 325 MG PO TABS: 650.0000 mg | ORAL_TABLET | ORAL | Status: DC

## 2016-02-15 MED ORDER — ACETAMINOPHEN 325 MG PO TABS
ORAL_TABLET | ORAL | Status: AC
  Administered 2016-02-15: 650 mg
  Filled 2016-02-15: qty 2

## 2016-02-15 MED ORDER — METHYLPREDNISOLONE SODIUM SUCC 40 MG IJ SOLR
INTRAMUSCULAR | Status: AC
  Administered 2016-02-15: 40 mg
  Filled 2016-02-15: qty 1

## 2016-02-15 MED ORDER — SODIUM CHLORIDE 0.9 % IV SOLN
INTRAVENOUS | Status: DC
  Administered 2016-02-15: 09:00:00 via INTRAVENOUS

## 2016-02-15 MED ORDER — DIPHENHYDRAMINE HCL 50 MG/ML IJ SOLN: 50.0000 mg | INTRAMUSCULAR | Status: DC

## 2016-02-15 MED ORDER — METHYLPREDNISOLONE SODIUM SUCC 40 MG IJ SOLR: 40.0000 mg | INTRAMUSCULAR | Status: DC

## 2016-02-15 MED ORDER — DIPHENHYDRAMINE HCL 50 MG/ML IJ SOLN
INTRAMUSCULAR | Status: AC
  Administered 2016-02-15: 50 mg
  Filled 2016-02-15: qty 1

## 2016-02-15 MED ORDER — SODIUM CHLORIDE 0.9 % IV SOLN
7.5000 mg/kg | INTRAVENOUS | Status: AC
  Administered 2016-02-15: 700 mg via INTRAVENOUS
  Filled 2016-02-15: qty 70

## 2016-03-04 ENCOUNTER — Encounter: Payer: Self-pay | Admitting: Cardiology

## 2016-03-14 ENCOUNTER — Encounter: Payer: Self-pay | Admitting: Cardiology

## 2016-03-14 ENCOUNTER — Ambulatory Visit (INDEPENDENT_AMBULATORY_CARE_PROVIDER_SITE_OTHER): Payer: 59 | Admitting: Cardiology

## 2016-03-14 ENCOUNTER — Telehealth: Payer: Self-pay | Admitting: Oncology

## 2016-03-14 ENCOUNTER — Encounter: Payer: Self-pay | Admitting: Oncology

## 2016-03-14 VITALS — BP 124/72 | HR 72 | Ht 68.0 in | Wt 199.8 lb

## 2016-03-14 DIAGNOSIS — E785 Hyperlipidemia, unspecified: Secondary | ICD-10-CM

## 2016-03-14 DIAGNOSIS — Z8249 Family history of ischemic heart disease and other diseases of the circulatory system: Secondary | ICD-10-CM

## 2016-03-14 DIAGNOSIS — I809 Phlebitis and thrombophlebitis of unspecified site: Secondary | ICD-10-CM

## 2016-03-14 LAB — LIPID PANEL
CHOL/HDL RATIO: 2.6 ratio (ref <=5.0)
Cholesterol: 166 mg/dL (ref 125–200)
HDL: 63 mg/dL (ref >=40)
LDL Cholesterol: 83 mg/dL (ref <130)
Triglycerides: 99 mg/dL (ref <150)
VLDL: 20 mg/dL (ref <30)

## 2016-03-14 NOTE — Patient Instructions (Signed)
Medication Instructions:  The current medical regimen is effective;  continue present plan and medications.  Labwork: Please have blood work today (Lipid)  You are being referred to hematology.  Follow-Up: Follow up in 1 year with [name].  You will receive a letter in the mail 2 months before you are due.  Please call us when you receive this letter to schedule your follow up appointment.  If you need a refill on your cardiac medications before your next appointment, please call your pharmacy.  Thank you for choosing [facility]!!

## 2016-03-14 NOTE — Telephone Encounter (Signed)
Referring office called to obtains a hem appt in Aug for pt. Office completed intake and will contact pt with appt. appt letter was mailed

## 2016-03-14 NOTE — Progress Notes (Signed)
[name]. [address], [address] [place], [place]  [address] Phone: [phone] Fax:  [phone]  Date:  03/14/2016   ID:  Jose [name], DOB 1971/08/19, MRN [id number]  PCP:  Velna Ochs, MD   History of Present Illness: Jose [name] is a 45 y.o. male with Crohn's disease currently in remission with hyperlipidemia intolerant to statin use here for followup.   Nuclear stress 03/30/15-low risk, no ischemia, 10 minutes 45 seconds.  No chest pain, no syncope, no bleeding.  Brother had bypass surgery in 2015.  Quit smoking. 09/2015  Had superficial thrombopheltisits after travel.No DVT. Bactrim. Has also had some thrombus at the infusion site of Remicade.  Overall he is very proud  out of his smoking cessation. He is not having any chest pain, sicca B, bleeding, orthopnea, PND.  Wt Readings from Last 3 Encounters:  03/14/16 199 lb 12.8 oz (90.629 kg)  02/15/16 195 lb (88.451 kg)  12/21/15 193 lb (87.544 kg)     Past Medical History  Diagnosis Date  . Crohn's disease ([place])   . Hyperlipidemia     No past surgical history on file.  Current Outpatient Prescriptions  Medication Sig Dispense Refill  . aspirin 81 MG tablet Take 162 mg by mouth daily.      . colesevelam (WELCHOL) 625 MG tablet Take 2 tablets (1,250 mg total) by mouth 2 (two) times daily with a meal. 360 tablet 3  . InFLIXimab (REMICADE IV) Inject into the vein every 8 (eight) weeks.      . niacin (NIASPAN) 1000 MG CR tablet Take 2 tablets by mouth at  bedtime 180 tablet 1   No current facility-administered medications for this visit.    Allergies:    Allergies  Allergen Reactions  . Statins Other (See Comments)    Chest pain    Social History:  The patient  reports that he has been smoking Cigarettes.  He has been smoking about 0.50 packs per day. He does not have any smokeless tobacco history on file. He reports that he does not drink alcohol or use illicit drugs.   ROS:  Please see the  history of present illness.   Denies any fevers, chills, orthopnea, PND   PHYSICAL EXAM: VS:  BP 124/72 mmHg  Pulse 72  Ht 5' 8"  (1.727 m)  Wt 199 lb 12.8 oz (90.629 kg)  BMI 30.39 kg/m2 Well nourished, well developed, in no acute distress HEENT: normal Neck: no JVD Cardiac:  normal S1, S2; RRR; no murmur Lungs:  clear to auscultation bilaterally, no wheezing, rhonchi or rales Abd: soft, nontender, no hepatomegaly Ext: no edema Skin: warm and dry Neuro: no focal abnormalities noted  EKG: EKG was ordered today 03/14/16-sinus rhythm, 72  12/28/13-sinus rhythm rate 68 with no other specific abnormalities, Labs: 09/16/13-creatinine 0.92, glucose 130, hemoglobin 14.6, LDL 106, small LDL particle size 520 within normal range.  Nuclear stress test 03/30/15: Oh risk, no ischemia, normal EF  ASSESSMENT AND PLAN:   1. Hyperlipidemia-medications reviewed. He has had statin intolerance. Currently on Niaspan, aspirin) flushing. Continue with exercise.  lipid panel unchanged. LDL small particle size excellent previously.going to check lipids.  2. Family history of CAD-brother in 2015 had bypass surgery. He was a former smoker. Continue to modify risk factors. 3. Tobacco use-excellent job at tobacco cessation over the last 5 months. Continue. 4. One year followup.  5. Crohn's disease is stable on Q8 week Remicade. 6. Superficial thrombophlebitis - he  has had recurring episodes. In fact he has thrombosis at the Remicade infusion site recently. He is wondering this is happening. I will refer to hematology for further evaluation.  Signed, [name], MD [facility]  03/14/2016 8:47 AM

## 2016-04-10 ENCOUNTER — Other Ambulatory Visit (HOSPITAL_COMMUNITY): Payer: Self-pay | Admitting: *Deleted

## 2016-04-11 ENCOUNTER — Ambulatory Visit (HOSPITAL_COMMUNITY)
Admission: RE | Admit: 2016-04-11 | Discharge: 2016-04-11 | Disposition: A | Discharge status: Home / Self Care | Payer: 59 | Source: Ambulatory Visit | Attending: Internal Medicine | Admitting: Internal Medicine

## 2016-04-11 DIAGNOSIS — K5190 Ulcerative colitis, unspecified, without complications: Secondary | ICD-10-CM | POA: Insufficient documentation

## 2016-04-11 LAB — CBC WITH DIFFERENTIAL/PLATELET
BASOS ABS: 0 10*3/uL (ref 0.0–0.1)
BASOS PCT: 0 %
EOS ABS: 0.2 10*3/uL (ref 0.0–0.7)
Eosinophils Relative: 4 %
HCT: 42 % (ref 39.0–52.0)
HEMOGLOBIN: 14.2 g/dL (ref 13.0–17.0)
Lymphocytes Relative: 32 %
Lymphs Abs: 1.6 10*3/uL (ref 0.7–4.0)
MCH: 28.9 pg (ref 26.0–34.0)
MCHC: 33.8 g/dL (ref 30.0–36.0)
MCV: 85.4 fL (ref 78.0–100.0)
MONOS PCT: 11 %
Monocytes Absolute: 0.6 10*3/uL (ref 0.1–1.0)
NEUTROS PCT: 53 %
Neutro Abs: 2.7 10*3/uL (ref 1.7–7.7)
Platelets: 187 10*3/uL (ref 150–400)
RBC: 4.92 MIL/uL (ref 4.22–5.81)
RDW: 13.1 % (ref 11.5–15.5)
WBC: 5.2 10*3/uL (ref 4.0–10.5)

## 2016-04-11 LAB — COMPREHENSIVE METABOLIC PANEL
ALBUMIN: 3.8 g/dL (ref 3.5–5.0)
ALK PHOS: 64 U/L (ref 38–126)
ALT: 29 U/L (ref 17–63)
ANION GAP: 7 (ref 5–15)
AST: 26 U/L (ref 15–41)
BUN: 15 mg/dL (ref 6–20)
CO2: 24 mmol/L (ref 22–32)
Calcium: 9.3 mg/dL (ref 8.9–10.3)
Chloride: 108 mmol/L (ref 101–111)
Creatinine, Ser: 1.06 mg/dL (ref 0.61–1.24)
GFR calc Af Amer: >60 mL/min (ref >60)
GFR calc non Af Amer: >60 mL/min (ref >60)
GLUCOSE: 97 mg/dL (ref 65–99)
POTASSIUM: 3.8 mmol/L (ref 3.5–5.1)
SODIUM: 139 mmol/L (ref 135–145)
Total Bilirubin: 0.8 mg/dL (ref 0.3–1.2)
Total Protein: 6.8 g/dL (ref 6.5–8.1)

## 2016-04-11 MED ORDER — METHYLPREDNISOLONE SODIUM SUCC 40 MG IJ SOLR
INTRAMUSCULAR | Status: AC
  Filled 2016-04-11: qty 1

## 2016-04-11 MED ORDER — SODIUM CHLORIDE 0.9 % IV SOLN
7.5000 mg/kg | INTRAVENOUS | Status: AC
  Administered 2016-04-11: 700 mg via INTRAVENOUS
  Filled 2016-04-11: qty 70

## 2016-04-11 MED ORDER — DIPHENHYDRAMINE HCL 50 MG/ML IJ SOLN
50.0000 mg | Freq: Once | INTRAMUSCULAR | Status: AC
  Administered 2016-04-11: 50 mg via INTRAVENOUS

## 2016-04-11 MED ORDER — DIPHENHYDRAMINE HCL 50 MG/ML IJ SOLN
INTRAMUSCULAR | Status: AC
  Filled 2016-04-11: qty 1

## 2016-04-11 MED ORDER — ACETAMINOPHEN 325 MG PO TABS
ORAL_TABLET | ORAL | Status: AC
  Filled 2016-04-11: qty 2

## 2016-04-11 MED ORDER — ACETAMINOPHEN 325 MG PO TABS
650.0000 mg | ORAL_TABLET | ORAL | Status: DC
  Administered 2016-04-11: 650 mg via ORAL

## 2016-04-11 MED ORDER — METHYLPREDNISOLONE SODIUM SUCC 40 MG IJ SOLR
40.0000 mg | Freq: Once | INTRAMUSCULAR | Status: AC
  Administered 2016-04-11: 40 mg via INTRAVENOUS

## 2016-04-11 MED ORDER — SODIUM CHLORIDE 0.9 % IV SOLN
INTRAVENOUS | Status: DC
Start: 2016-04-11 — End: 2016-04-12
  Administered 2016-04-11: 09:00:00 via INTRAVENOUS

## 2016-04-17 ENCOUNTER — Ambulatory Visit (HOSPITAL_BASED_OUTPATIENT_CLINIC_OR_DEPARTMENT_OTHER): Payer: 59 | Admitting: Oncology

## 2016-04-17 ENCOUNTER — Telehealth: Payer: Self-pay | Admitting: Oncology

## 2016-04-17 ENCOUNTER — Ambulatory Visit (HOSPITAL_BASED_OUTPATIENT_CLINIC_OR_DEPARTMENT_OTHER): Payer: 59

## 2016-04-17 VITALS — BP 127/75 | HR 69 | Temp 98.2°F | Resp 18 | Wt 202.0 lb

## 2016-04-17 DIAGNOSIS — Z7982 Long term (current) use of aspirin: Secondary | ICD-10-CM | POA: Diagnosis not present

## 2016-04-17 DIAGNOSIS — I8002 Phlebitis and thrombophlebitis of superficial vessels of left lower extremity: Secondary | ICD-10-CM | POA: Diagnosis not present

## 2016-04-17 DIAGNOSIS — I8290 Acute embolism and thrombosis of unspecified vein: Secondary | ICD-10-CM

## 2016-04-17 NOTE — Progress Notes (Signed)
Reason for Referral: Superficial phlebitis.   HPI: 45 year old gentleman currently of [place] but has lived in multiple locations. His gentleman with history of Crohn's disease for the last 20 years and have been on Remicade for the last 14 years. He was in his usual state of health until about 6 months ago after he quit smoking he developed a first episode of superficial phlebitis. He noted some erythema and pain in the anterior aspect of his left thigh. This was noted after a long flight and was treated with anti-inflammatory medication. He had 2 more episodes in the last 4 months or on the left thigh and all treated with anti-inflammatory medication. He did have ultrasound Doppler and rule out deep vein thrombosis. He also have noted recently his peripheral veins thrombosis easily after infusion of Remicade. Clinically he has no other signs or symptoms of thrombosis. She denied any history of deep pain thrombosis, pulmonary embolism or coronary disease. He has been checked by cardiology because of family history of coronary disease. His father died at a young age and he is taken cholesterol medication and aspirin for preventative purposes. He denied any weight loss or other constitutional symptoms. He denied taking any hormonal supplements.  He does not report any headaches, blurry vision, syncope or seizures. He does not report any fevers, chills or sweats. He does not report any cough, wheezing or hemoptysis. He does not report any nausea, vomiting or abdominal pain. He does not report any frequency urgency or hesitancy. He does not report any skeletal complaints of arthralgias or myalgias. Remaining review of systems unremarkable.   Past Medical History:  Diagnosis Date  . Crohn's disease ([place])   . Hyperlipidemia   :  No past surgical history on file.:   Current Outpatient Prescriptions:  .  aspirin 81 MG tablet, Take 162 mg by mouth daily.  , Disp: , Rfl:  .  colesevelam (WELCHOL) 625 MG  tablet, Take 2 tablets (1,250 mg total) by mouth 2 (two) times daily with a meal., Disp: 360 tablet, Rfl: 3 .  InFLIXimab (REMICADE IV), Inject into the vein every 8 (eight) weeks.  , Disp: , Rfl:  .  niacin (NIASPAN) 1000 MG CR tablet, Take 2 tablets by mouth at  bedtime, Disp: 180 tablet, Rfl: 1:  Allergies  Allergen Reactions  . Statins Other (See Comments)    Chest pain  :  Family History  Problem Relation Age of Onset  . Heart disease Mother   :  Social History   Social History  . Marital status: Married    Spouse name: N/A  . Number of children: N/A  . Years of education: N/A   Occupational History  . Not on file.   Social History Main Topics  . Smoking status: Current Every Day Smoker    Packs/day: 0.50    Types: Cigarettes  . Smokeless tobacco: Not on file  . Alcohol use No  . Drug use: No  . Sexual activity: Not on file   Other Topics Concern  . Not on file   Social History Narrative  . No narrative on file  :  Pertinent items are noted in HPI.  Exam: Blood pressure 127/75, pulse 69, temperature 98.2 F (36.8 C), temperature source Oral, resp. rate 18, weight 202 lb (91.6 kg), SpO2 99 %. ECOG 0 General appearance: alert and cooperative appeared without distress. Head: Normocephalic, without obvious abnormality no oral ulcers or lesions. Throat: lips, mucosa, and tongue normal; teeth and gums normal  Neck: no adenopathy Back: negative Resp: clear to auscultation bilaterally no dullness to percussion. Chest wall: no tenderness Cardio: regular rate and rhythm, S1, S2 normal, no murmur, click, rub or gallop GI: soft, non-tender; bowel sounds normal; no masses,  no organomegaly Extremities: extremities normal, atraumatic, no cyanosis or edema. I could not appreciate any phlebitis. Skin: Skin color, texture, turgor normal. No rashes or lesions Lymph nodes: Cervical, supraclavicular, and axillary nodes normal.    Assessment and Plan:   45 year old  gentleman with recurrent superficial phlebitis in his left leg noted for the last 6 months. He has been treated with anti-inflammatory and pain medication.  The natural course of recurrent thrombosis history was discussed as well as I reviewed his risk factors. Inherited thrombophilia would be a consideration given his young age and recurrent thrombosis. He is of Mediterranean descent and factor V Leiden mutation is less likely in this population. Other inherited thrombophilia would be considerations such as antithrombin III deficiency among others. Acquired lupus anticoagulant could also be a possibility and these to be investigated.  Other acquired conditions that could be contributing to his recurrent phlebitis would be Crohn's disease and malignancy. I have recommended imaging studies of the abdomen and pelvis to rule out any malignancy or anatomical abnormalities that could be contributing.  From a management standpoint, I have recommended adequate hydration prior to his long flights, increased mobility during those flights and continuing aspirin. Full dose anticoagulation might be indicated if he develops deep vein thrombosis or superficial phlebitis that is more extensive. Also he has inherited thrombophilia, more consideration for stronger anticoagulation may be indicated.  The plan at this point is to obtain a hypercoagulable workup and we'll defer imaging studies to a later date based on his preference. He will return in a few weeks to discuss these results.  All his questions were answered today to his satisfaction.

## 2016-04-17 NOTE — Telephone Encounter (Signed)
Gave pt cal & avs °

## 2016-04-19 LAB — PROTEIN C ACTIVITY: PROTEIN C ACTIVITY: 104 % (ref 73–180)

## 2016-04-19 LAB — ANTITHROMBIN III: ANTITHROMBIN ACTIVITY: 108 % (ref 75–135)

## 2016-04-19 LAB — BETA-2-GLYCOPROTEIN I ABS, IGG/M/A
Beta-2 Glyco 1 IgA: <9 GPI IgA units (ref 0–25)
Beta-2 Glyco 1 IgM: <9 GPI IgM units (ref 0–32)

## 2016-04-19 LAB — CARDIOLIPIN ANTIBODIES, IGG, IGM, IGA: Anticardiolipin Ab,IgM,Qn: 21 MPL U/mL — ABNORMAL HIGH (ref 0–12)

## 2016-04-19 LAB — PROTEIN S ACTIVITY: Protein S-Functional: 97 % (ref 63–140)

## 2016-04-19 LAB — PROTEIN S, TOTAL: Protein S, Total: 96 % (ref 60–150)

## 2016-04-21 LAB — LUPUS ANTICOAGULANT PANEL
PTT-LA: 43.3 s (ref 0.0–51.9)
dRVVT: 44.2 s (ref 0.0–47.0)

## 2016-04-21 LAB — PROTEIN C, TOTAL: Protein C Antigen: 79 % (ref 60–150)

## 2016-04-22 ENCOUNTER — Ambulatory Visit: Payer: 59 | Admitting: Oncology

## 2016-04-22 LAB — PROTHROMBIN GENE MUTATION

## 2016-04-23 LAB — FACTOR 5 LEIDEN

## 2016-05-09 ENCOUNTER — Telehealth: Payer: Self-pay | Admitting: *Deleted

## 2016-05-09 NOTE — Telephone Encounter (Signed)
Received call from pt stating that he received a call regarding an appt on tues & wonders why he needs to come in.  He states that he would prefer that Dr Alen Blew call him @ [phone] & if labs are normal, he would prefer not to come in & handle over the phone d/t his busy schedule. Message to Dr Philippa Sicks

## 2016-05-13 ENCOUNTER — Ambulatory Visit (HOSPITAL_BASED_OUTPATIENT_CLINIC_OR_DEPARTMENT_OTHER): Payer: 59 | Admitting: Oncology

## 2016-05-13 VITALS — BP 127/62 | HR 72 | Temp 98.0°F | Resp 18 | Ht 68.0 in | Wt 204.0 lb

## 2016-05-13 DIAGNOSIS — I8002 Phlebitis and thrombophlebitis of superficial vessels of left lower extremity: Secondary | ICD-10-CM | POA: Diagnosis not present

## 2016-05-13 DIAGNOSIS — I809 Phlebitis and thrombophlebitis of unspecified site: Secondary | ICD-10-CM

## 2016-05-13 NOTE — Progress Notes (Signed)
Hematology and Oncology Follow Up Visit  Jose [name] [id number] November 12, 1970 45 y.o. 05/13/2016 9:59 AM Velna Ochs, MDRostand, Robert, MD   Principle Diagnosis: 45 year old gentleman with recurrent superficial phlebitis the most recent of which was in April 2017. No clear-cut etiology identified. His hypocoagulable panel is unremarkable.   Prior Therapy:  anti-inflammatories and supportive care.  Current therapy: Observation and surveillance.  Interim History:  Patient presents today for a follow-up visit. Since the last visit, he reports no recent complaints. He denied any recurrent thrombosis or bleeding episodes. He denies any abdominal pain, weight loss or constitutional symptoms. He remains in excellent health and shape and performs activities of daily living.  He does not report any headaches, blurry vision, syncope or seizures. He does not report any fevers, chills or sweats. He does not report any cough, wheezing or hemoptysis. He does not report any nausea, vomiting or abdominal pain. He does not report any frequency urgency or hesitancy. He does not report any skeletal complaints of arthralgias or myalgias. Remaining review of systems unremarkable  Medications: I have reviewed the patient's current medications.  Current Outpatient Prescriptions  Medication Sig Dispense Refill  . aspirin 81 MG tablet Take 162 mg by mouth daily.      . colesevelam (WELCHOL) 625 MG tablet Take 2 tablets (1,250 mg total) by mouth 2 (two) times daily with a meal. 360 tablet 3  . InFLIXimab (REMICADE IV) Inject into the vein every 8 (eight) weeks.      . niacin (NIASPAN) 1000 MG CR tablet Take 2 tablets by mouth at  bedtime 180 tablet 1   No current facility-administered medications for this visit.      Allergies:  Allergies  Allergen Reactions  . Statins Other (See Comments)    Chest pain    Past Medical History, Surgical history, Social history, and Family History were reviewed and  updated.   Physical Exam: Blood pressure 127/62, pulse 72, temperature 98 F (36.7 C), temperature source Oral, resp. rate 18, height 5\' 8"  (1.727 m), weight 204 lb (92.5 kg), SpO2 99 %. ECOG: 0 General appearance: alert and cooperative Head: Normocephalic, without obvious abnormality Neck: no adenopathy Lymph nodes: Cervical, supraclavicular, and axillary nodes normal. Heart:regular rate and rhythm, S1, S2 normal, no murmur, click, rub or gallop Lung:chest clear, no wheezing, rales, normal symmetric air entry, Heart exam - S1, S2 normal, no murmur, no gallop, rate regular Abdomin: soft, non-tender, without masses or organomegaly EXT:no erythema, induration, or nodules   Lab Results: Lab Results  Component Value Date   WBC 5.2 04/11/2016   HGB 14.2 04/11/2016   HCT 42.0 04/11/2016   MCV 85.4 04/11/2016   PLT 187 04/11/2016     Chemistry      Component Value Date/Time   NA 139 04/11/2016 0857   K 3.8 04/11/2016 0857   CL 108 04/11/2016 0857   CO2 24 04/11/2016 0857   BUN 15 04/11/2016 0857   CREATININE 1.06 04/11/2016 0857      Component Value Date/Time   CALCIUM 9.3 04/11/2016 0857   ALKPHOS 64 04/11/2016 0857   AST 26 04/11/2016 0857   ALT 29 04/11/2016 0857   BILITOT 0.8 04/11/2016 0857         Impression and Plan:  45 year old gentleman with recurrent superficial phlebitis in his left leg noted for the last 6 months. He has been treated with anti-inflammatory and pain medication.  His hypocoagulable panel obtained in August 2017 was personally reviewed and showed no  evidence of inherited or acquired thrombophilia. The etiology of his superficial phlebitis remains unclear and management options were reviewed.  I've recommended CT scan of the abdomen and pelvis to rule out occult malignancy. The likelihood of this is low the fact that he has no symptoms. After discussion today he deferred that option to a later date. He will contact me if he develops any  recurrent superficial phlebitis or if he develops any symptoms. If any of these occur, we will arrange for CT scan for him at that time.  He will follow-up with me as needed. All his questions were answered to his satisfaction.   [facility], MD 8/29/20179:59 AM

## 2016-05-24 ENCOUNTER — Other Ambulatory Visit: Payer: Self-pay | Admitting: Cardiology

## 2016-06-12 ENCOUNTER — Other Ambulatory Visit (HOSPITAL_COMMUNITY): Payer: Self-pay | Admitting: *Deleted

## 2016-06-13 ENCOUNTER — Ambulatory Visit (HOSPITAL_COMMUNITY)
Admission: RE | Admit: 2016-06-13 | Discharge: 2016-06-13 | Disposition: A | Discharge status: Home / Self Care | Payer: 59 | Source: Ambulatory Visit | Attending: Gastroenterology | Admitting: Gastroenterology

## 2016-06-13 DIAGNOSIS — K5010 Crohn's disease of large intestine without complications: Secondary | ICD-10-CM | POA: Insufficient documentation

## 2016-06-13 MED ORDER — INFLIXIMAB 100 MG IV SOLR
7.5000 mg/kg | INTRAVENOUS | Status: DC
  Administered 2016-06-13: 700 mg via INTRAVENOUS
  Filled 2016-06-13: qty 30

## 2016-06-13 MED ORDER — METHYLPREDNISOLONE SODIUM SUCC 40 MG IJ SOLR
INTRAMUSCULAR | Status: AC
  Filled 2016-06-13: qty 1

## 2016-06-13 MED ORDER — SODIUM CHLORIDE 0.9 % IV SOLN
INTRAVENOUS | Status: DC
  Administered 2016-06-13: 09:00:00 via INTRAVENOUS

## 2016-06-13 MED ORDER — ACETAMINOPHEN 325 MG PO TABS
650.0000 mg | ORAL_TABLET | ORAL | Status: DC
  Administered 2016-06-13: 650 mg via ORAL

## 2016-06-13 MED ORDER — DIPHENHYDRAMINE HCL 50 MG/ML IJ SOLN
INTRAMUSCULAR | Status: AC
  Filled 2016-06-13: qty 1

## 2016-06-13 MED ORDER — METHYLPREDNISOLONE SODIUM SUCC 40 MG IJ SOLR
40.0000 mg | INTRAMUSCULAR | Status: DC
  Administered 2016-06-13: 40 mg via INTRAVENOUS

## 2016-06-13 MED ORDER — ACETAMINOPHEN 325 MG PO TABS
ORAL_TABLET | ORAL | Status: AC
  Filled 2016-06-13: qty 2

## 2016-06-13 MED ORDER — DIPHENHYDRAMINE HCL 50 MG/ML IJ SOLN
50.0000 mg | INTRAMUSCULAR | Status: DC
  Administered 2016-06-13: 50 mg via INTRAVENOUS

## 2016-08-06 ENCOUNTER — Other Ambulatory Visit (HOSPITAL_COMMUNITY): Payer: Self-pay | Admitting: *Deleted

## 2016-08-08 ENCOUNTER — Ambulatory Visit (HOSPITAL_COMMUNITY)
Admission: RE | Admit: 2016-08-08 | Discharge: 2016-08-08 | Disposition: A | Discharge status: Home / Self Care | Payer: 59 | Source: Ambulatory Visit | Attending: Gastroenterology | Admitting: Gastroenterology

## 2016-08-08 DIAGNOSIS — K5090 Crohn's disease, unspecified, without complications: Secondary | ICD-10-CM | POA: Diagnosis present

## 2016-08-08 LAB — COMPREHENSIVE METABOLIC PANEL
ALT: 38 U/L (ref 17–63)
AST: 48 U/L — AB (ref 15–41)
Albumin: 3.7 g/dL (ref 3.5–5.0)
Alkaline Phosphatase: 68 U/L (ref 38–126)
Anion gap: 8 (ref 5–15)
BILIRUBIN TOTAL: 0.4 mg/dL (ref 0.3–1.2)
BUN: 13 mg/dL (ref 6–20)
CHLORIDE: 108 mmol/L (ref 101–111)
CO2: 24 mmol/L (ref 22–32)
CREATININE: 1.08 mg/dL (ref 0.61–1.24)
Calcium: 9.1 mg/dL (ref 8.9–10.3)
GFR calc Af Amer: >60 mL/min (ref >60)
GLUCOSE: 81 mg/dL (ref 65–99)
Potassium: 4 mmol/L (ref 3.5–5.1)
Sodium: 140 mmol/L (ref 135–145)
TOTAL PROTEIN: 6.7 g/dL (ref 6.5–8.1)

## 2016-08-08 LAB — CBC WITH DIFFERENTIAL/PLATELET
BASOS ABS: 0 10*3/uL (ref 0.0–0.1)
Basophils Relative: 1 %
Eosinophils Absolute: 0.2 10*3/uL (ref 0.0–0.7)
Eosinophils Relative: 4 %
HEMATOCRIT: 43.9 % (ref 39.0–52.0)
Hemoglobin: 14.6 g/dL (ref 13.0–17.0)
LYMPHS PCT: 36 %
Lymphs Abs: 1.6 10*3/uL (ref 0.7–4.0)
MCH: 28.5 pg (ref 26.0–34.0)
MCHC: 33.3 g/dL (ref 30.0–36.0)
MCV: 85.6 fL (ref 78.0–100.0)
Monocytes Absolute: 0.5 10*3/uL (ref 0.1–1.0)
Monocytes Relative: 12 %
NEUTROS ABS: 2.1 10*3/uL (ref 1.7–7.7)
NEUTROS PCT: 47 %
Platelets: 191 10*3/uL (ref 150–400)
RBC: 5.13 MIL/uL (ref 4.22–5.81)
RDW: 13 % (ref 11.5–15.5)
WBC: 4.3 10*3/uL (ref 4.0–10.5)

## 2016-08-08 MED ORDER — DIPHENHYDRAMINE HCL 50 MG/ML IJ SOLN
50.0000 mg | INTRAMUSCULAR | Status: DC
  Administered 2016-08-08: 50 mg via INTRAVENOUS

## 2016-08-08 MED ORDER — SODIUM CHLORIDE 0.9 % IV SOLN: INTRAVENOUS | Status: DC

## 2016-08-08 MED ORDER — METHYLPREDNISOLONE SODIUM SUCC 40 MG IJ SOLR
INTRAMUSCULAR | Status: AC
  Filled 2016-08-08: qty 1

## 2016-08-08 MED ORDER — SODIUM CHLORIDE 0.9 % IV SOLN
7.5000 mg/kg | INTRAVENOUS | Status: DC
  Administered 2016-08-08: 700 mg via INTRAVENOUS
  Filled 2016-08-08: qty 70

## 2016-08-08 MED ORDER — ACETAMINOPHEN 325 MG PO TABS
ORAL_TABLET | ORAL | Status: AC
  Filled 2016-08-08: qty 2

## 2016-08-08 MED ORDER — DIPHENHYDRAMINE HCL 50 MG/ML IJ SOLN
INTRAMUSCULAR | Status: AC
  Filled 2016-08-08: qty 1

## 2016-08-08 MED ORDER — METHYLPREDNISOLONE SODIUM SUCC 40 MG IJ SOLR
40.0000 mg | INTRAMUSCULAR | Status: DC
  Administered 2016-08-08: 40 mg via INTRAVENOUS

## 2016-08-08 MED ORDER — ACETAMINOPHEN 325 MG PO TABS
650.0000 mg | ORAL_TABLET | ORAL | Status: DC
  Administered 2016-08-08: 650 mg via ORAL

## 2016-09-16 DIAGNOSIS — L309 Dermatitis, unspecified: Secondary | ICD-10-CM | POA: Diagnosis not present

## 2016-10-01 ENCOUNTER — Other Ambulatory Visit (HOSPITAL_COMMUNITY): Payer: Self-pay | Admitting: *Deleted

## 2016-10-03 ENCOUNTER — Encounter (HOSPITAL_COMMUNITY)
Admission: RE | Admit: 2016-10-03 | Discharge: 2016-10-03 | Disposition: A | Discharge status: Home / Self Care | Payer: 59 | Source: Ambulatory Visit | Attending: Gastroenterology | Admitting: Gastroenterology

## 2016-10-03 DIAGNOSIS — K5090 Crohn's disease, unspecified, without complications: Secondary | ICD-10-CM | POA: Insufficient documentation

## 2016-10-03 LAB — COMPREHENSIVE METABOLIC PANEL
ALBUMIN: 4 g/dL (ref 3.5–5.0)
ALK PHOS: 65 U/L (ref 38–126)
ALT: 35 U/L (ref 17–63)
ANION GAP: 9 (ref 5–15)
AST: 38 U/L (ref 15–41)
BILIRUBIN TOTAL: 0.7 mg/dL (ref 0.3–1.2)
BUN: 13 mg/dL (ref 6–20)
CALCIUM: 9.5 mg/dL (ref 8.9–10.3)
CO2: 26 mmol/L (ref 22–32)
Chloride: 104 mmol/L (ref 101–111)
Creatinine, Ser: 1.22 mg/dL (ref 0.61–1.24)
GFR calc non Af Amer: >60 mL/min (ref >60)
GLUCOSE: 99 mg/dL (ref 65–99)
POTASSIUM: 4.1 mmol/L (ref 3.5–5.1)
SODIUM: 139 mmol/L (ref 135–145)
TOTAL PROTEIN: 7 g/dL (ref 6.5–8.1)

## 2016-10-03 MED ORDER — SODIUM CHLORIDE 0.9 % IV SOLN
Freq: Once | INTRAVENOUS | Status: AC
  Administered 2016-10-03: 09:00:00 via INTRAVENOUS

## 2016-10-03 MED ORDER — METHYLPREDNISOLONE SODIUM SUCC 40 MG IJ SOLR
40.0000 mg | Freq: Once | INTRAMUSCULAR | Status: AC
  Administered 2016-10-03: 40 mg via INTRAVENOUS

## 2016-10-03 MED ORDER — ACETAMINOPHEN 325 MG PO TABS
ORAL_TABLET | ORAL | Status: AC
  Filled 2016-10-03: qty 1

## 2016-10-03 MED ORDER — DIPHENHYDRAMINE HCL 50 MG/ML IJ SOLN
50.0000 mg | Freq: Once | INTRAMUSCULAR | Status: AC
  Administered 2016-10-03: 50 mg via INTRAVENOUS

## 2016-10-03 MED ORDER — METHYLPREDNISOLONE SODIUM SUCC 40 MG IJ SOLR
INTRAMUSCULAR | Status: AC
  Filled 2016-10-03: qty 1

## 2016-10-03 MED ORDER — ACETAMINOPHEN 325 MG PO TABS
650.0000 mg | ORAL_TABLET | Freq: Once | ORAL | Status: AC
  Administered 2016-10-03: 650 mg via ORAL

## 2016-10-03 MED ORDER — ACETAMINOPHEN 325 MG PO TABS
ORAL_TABLET | ORAL | Status: AC
  Filled 2016-10-03: qty 2

## 2016-10-03 MED ORDER — DIPHENHYDRAMINE HCL 25 MG PO CAPS: 50.0000 mg | ORAL_CAPSULE | Freq: Once | ORAL | Status: DC

## 2016-10-03 MED ORDER — DIPHENHYDRAMINE HCL 50 MG/ML IJ SOLN
INTRAMUSCULAR | Status: AC
  Filled 2016-10-03: qty 1

## 2016-10-03 MED ORDER — INFLIXIMAB 100 MG IV SOLR
7.5000 mg/kg | Freq: Once | INTRAVENOUS | Status: AC
  Administered 2016-10-03: 700 mg via INTRAVENOUS
  Filled 2016-10-03: qty 70

## 2016-10-13 DIAGNOSIS — J3489 Other specified disorders of nose and nasal sinuses: Secondary | ICD-10-CM | POA: Diagnosis not present

## 2016-10-16 DIAGNOSIS — R05 Cough: Secondary | ICD-10-CM | POA: Diagnosis not present

## 2016-10-16 DIAGNOSIS — K219 Gastro-esophageal reflux disease without esophagitis: Secondary | ICD-10-CM | POA: Diagnosis not present

## 2016-10-20 DIAGNOSIS — J0110 Acute frontal sinusitis, unspecified: Secondary | ICD-10-CM | POA: Diagnosis not present

## 2016-10-23 DIAGNOSIS — E049 Nontoxic goiter, unspecified: Secondary | ICD-10-CM | POA: Diagnosis not present

## 2016-10-23 DIAGNOSIS — E782 Mixed hyperlipidemia: Secondary | ICD-10-CM | POA: Diagnosis not present

## 2016-10-23 DIAGNOSIS — K5080 Crohn's disease of both small and large intestine without complications: Secondary | ICD-10-CM | POA: Diagnosis not present

## 2016-11-07 DIAGNOSIS — R05 Cough: Secondary | ICD-10-CM | POA: Diagnosis not present

## 2016-11-28 ENCOUNTER — Encounter (HOSPITAL_COMMUNITY)
Admission: RE | Admit: 2016-11-28 | Discharge: 2016-11-28 | Disposition: A | Discharge status: Home / Self Care | Payer: 59 | Source: Ambulatory Visit | Attending: Gastroenterology | Admitting: Gastroenterology

## 2016-11-28 DIAGNOSIS — K5090 Crohn's disease, unspecified, without complications: Secondary | ICD-10-CM | POA: Insufficient documentation

## 2016-11-28 LAB — COMPREHENSIVE METABOLIC PANEL
ALBUMIN: 4.1 g/dL (ref 3.5–5.0)
ALK PHOS: 69 U/L (ref 38–126)
ALT: 33 U/L (ref 17–63)
AST: 35 U/L (ref 15–41)
Anion gap: 10 (ref 5–15)
BUN: 13 mg/dL (ref 6–20)
CHLORIDE: 105 mmol/L (ref 101–111)
CO2: 25 mmol/L (ref 22–32)
CREATININE: 1.11 mg/dL (ref 0.61–1.24)
Calcium: 9.7 mg/dL (ref 8.9–10.3)
GFR calc non Af Amer: >60 mL/min (ref >60)
GLUCOSE: 108 mg/dL — AB (ref 65–99)
Potassium: 4 mmol/L (ref 3.5–5.1)
SODIUM: 140 mmol/L (ref 135–145)
Total Bilirubin: 0.5 mg/dL (ref 0.3–1.2)
Total Protein: 7.6 g/dL (ref 6.5–8.1)

## 2016-11-28 LAB — CBC WITH DIFFERENTIAL/PLATELET
Basophils Absolute: 0 10*3/uL (ref 0.0–0.1)
Basophils Relative: 1 %
EOS ABS: 0.5 10*3/uL (ref 0.0–0.7)
Eosinophils Relative: 6 %
HEMATOCRIT: 44.9 % (ref 39.0–52.0)
HEMOGLOBIN: 15.5 g/dL (ref 13.0–17.0)
LYMPHS ABS: 2.1 10*3/uL (ref 0.7–4.0)
Lymphocytes Relative: 29 %
MCH: 29.9 pg (ref 26.0–34.0)
MCHC: 34.5 g/dL (ref 30.0–36.0)
MCV: 86.7 fL (ref 78.0–100.0)
MONO ABS: 0.5 10*3/uL (ref 0.1–1.0)
MONOS PCT: 7 %
NEUTROS ABS: 4.1 10*3/uL (ref 1.7–7.7)
NEUTROS PCT: 57 %
Platelets: 171 10*3/uL (ref 150–400)
RBC: 5.18 MIL/uL (ref 4.22–5.81)
RDW: 13.8 % (ref 11.5–15.5)
WBC: 7.3 10*3/uL (ref 4.0–10.5)

## 2016-11-28 MED ORDER — DIPHENHYDRAMINE HCL 50 MG/ML IJ SOLN: 50.0000 mg | INTRAMUSCULAR | Status: DC

## 2016-11-28 MED ORDER — DIPHENHYDRAMINE HCL 50 MG/ML IJ SOLN
INTRAMUSCULAR | Status: AC
  Administered 2016-11-28: 50 mg
  Filled 2016-11-28: qty 1

## 2016-11-28 MED ORDER — METHYLPREDNISOLONE SODIUM SUCC 40 MG IJ SOLR: 40.0000 mg | INTRAMUSCULAR | Status: DC

## 2016-11-28 MED ORDER — ACETAMINOPHEN 325 MG PO TABS: 650.0000 mg | ORAL_TABLET | ORAL | Status: DC

## 2016-11-28 MED ORDER — METHYLPREDNISOLONE SODIUM SUCC 40 MG IJ SOLR
INTRAMUSCULAR | Status: AC
  Administered 2016-11-28: 40 mg
  Filled 2016-11-28: qty 1

## 2016-11-28 MED ORDER — SODIUM CHLORIDE 0.9 % IV SOLN
7.5000 mg/kg | INTRAVENOUS | Status: DC
  Administered 2016-11-28: 700 mg via INTRAVENOUS
  Filled 2016-11-28: qty 70

## 2016-11-28 MED ORDER — SODIUM CHLORIDE 0.9 % IV SOLN
INTRAVENOUS | Status: DC
  Administered 2016-11-28: 09:00:00 via INTRAVENOUS

## 2016-11-28 MED ORDER — ACETAMINOPHEN 325 MG PO TABS
ORAL_TABLET | ORAL | Status: AC
  Administered 2016-11-28: 650 mg
  Filled 2016-11-28: qty 2

## 2017-01-06 ENCOUNTER — Telehealth: Payer: Self-pay | Admitting: Cardiology

## 2017-01-06 DIAGNOSIS — Z79899 Other long term (current) drug therapy: Secondary | ICD-10-CM

## 2017-01-06 DIAGNOSIS — E785 Hyperlipidemia, unspecified: Secondary | ICD-10-CM

## 2017-01-06 NOTE — Telephone Encounter (Signed)
Jose [name] is calling because he wants to get his lab work done before his appt on 03/11/17.. Thanks

## 2017-01-06 NOTE — Telephone Encounter (Signed)
Pt had CBC and CMP 3/18 while in hospital.  He appears to be on Welchol and Niacin.  I do not see a recent Lipid profile.  appt is not until 6/18.  Will forward to Dr [name] for review and orders.

## 2017-01-06 NOTE — Telephone Encounter (Signed)
Please check lipid panel and ALT. Jose Furbish, MD

## 2017-01-21 ENCOUNTER — Other Ambulatory Visit (HOSPITAL_COMMUNITY): Payer: Self-pay | Admitting: *Deleted

## 2017-01-23 ENCOUNTER — Encounter (HOSPITAL_COMMUNITY)
Admission: RE | Admit: 2017-01-23 | Discharge: 2017-01-23 | Disposition: A | Discharge status: Home / Self Care | Payer: 59 | Source: Ambulatory Visit | Attending: Gastroenterology | Admitting: Gastroenterology

## 2017-01-23 DIAGNOSIS — K5090 Crohn's disease, unspecified, without complications: Secondary | ICD-10-CM | POA: Diagnosis present

## 2017-01-23 MED ORDER — METHYLPREDNISOLONE SODIUM SUCC 40 MG IJ SOLR
40.0000 mg | INTRAMUSCULAR | Status: DC
  Administered 2017-01-23: 40 mg via INTRAVENOUS

## 2017-01-23 MED ORDER — ACETAMINOPHEN 325 MG PO TABS
650.0000 mg | ORAL_TABLET | ORAL | Status: DC
  Administered 2017-01-23: 650 mg via ORAL

## 2017-01-23 MED ORDER — METHYLPREDNISOLONE SODIUM SUCC 40 MG IJ SOLR
INTRAMUSCULAR | Status: AC
  Administered 2017-01-23: 40 mg via INTRAVENOUS
  Filled 2017-01-23: qty 1

## 2017-01-23 MED ORDER — DIPHENHYDRAMINE HCL 50 MG/ML IJ SOLN
50.0000 mg | INTRAMUSCULAR | Status: DC
  Administered 2017-01-23: 50 mg via INTRAVENOUS

## 2017-01-23 MED ORDER — ACETAMINOPHEN 325 MG PO TABS
ORAL_TABLET | ORAL | Status: AC
  Administered 2017-01-23: 650 mg via ORAL
  Filled 2017-01-23: qty 2

## 2017-01-23 MED ORDER — SODIUM CHLORIDE 0.9 % IV SOLN
7.5000 mg/kg | INTRAVENOUS | Status: DC
  Administered 2017-01-23: 600 mg via INTRAVENOUS
  Filled 2017-01-23: qty 60

## 2017-01-23 MED ORDER — DIPHENHYDRAMINE HCL 50 MG/ML IJ SOLN
INTRAMUSCULAR | Status: AC
  Administered 2017-01-23: 50 mg via INTRAVENOUS
  Filled 2017-01-23: qty 1

## 2017-01-23 MED ORDER — SODIUM CHLORIDE 0.9 % IV SOLN
INTRAVENOUS | Status: DC
  Administered 2017-01-23: 09:00:00 via INTRAVENOUS

## 2017-02-17 ENCOUNTER — Encounter: Payer: Self-pay | Admitting: Cardiology

## 2017-03-04 ENCOUNTER — Other Ambulatory Visit: Payer: 59 | Admitting: *Deleted

## 2017-03-04 DIAGNOSIS — Z79899 Other long term (current) drug therapy: Secondary | ICD-10-CM | POA: Diagnosis not present

## 2017-03-04 DIAGNOSIS — E785 Hyperlipidemia, unspecified: Secondary | ICD-10-CM

## 2017-03-04 LAB — LIPID PANEL
CHOL/HDL RATIO: 4.1 ratio (ref 0.0–5.0)
Cholesterol, Total: 167 mg/dL (ref 100–199)
HDL: 41 mg/dL (ref >39)
LDL CALC: 101 mg/dL — AB (ref 0–99)
TRIGLYCERIDES: 126 mg/dL (ref 0–149)
VLDL Cholesterol Cal: 25 mg/dL (ref 5–40)

## 2017-03-04 LAB — ALT: ALT: 25 IU/L (ref 0–44)

## 2017-03-11 ENCOUNTER — Ambulatory Visit: Payer: 59 | Admitting: Cardiology

## 2017-03-19 ENCOUNTER — Other Ambulatory Visit (HOSPITAL_COMMUNITY): Payer: Self-pay | Admitting: *Deleted

## 2017-03-20 ENCOUNTER — Encounter (HOSPITAL_COMMUNITY)
Admission: RE | Admit: 2017-03-20 | Discharge: 2017-03-20 | Disposition: A | Discharge status: Home / Self Care | Payer: 59 | Source: Ambulatory Visit | Attending: Gastroenterology | Admitting: Gastroenterology

## 2017-03-20 DIAGNOSIS — K5090 Crohn's disease, unspecified, without complications: Secondary | ICD-10-CM | POA: Diagnosis not present

## 2017-03-20 LAB — CBC WITH DIFFERENTIAL/PLATELET
BASOS ABS: 0 10*3/uL (ref 0.0–0.1)
Basophils Relative: 0 %
EOS PCT: 4 %
Eosinophils Absolute: 0.2 10*3/uL (ref 0.0–0.7)
HCT: 43 % (ref 39.0–52.0)
Hemoglobin: 14.4 g/dL (ref 13.0–17.0)
LYMPHS ABS: 1.9 10*3/uL (ref 0.7–4.0)
LYMPHS PCT: 34 %
MCH: 29.1 pg (ref 26.0–34.0)
MCHC: 33.5 g/dL (ref 30.0–36.0)
MCV: 86.9 fL (ref 78.0–100.0)
Monocytes Absolute: 0.5 10*3/uL (ref 0.1–1.0)
Monocytes Relative: 9 %
NEUTROS ABS: 2.9 10*3/uL (ref 1.7–7.7)
NEUTROS PCT: 53 %
PLATELETS: 185 10*3/uL (ref 150–400)
RBC: 4.95 MIL/uL (ref 4.22–5.81)
RDW: 12.9 % (ref 11.5–15.5)
WBC: 5.4 10*3/uL (ref 4.0–10.5)

## 2017-03-20 LAB — COMPREHENSIVE METABOLIC PANEL
ALK PHOS: 70 U/L (ref 38–126)
ALT: 34 U/L (ref 17–63)
AST: 35 U/L (ref 15–41)
Albumin: 4 g/dL (ref 3.5–5.0)
Anion gap: 8 (ref 5–15)
BUN: 18 mg/dL (ref 6–20)
CALCIUM: 9.2 mg/dL (ref 8.9–10.3)
CHLORIDE: 109 mmol/L (ref 101–111)
CO2: 21 mmol/L — AB (ref 22–32)
CREATININE: 1.08 mg/dL (ref 0.61–1.24)
GFR calc Af Amer: >60 mL/min (ref >60)
Glucose, Bld: 114 mg/dL — ABNORMAL HIGH (ref 65–99)
Potassium: 3.9 mmol/L (ref 3.5–5.1)
Sodium: 138 mmol/L (ref 135–145)
Total Bilirubin: 0.6 mg/dL (ref 0.3–1.2)
Total Protein: 6.9 g/dL (ref 6.5–8.1)

## 2017-03-20 MED ORDER — ACETAMINOPHEN 325 MG PO TABS
ORAL_TABLET | ORAL | Status: AC
  Filled 2017-03-20: qty 2

## 2017-03-20 MED ORDER — INFLIXIMAB 100 MG IV SOLR
7.5000 mg/kg | INTRAVENOUS | Status: AC
  Administered 2017-03-20: 600 mg via INTRAVENOUS
  Filled 2017-03-20 (×2): qty 60

## 2017-03-20 MED ORDER — DIPHENHYDRAMINE HCL 50 MG/ML IJ SOLN
INTRAMUSCULAR | Status: AC
  Filled 2017-03-20: qty 1

## 2017-03-20 MED ORDER — SODIUM CHLORIDE 0.9 % IV SOLN: INTRAVENOUS | Status: DC

## 2017-03-20 MED ORDER — ACETAMINOPHEN 325 MG PO TABS
650.0000 mg | ORAL_TABLET | ORAL | Status: AC
  Administered 2017-03-20: 650 mg via ORAL

## 2017-03-20 MED ORDER — METHYLPREDNISOLONE SODIUM SUCC 40 MG IJ SOLR
40.0000 mg | INTRAMUSCULAR | Status: AC
  Administered 2017-03-20: 40 mg via INTRAVENOUS

## 2017-03-20 MED ORDER — DIPHENHYDRAMINE HCL 50 MG/ML IJ SOLN
50.0000 mg | INTRAMUSCULAR | Status: AC
  Administered 2017-03-20: 50 mg via INTRAVENOUS

## 2017-03-20 MED ORDER — METHYLPREDNISOLONE SODIUM SUCC 40 MG IJ SOLR
INTRAMUSCULAR | Status: AC
  Filled 2017-03-20: qty 1

## 2017-04-14 ENCOUNTER — Encounter: Payer: Self-pay | Admitting: *Deleted

## 2017-04-17 DIAGNOSIS — K5010 Crohn's disease of large intestine without complications: Secondary | ICD-10-CM | POA: Diagnosis not present

## 2017-05-14 ENCOUNTER — Other Ambulatory Visit (HOSPITAL_COMMUNITY): Payer: Self-pay | Admitting: *Deleted

## 2017-05-14 ENCOUNTER — Other Ambulatory Visit: Payer: Self-pay | Admitting: Cardiology

## 2017-05-15 ENCOUNTER — Encounter (HOSPITAL_COMMUNITY)
Admission: RE | Admit: 2017-05-15 | Discharge: 2017-05-15 | Disposition: A | Discharge status: Home / Self Care | Payer: 59 | Source: Ambulatory Visit | Attending: Gastroenterology | Admitting: Gastroenterology

## 2017-05-15 DIAGNOSIS — K5090 Crohn's disease, unspecified, without complications: Secondary | ICD-10-CM | POA: Diagnosis present

## 2017-05-15 MED ORDER — SODIUM CHLORIDE 0.9 % IV SOLN
7.5000 mg/kg | INTRAVENOUS | Status: DC
  Administered 2017-05-15: 700 mg via INTRAVENOUS
  Filled 2017-05-15: qty 70

## 2017-05-15 MED ORDER — ACETAMINOPHEN 325 MG PO TABS
ORAL_TABLET | ORAL | Status: AC
  Filled 2017-05-15: qty 2

## 2017-05-15 MED ORDER — ACETAMINOPHEN 325 MG PO TABS
650.0000 mg | ORAL_TABLET | ORAL | Status: DC
  Administered 2017-05-15: 650 mg via ORAL

## 2017-05-15 MED ORDER — METHYLPREDNISOLONE SODIUM SUCC 40 MG IJ SOLR
INTRAMUSCULAR | Status: AC
  Filled 2017-05-15: qty 1

## 2017-05-15 MED ORDER — DIPHENHYDRAMINE HCL 50 MG/ML IJ SOLN
INTRAMUSCULAR | Status: AC
  Filled 2017-05-15: qty 1

## 2017-05-15 MED ORDER — DIPHENHYDRAMINE HCL 50 MG/ML IJ SOLN
50.0000 mg | INTRAMUSCULAR | Status: DC
Start: 2017-05-15 — End: 2017-05-16
  Administered 2017-05-15: 50 mg via INTRAVENOUS

## 2017-05-15 MED ORDER — METHYLPREDNISOLONE SODIUM SUCC 40 MG IJ SOLR
40.0000 mg | INTRAMUSCULAR | Status: DC
  Administered 2017-05-15: 40 mg via INTRAVENOUS

## 2017-05-15 MED ORDER — SODIUM CHLORIDE 0.9 % IV SOLN: INTRAVENOUS | Status: DC

## 2017-06-15 ENCOUNTER — Ambulatory Visit: Payer: 59 | Admitting: Cardiology

## 2017-07-01 ENCOUNTER — Encounter: Payer: Self-pay | Admitting: Cardiology

## 2017-07-09 ENCOUNTER — Other Ambulatory Visit (HOSPITAL_COMMUNITY): Payer: Self-pay

## 2017-07-10 ENCOUNTER — Encounter (HOSPITAL_COMMUNITY)
Admission: RE | Admit: 2017-07-10 | Discharge: 2017-07-10 | Disposition: A | Discharge status: Home / Self Care | Payer: 59 | Source: Ambulatory Visit | Attending: Gastroenterology | Admitting: Gastroenterology

## 2017-07-10 DIAGNOSIS — K5090 Crohn's disease, unspecified, without complications: Secondary | ICD-10-CM | POA: Insufficient documentation

## 2017-07-10 LAB — CBC WITH DIFFERENTIAL/PLATELET
BASOS PCT: 0 %
Basophils Absolute: 0 10*3/uL (ref 0.0–0.1)
EOS PCT: 4 %
Eosinophils Absolute: 0.3 10*3/uL (ref 0.0–0.7)
HEMATOCRIT: 42.5 % (ref 39.0–52.0)
Hemoglobin: 14.3 g/dL (ref 13.0–17.0)
Lymphocytes Relative: 25 %
Lymphs Abs: 1.7 10*3/uL (ref 0.7–4.0)
MCH: 29.3 pg (ref 26.0–34.0)
MCHC: 33.6 g/dL (ref 30.0–36.0)
MCV: 87.1 fL (ref 78.0–100.0)
MONO ABS: 0.6 10*3/uL (ref 0.1–1.0)
MONOS PCT: 9 %
NEUTROS ABS: 4.1 10*3/uL (ref 1.7–7.7)
Neutrophils Relative %: 62 %
Platelets: 173 10*3/uL (ref 150–400)
RBC: 4.88 MIL/uL (ref 4.22–5.81)
RDW: 13 % (ref 11.5–15.5)
WBC: 6.7 10*3/uL (ref 4.0–10.5)

## 2017-07-10 LAB — COMPREHENSIVE METABOLIC PANEL
ALBUMIN: 3.8 g/dL (ref 3.5–5.0)
ALT: 25 U/L (ref 17–63)
ANION GAP: 5 (ref 5–15)
AST: 27 U/L (ref 15–41)
Alkaline Phosphatase: 72 U/L (ref 38–126)
BILIRUBIN TOTAL: 0.5 mg/dL (ref 0.3–1.2)
BUN: 13 mg/dL (ref 6–20)
CO2: 27 mmol/L (ref 22–32)
CREATININE: 1.22 mg/dL (ref 0.61–1.24)
Calcium: 9.1 mg/dL (ref 8.9–10.3)
Chloride: 105 mmol/L (ref 101–111)
GFR calc Af Amer: >60 mL/min (ref >60)
Glucose, Bld: 88 mg/dL (ref 65–99)
POTASSIUM: 4.2 mmol/L (ref 3.5–5.1)
Sodium: 137 mmol/L (ref 135–145)
Total Protein: 6.6 g/dL (ref 6.5–8.1)

## 2017-07-10 MED ORDER — METHYLPREDNISOLONE SODIUM SUCC 40 MG IJ SOLR
40.0000 mg | Freq: Once | INTRAMUSCULAR | Status: AC
  Administered 2017-07-10: 40 mg via INTRAVENOUS

## 2017-07-10 MED ORDER — METHYLPREDNISOLONE SODIUM SUCC 40 MG IJ SOLR
INTRAMUSCULAR | Status: AC
  Administered 2017-07-10: 40 mg via INTRAVENOUS
  Filled 2017-07-10: qty 1

## 2017-07-10 MED ORDER — SODIUM CHLORIDE 0.9 % IV SOLN
7.5000 mg/kg | INTRAVENOUS | Status: DC
  Administered 2017-07-10: 700 mg via INTRAVENOUS
  Filled 2017-07-10: qty 70

## 2017-07-10 MED ORDER — DIPHENHYDRAMINE HCL 50 MG/ML IJ SOLN
INTRAMUSCULAR | Status: AC
  Administered 2017-07-10: 50 mg via INTRAVENOUS
  Filled 2017-07-10: qty 1

## 2017-07-10 MED ORDER — ACETAMINOPHEN 325 MG PO TABS
650.0000 mg | ORAL_TABLET | Freq: Once | ORAL | Status: AC
  Administered 2017-07-10: 650 mg via ORAL

## 2017-07-10 MED ORDER — ACETAMINOPHEN 325 MG PO TABS
ORAL_TABLET | ORAL | Status: AC
  Administered 2017-07-10: 650 mg via ORAL
  Filled 2017-07-10: qty 2

## 2017-07-10 MED ORDER — DIPHENHYDRAMINE HCL 50 MG/ML IJ SOLN
50.0000 mg | Freq: Once | INTRAMUSCULAR | Status: AC
  Administered 2017-07-10: 50 mg via INTRAVENOUS

## 2017-07-15 ENCOUNTER — Ambulatory Visit: Payer: 59 | Admitting: Cardiology

## 2017-07-17 ENCOUNTER — Emergency Department (HOSPITAL_BASED_OUTPATIENT_CLINIC_OR_DEPARTMENT_OTHER): Payer: 59

## 2017-07-17 ENCOUNTER — Observation Stay (HOSPITAL_BASED_OUTPATIENT_CLINIC_OR_DEPARTMENT_OTHER)
Admission: EM | Admit: 2017-07-17 | Discharge: 2017-07-18 | Disposition: A | Discharge status: Home / Self Care | Payer: 59 | Attending: Internal Medicine | Admitting: Internal Medicine

## 2017-07-17 ENCOUNTER — Encounter (HOSPITAL_BASED_OUTPATIENT_CLINIC_OR_DEPARTMENT_OTHER): Payer: Self-pay | Admitting: *Deleted

## 2017-07-17 DIAGNOSIS — N182 Chronic kidney disease, stage 2 (mild): Secondary | ICD-10-CM | POA: Insufficient documentation

## 2017-07-17 DIAGNOSIS — I2699 Other pulmonary embolism without acute cor pulmonale: Secondary | ICD-10-CM | POA: Diagnosis not present

## 2017-07-17 DIAGNOSIS — Z79899 Other long term (current) drug therapy: Secondary | ICD-10-CM | POA: Insufficient documentation

## 2017-07-17 DIAGNOSIS — F17210 Nicotine dependence, cigarettes, uncomplicated: Secondary | ICD-10-CM | POA: Insufficient documentation

## 2017-07-17 DIAGNOSIS — R0789 Other chest pain: Secondary | ICD-10-CM | POA: Insufficient documentation

## 2017-07-17 DIAGNOSIS — Z7982 Long term (current) use of aspirin: Secondary | ICD-10-CM | POA: Diagnosis not present

## 2017-07-17 DIAGNOSIS — Z8672 Personal history of thrombophlebitis: Secondary | ICD-10-CM | POA: Insufficient documentation

## 2017-07-17 DIAGNOSIS — K5090 Crohn's disease, unspecified, without complications: Secondary | ICD-10-CM | POA: Insufficient documentation

## 2017-07-17 DIAGNOSIS — R05 Cough: Secondary | ICD-10-CM | POA: Diagnosis not present

## 2017-07-17 DIAGNOSIS — E785 Hyperlipidemia, unspecified: Secondary | ICD-10-CM | POA: Insufficient documentation

## 2017-07-17 DIAGNOSIS — Z720 Tobacco use: Secondary | ICD-10-CM | POA: Diagnosis present

## 2017-07-17 HISTORY — DX: Phlebitis and thrombophlebitis of unspecified site: I80.9

## 2017-07-17 LAB — CBC WITH DIFFERENTIAL/PLATELET
Basophils Absolute: 0 10*3/uL (ref 0.0–0.1)
Basophils Relative: 0 %
EOS ABS: 0.2 10*3/uL (ref 0.0–0.7)
EOS PCT: 2 %
HCT: 41.2 % (ref 39.0–52.0)
HEMOGLOBIN: 13.8 g/dL (ref 13.0–17.0)
LYMPHS ABS: 1.6 10*3/uL (ref 0.7–4.0)
Lymphocytes Relative: 18 %
MCH: 29 pg (ref 26.0–34.0)
MCHC: 33.5 g/dL (ref 30.0–36.0)
MCV: 86.6 fL (ref 78.0–100.0)
MONOS PCT: 11 %
Monocytes Absolute: 1 10*3/uL (ref 0.1–1.0)
Neutro Abs: 6 10*3/uL (ref 1.7–7.7)
Neutrophils Relative %: 69 %
PLATELETS: 178 10*3/uL (ref 150–400)
RBC: 4.76 MIL/uL (ref 4.22–5.81)
RDW: 12.9 % (ref 11.5–15.5)
WBC: 8.9 10*3/uL (ref 4.0–10.5)

## 2017-07-17 LAB — BASIC METABOLIC PANEL
Anion gap: 5 (ref 5–15)
BUN: 15 mg/dL (ref 6–20)
CALCIUM: 9.3 mg/dL (ref 8.9–10.3)
CHLORIDE: 102 mmol/L (ref 101–111)
CO2: 28 mmol/L (ref 22–32)
Creatinine, Ser: 1.22 mg/dL (ref 0.61–1.24)
GFR calc Af Amer: >60 mL/min (ref >60)
GLUCOSE: 107 mg/dL — AB (ref 65–99)
Potassium: 3.8 mmol/L (ref 3.5–5.1)
SODIUM: 135 mmol/L (ref 135–145)

## 2017-07-17 MED ORDER — IOPAMIDOL (ISOVUE-370) INJECTION 76%
100.0000 mL | Freq: Once | INTRAVENOUS | Status: AC | PRN
  Administered 2017-07-18: 100 mL via INTRAVENOUS

## 2017-07-17 NOTE — ED Triage Notes (Signed)
Cough 3 days. Pain in his right ribs and shoulder today that got worse after a flight from [place].

## 2017-07-17 NOTE — ED Notes (Signed)
ED Provider at bedside. 

## 2017-07-17 NOTE — ED Provider Notes (Addendum)
[facility] DEPT [facility] Provider Note: Georgena Spurling, MD, FACEP  CSN: [id number] MRN: [id number] ARRIVAL: 07/17/17 at 2117 ROOM: [place]  Shoulder Pain   HISTORY OF PRESENT ILLNESS  07/17/17 11:19 PM Jose [name] is a 46 y.o. male with several days of cold symptoms.  Specifically he has had a cough, nasal congestion and chills beginning 3 days ago.  The symptoms have significantly improved.  He recently traveled to [place] and flew back yesterday evening.  During the flight he developed right lower pleuritic chest pain radiating to his right shoulder.  The pain has gradually worsened and is now severe.  It is worse with deep breathing and less so with movement and he feels short of breath due to inability to take a deep breath without significant pain.  The pain is described as sharp.  He denies lower extremity swelling or pain.  He denies fever.  He denies nausea, vomiting or diarrhea.  He has a remote history of superficial thrombophlebitis and states a workup for hypercoagulability at the [facility] was unremarkable.   Past Medical History:  Diagnosis Date  . Crohn's disease ([place])   . Hyperlipidemia     History reviewed. No pertinent surgical history.  Family History  Problem Relation Age of Onset  . Heart disease Mother     Social History  Substance Use Topics  . Smoking status: Current Every Day Smoker    Packs/day: 0.50    Types: Cigarettes  . Smokeless tobacco: Never Used  . Alcohol use No    Prior to Admission medications   Medication Sig Start Date End Date Taking? Authorizing Provider  aspirin 81 MG tablet Take 162 mg by mouth daily.     Yes [provider]  colesevelam (WELCHOL) 625 MG tablet Take 2 tablets (1,250 mg total) by mouth 2 (two) times daily with a meal. 11/08/14  Yes Jerline Pain, MD  InFLIXimab (REMICADE IV) Inject into the vein every 8 (eight) weeks.     Yes [provider]  niacin  (NIASPAN) 1000 MG CR tablet TAKE 2 TABLETS BY MOUTH AT  BEDTIME 05/14/17  Yes Jerline Pain, MD    Allergies Statins   REVIEW OF SYSTEMS  Negative except as noted here or in the History of Present Illness.   PHYSICAL EXAMINATION  Initial Vital Signs Blood pressure 127/82, pulse 92, temperature 98.1 F (36.7 C), temperature source Oral, resp. rate 20, height 5\' 8"  (1.727 m), weight 88.5 kg (195 lb), SpO2 100 %.  Examination General: Well-developed, well-nourished male in no acute distress; appearance consistent with age of record HENT: normocephalic; atraumatic Eyes: pupils equal, round and reactive to light; extraocular muscles intact Neck: supple Heart: regular rate and rhythm Lungs: clear to auscultation bilaterally Chest: Mild right lower rib tenderness, poorly localized Abdomen: soft; nondistended; nontender; no masses or hepatosplenomegaly; bowel sounds present Extremities: No deformity; full range of motion; pulses normal; no edema; no calf tenderness Neurologic: Awake, alert and oriented; motor function intact in all extremities and symmetric; no facial droop Skin: Warm and dry Psychiatric: Normal mood and affect   RESULTS  Summary of this visit's results, reviewed by myself:   EKG Interpretation  Date/Time:    Ventricular Rate:    PR Interval:    QRS Duration:   QT Interval:    QTC Calculation:   R Axis:     Text Interpretation:        Laboratory Studies: Results for  orders placed or performed during the hospital encounter of 07/17/17 (from the past 24 hour(s))  CBC with Differential/Platelet     Status: None   Collection Time: 07/17/17 11:32 PM  Result Value Ref Range   WBC 8.9 4.0 - 10.5 K/uL   RBC 4.76 4.22 - 5.81 MIL/uL   Hemoglobin 13.8 13.0 - 17.0 g/dL   HCT 41.2 39.0 - 52.0 %   MCV 86.6 78.0 - 100.0 fL   MCH 29.0 26.0 - 34.0 pg   MCHC 33.5 30.0 - 36.0 g/dL   RDW 12.9 11.5 - 15.5 %   Platelets 178 150 - 400 K/uL   Neutrophils Relative % 69 %     Neutro Abs 6.0 1.7 - 7.7 K/uL   Lymphocytes Relative 18 %   Lymphs Abs 1.6 0.7 - 4.0 K/uL   Monocytes Relative 11 %   Monocytes Absolute 1.0 0.1 - 1.0 K/uL   Eosinophils Relative 2 %   Eosinophils Absolute 0.2 0.0 - 0.7 K/uL   Basophils Relative 0 %   Basophils Absolute 0.0 0.0 - 0.1 K/uL  Basic metabolic panel     Status: Abnormal   Collection Time: 07/17/17 11:32 PM  Result Value Ref Range   Sodium 135 135 - 145 mmol/L   Potassium 3.8 3.5 - 5.1 mmol/L   Chloride 102 101 - 111 mmol/L   CO2 28 22 - 32 mmol/L   Glucose, Bld 107 (H) 65 - 99 mg/dL   BUN 15 6 - 20 mg/dL   Creatinine, Ser 1.22 0.61 - 1.24 mg/dL   Calcium 9.3 8.9 - 10.3 mg/dL   GFR calc non Af Amer >60 >60 mL/min   GFR calc Af Amer >60 >60 mL/min   Anion gap 5 5 - 15   Imaging Studies: Dg Chest 2 View  Result Date: 07/17/2017 CLINICAL DATA:  46 year old male with cough. EXAM: CHEST  2 VIEW COMPARISON:  Chest CT dated 11/07/2016 under radiograph dated 08/04 04/18/2016 FINDINGS: Focal hazy density at the right lung base appears new compared to the prior radiograph and may represent an area of atelectatic changes although infiltrate is not entirely excluded. The left lung is clear. There is no pleural effusion or pneumothorax. The cardiac silhouette is within normal limits. No acute osseous pathology. IMPRESSION: Right lung base atelectasis versus developing infiltrate. Clinical correlation is recommended. Electronically Signed   By: Anner Crete M.D.   On: 07/17/2017 22:11   Ct Angio Chest Pe W And/or Wo Contrast  Result Date: 07/18/2017 CLINICAL DATA:  46 year old male with cough x3 days. Recent flight from [place]. EXAM: CT ANGIOGRAPHY CHEST WITH CONTRAST TECHNIQUE: Multidetector CT imaging of the chest was performed using the standard protocol during bolus administration of intravenous contrast. Multiplanar CT image reconstructions and MIPs were obtained to evaluate the vascular anatomy. CONTRAST:  100 cc Isovue  370 COMPARISON:  Chest radiograph dated 07/17/2017 FINDINGS: Cardiovascular: There is no cardiomegaly or pericardial effusion. The thoracic aorta is unremarkable. The origins of the great vessels of the aortic arch appear patent. Bilateral lobar pulmonary emboli with extension into the segmental and subsegmental branches. No definite evidence of right heart strain. The right ventricle measures approximately 4.7 cm in diameter and the left ventricle measures approximately 5 cm in diameter. Mediastinum/Nodes: There is no hilar or mediastinal adenopathy. Esophagus and the thyroid gland are grossly unremarkable. No mediastinal fluid collection. Lungs/Pleura: Minimal bibasilar atelectatic changes. A focal subpleural area of hazy density in the lateral basal segment of the right lower  lobe may represent an area of pulmonary infarct. Smaller sub pleural density in the left upper lobe may represent atelectatic changes. There is no pleural effusion or pneumothorax. The central airways are patent. Upper Abdomen: No acute abnormality. Musculoskeletal: No chest wall abnormality. No acute or significant osseous findings. Review of the MIP images confirms the above findings. IMPRESSION: Bilateral pulmonary artery emboli involving the lobar branches with extension into the segmental and subsegmental branches. No definite CT evidence of right cardiac straining. These results were called by telephone at the time of interpretation on 07/18/2017 at 12:41 am to Dr. Shanon Rosser , who verbally acknowledged these results. Electronically Signed   By: Anner Crete M.D.   On: 07/18/2017 00:42    ED COURSE  Nursing notes and initial vitals signs, including pulse oximetry, reviewed.  Vitals:   07/17/17 2123 07/17/17 2125 07/17/17 2135 07/17/17 2358  BP: 127/82   125/73  Pulse: 92   89  Resp: 20   16  Temp: 98.1 F (36.7 C)     TempSrc: Oral     SpO2: 100%   97%  Weight:  88.5 kg (195 lb) 88.5 kg (195 lb)   Height:  5\' 8"   (1.727 m) 5\' 8"  (1.727 m)    1:46 AM Dr. Blaine Hamper to admit to Hospitalist service at [facility]. Heparin ordered for anticoagulation.  PROCEDURES    ED DIAGNOSES     ICD-10-CM   1. Bilateral pulmonary embolism ([place]) I26.99        Anjanae Woehrle, MD 07/18/17 0049    Shanon Rosser, MD 07/18/17 [phone]

## 2017-07-18 ENCOUNTER — Other Ambulatory Visit: Payer: Self-pay

## 2017-07-18 ENCOUNTER — Observation Stay (HOSPITAL_BASED_OUTPATIENT_CLINIC_OR_DEPARTMENT_OTHER): Payer: 59

## 2017-07-18 ENCOUNTER — Encounter (HOSPITAL_COMMUNITY): Payer: Self-pay | Admitting: Internal Medicine

## 2017-07-18 DIAGNOSIS — I2699 Other pulmonary embolism without acute cor pulmonale: Secondary | ICD-10-CM

## 2017-07-18 DIAGNOSIS — R0789 Other chest pain: Secondary | ICD-10-CM | POA: Diagnosis not present

## 2017-07-18 DIAGNOSIS — K50919 Crohn's disease, unspecified, with unspecified complications: Secondary | ICD-10-CM

## 2017-07-18 DIAGNOSIS — I5030 Unspecified diastolic (congestive) heart failure: Secondary | ICD-10-CM

## 2017-07-18 DIAGNOSIS — E785 Hyperlipidemia, unspecified: Secondary | ICD-10-CM | POA: Diagnosis not present

## 2017-07-18 DIAGNOSIS — R05 Cough: Secondary | ICD-10-CM | POA: Diagnosis not present

## 2017-07-18 DIAGNOSIS — Z720 Tobacco use: Secondary | ICD-10-CM | POA: Diagnosis not present

## 2017-07-18 DIAGNOSIS — K5090 Crohn's disease, unspecified, without complications: Secondary | ICD-10-CM | POA: Diagnosis not present

## 2017-07-18 LAB — ECHOCARDIOGRAM COMPLETE
Height: 68 in
Weight: 3123.2 oz

## 2017-07-18 LAB — GLUCOSE, CAPILLARY: GLUCOSE-CAPILLARY: 130 mg/dL — AB (ref 65–99)

## 2017-07-18 LAB — HEPARIN LEVEL (UNFRACTIONATED): HEPARIN UNFRACTIONATED: 0.61 [IU]/mL (ref 0.30–0.70)

## 2017-07-18 LAB — HIV ANTIBODY (ROUTINE TESTING W REFLEX): HIV SCREEN 4TH GENERATION: NONREACTIVE

## 2017-07-18 LAB — BRAIN NATRIURETIC PEPTIDE: B Natriuretic Peptide: 10 pg/mL (ref 0.0–100.0)

## 2017-07-18 MED ORDER — HYDRALAZINE HCL 20 MG/ML IJ SOLN: 5.0000 mg | INTRAMUSCULAR | Status: DC | PRN

## 2017-07-18 MED ORDER — NICOTINE 21 MG/24HR TD PT24
21.0000 mg | MEDICATED_PATCH | Freq: Every day | TRANSDERMAL | Status: DC
  Filled 2017-07-18: qty 1

## 2017-07-18 MED ORDER — HEPARIN BOLUS VIA INFUSION
5000.0000 [IU] | Freq: Once | INTRAVENOUS | Status: AC
  Administered 2017-07-18: 5000 [IU] via INTRAVENOUS

## 2017-07-18 MED ORDER — RIVAROXABAN 20 MG PO TABS: 20.0000 mg | ORAL_TABLET | Freq: Every day | ORAL | Status: DC

## 2017-07-18 MED ORDER — ACETAMINOPHEN 325 MG PO TABS: 650.0000 mg | ORAL_TABLET | Freq: Four times a day (QID) | ORAL | Status: DC | PRN

## 2017-07-18 MED ORDER — ONDANSETRON HCL 4 MG/2ML IJ SOLN
4.0000 mg | Freq: Once | INTRAMUSCULAR | Status: AC
  Administered 2017-07-18: 4 mg via INTRAVENOUS
  Filled 2017-07-18: qty 2

## 2017-07-18 MED ORDER — ASPIRIN EC 81 MG PO TBEC
162.0000 mg | DELAYED_RELEASE_TABLET | Freq: Every day | ORAL | Status: DC
  Filled 2017-07-18: qty 2

## 2017-07-18 MED ORDER — ZOLPIDEM TARTRATE 5 MG PO TABS: 5.0000 mg | ORAL_TABLET | Freq: Every evening | ORAL | Status: DC | PRN

## 2017-07-18 MED ORDER — OXYCODONE-ACETAMINOPHEN 5-325 MG PO TABS
1.0000 | ORAL_TABLET | ORAL | Status: DC | PRN
  Administered 2017-07-18 (×2): 1 via ORAL
  Filled 2017-07-18 (×2): qty 1

## 2017-07-18 MED ORDER — RIVAROXABAN 15 MG PO TABS
15.0000 mg | ORAL_TABLET | Freq: Two times a day (BID) | ORAL | Status: DC
  Administered 2017-07-18: 15 mg via ORAL
  Filled 2017-07-18: qty 1

## 2017-07-18 MED ORDER — RIVAROXABAN (XARELTO) VTE STARTER PACK (15 & 20 MG)
ORAL_TABLET | ORAL | 0 refills | Status: DC
Start: 2017-07-18 — End: 2017-10-06

## 2017-07-18 MED ORDER — HYDROMORPHONE HCL 1 MG/ML IJ SOLN
1.0000 mg | Freq: Once | INTRAMUSCULAR | Status: AC
  Administered 2017-07-18: 1 mg via INTRAVENOUS
  Filled 2017-07-18: qty 1

## 2017-07-18 MED ORDER — HYDROMORPHONE HCL 1 MG/ML IJ SOLN
1.0000 mg | INTRAMUSCULAR | Status: DC | PRN
  Administered 2017-07-18 (×2): 1 mg via INTRAVENOUS
  Filled 2017-07-18 (×2): qty 1

## 2017-07-18 MED ORDER — ALBUTEROL SULFATE (2.5 MG/3ML) 0.083% IN NEBU: 2.5000 mg | INHALATION_SOLUTION | RESPIRATORY_TRACT | Status: DC | PRN

## 2017-07-18 MED ORDER — HYDROMORPHONE HCL 1 MG/ML IJ SOLN: 1.0000 mg | INTRAMUSCULAR | Status: DC | PRN

## 2017-07-18 MED ORDER — ONDANSETRON HCL 4 MG/2ML IJ SOLN: 4.0000 mg | Freq: Three times a day (TID) | INTRAMUSCULAR | Status: DC | PRN

## 2017-07-18 MED ORDER — HEPARIN (PORCINE) IN NACL 100-0.45 UNIT/ML-% IJ SOLN
1450.0000 [IU]/h | INTRAMUSCULAR | Status: DC
  Administered 2017-07-18: 1450 [IU]/h via INTRAVENOUS
  Filled 2017-07-18: qty 250

## 2017-07-18 MED ORDER — SODIUM CHLORIDE 0.9 % IV SOLN
INTRAVENOUS | Status: DC
  Administered 2017-07-18: 06:00:00 via INTRAVENOUS

## 2017-07-18 MED ORDER — DM-GUAIFENESIN ER 30-600 MG PO TB12: 1.0000 | ORAL_TABLET | Freq: Two times a day (BID) | ORAL | Status: DC | PRN

## 2017-07-18 MED ORDER — COLESEVELAM HCL 625 MG PO TABS
1250.0000 mg | ORAL_TABLET | Freq: Two times a day (BID) | ORAL | Status: DC
  Administered 2017-07-18: 1250 mg via ORAL
  Filled 2017-07-18 (×2): qty 2

## 2017-07-18 MED ORDER — ACETAMINOPHEN 650 MG RE SUPP: 650.0000 mg | Freq: Four times a day (QID) | RECTAL | Status: DC | PRN

## 2017-07-18 MED ORDER — NIACIN ER (ANTIHYPERLIPIDEMIC) 500 MG PO TBCR
2000.0000 mg | EXTENDED_RELEASE_TABLET | Freq: Every day | ORAL | Status: DC
Start: 2017-07-18 — End: 2017-07-18
  Filled 2017-07-18: qty 4

## 2017-07-18 NOTE — Care Management Note (Signed)
Case Management Note  Patient Details  Name: ZYAD BOOMER MRN: [id number] Date of Birth: [birth year]  Subjective/Objective:                 Spoke w patient at the bedside. Reviewed use of Xaralto cards, has 30 day free card and 1 year copay card. No other CM needs identified. CM signing off   Action/Plan:   Expected Discharge Date:  07/18/17               Expected Discharge Plan:  Home/Self Care  In-House Referral:     Discharge planning Services  CM Consult, Medication Assistance  Post Acute Care Choice:    Choice offered to:     DME Arranged:    DME Agency:     HH Arranged:    [place] Agency:     Status of Service:  Completed, signed off  If discussed at [employer] of Stay Meetings, dates discussed:    Additional Comments:  Carles Collet, RN 07/18/2017, 1:52 PM

## 2017-07-18 NOTE — Progress Notes (Signed)
ANTICOAGULATION CONSULT NOTE - Initial Consult  Pharmacy Consult for Xarelto Indication: pulmonary embolus  Allergies  Allergen Reactions  . Statins Other (See Comments)    Chest pain    Patient Measurements: Height: 5\' 8"  (172.7 cm) Weight: 195 lb 3.2 oz (88.5 kg) (scale a) IBW/kg (Calculated) : 68.4  Vital Signs: Temp: 98.4 F (36.9 C) (11/03 0511) Temp Source: Oral (11/03 0511) BP: 133/72 (11/03 0511) Pulse Rate: 87 (11/03 0511)  Labs:  Recent Labs  07/17/17 2332 07/18/17 1105  HGB 13.8  --   HCT 41.2  --   PLT 178  --   HEPARINUNFRC  --  0.61  CREATININE 1.22  --     Estimated Creatinine Clearance: 80 mL/min (by C-G formula based on SCr of 1.22 mg/dL).   Medical History: Past Medical History:  Diagnosis Date  . Crohn's disease ([place])   . Hyperlipidemia   . Thrombophlebitis    Assessment: Anticoag: Presented w/ shoulder pain after flight from [place]. Pharmacy consulted to dose heparin for PE. Bilateral PE confirmed via CT, with no evidence of heart strain. HL 0.61 in goal. Change to Xarelto for discharge. CBC stable. Extensive conversation and education with patient to make decision on DOAC. - Evidence of acute deep vein thrombosis involving the popliteal, gastrocnemius, and soleal veins of the right lower extremity.  Goal of Therapy:  Therapeutic oral anticoagulation  Plan:  D/c heparin Xarelto 15mg  BID x 21d, then 20mg  daily   Jose [name] S. Jose [name], PharmD, BCPS Clinical Staff Pharmacist Pager [phone]  Jose [name] 07/18/2017,1:10 PM

## 2017-07-18 NOTE — Progress Notes (Signed)
Benefit check cannot be performed on weekends, insurance policy reviewers unable, offices closed. Private insurance will accept 30 day free card and copay reduction cards from both [place] and [facility].

## 2017-07-18 NOTE — Progress Notes (Signed)
ANTICOAGULATION CONSULT NOTE - Initial Consult  Pharmacy Consult for heparin  Indication: pulmonary embolus  Allergies  Allergen Reactions  . Statins Other (See Comments)    Chest pain    Patient Measurements: Height: 5' 8"  (172.7 cm) Weight: 195 lb (88.5 kg) IBW/kg (Calculated) : 68.4 Heparin Dosing Weight: 86.4 kg   Vital Signs: Temp: 98.1 F (36.7 C) (11/02 2123) Temp Source: Oral (11/02 2123) BP: 125/73 (11/02 2358) Pulse Rate: 89 (11/02 2358)  Labs:  Recent Labs  07/17/17 2332  HGB 13.8  HCT 41.2  PLT 178  CREATININE 1.22    Estimated Creatinine Clearance: 80 mL/min (by C-G formula based on SCr of 1.22 mg/dL).  Medical History: Past Medical History:  Diagnosis Date  . Crohn's disease ([place])   . Hyperlipidemia    Assessment: 46 yo male presents to [facility] with shoulder pain after flight from [place]. Pharmacy consulted to dose heparin for PE. Bilateral PE confirmed via CT, with no evidence of heart strain.   No oral anticoagulation PTA per RN at bedside. Hgb and plts normal with no s/s bleeding noted per RN.   Goal of Therapy:  Heparin level 0.3-0.7 units/ml Monitor platelets by anticoagulation protocol: Yes   Plan:  Heparin bolus 5000 units IV x1 Start heparin infusion at 1450 units/hr  Heparin level in 6 hrs Daily heparin level and CBC Monitor for s/s bleeding  Lavonda Jumbo, PharmD Clinical Pharmacist 07/18/17 1:41 AM

## 2017-07-18 NOTE — ED Notes (Signed)
ED Provider at bedside. 

## 2017-07-18 NOTE — Discharge Instructions (Addendum)
Information on my medicine - XARELTO (rivaroxaban)  This medication education was reviewed with me or my healthcare representative as part of my discharge preparation.  The pharmacist that spoke with me during my hospital stay was:  Wayland Salinas, [place]? Xarelto was prescribed to treat blood clots that may have been found in the veins of your legs (deep vein thrombosis) or in your lungs (pulmonary embolism) and to reduce the risk of them occurring again.  What do you need to know about Xarelto? The starting dose is one 15 mg tablet taken TWICE daily with food for the FIRST 21 DAYS then on (enter date)  08/08/2017  the dose is changed to one 20 mg tablet taken ONCE A DAY with your evening meal.  DO NOT stop taking Xarelto without talking to the health care provider who prescribed the medication.  Refill your prescription for 20 mg tablets before you run out.  After discharge, you should have regular check-up appointments with your healthcare provider that is prescribing your Xarelto.  In the future your dose may need to be changed if your kidney function changes by a significant amount.  What do you do if you miss a dose? If you are taking Xarelto TWICE DAILY and you miss a dose, take it as soon as you remember. You may take two 15 mg tablets (total 30 mg) at the same time then resume your regularly scheduled 15 mg twice daily the next day.  If you are taking Xarelto ONCE DAILY and you miss a dose, take it as soon as you remember on the same day then continue your regularly scheduled once daily regimen the next day. Do not take two doses of Xarelto at the same time.   Important Safety Information Xarelto is a blood thinner medicine that can cause bleeding. You should call your healthcare provider right away if you experience any of the following: ? Bleeding from an injury or your nose that does not stop. ? Unusual colored urine (red or  dark brown) or unusual colored stools (red or black). ? Unusual bruising for unknown reasons. ? A serious fall or if you hit your head (even if there is no bleeding).  Some medicines may interact with Xarelto and might increase your risk of bleeding while on Xarelto. To help avoid this, consult your healthcare provider or pharmacist prior to using any new prescription or non-prescription medications, including herbals, vitamins, non-steroidal anti-inflammatory drugs (NSAIDs) and supplements.  This website has more information on Xarelto: [email or web address]  Additional discharge instructions  Please get your medications reviewed and adjusted by your Primary MD.  Please request your Primary MD to go over all Hospital Tests and Procedure/Radiological results at the follow up, please get all Hospital records sent to your Prim MD by signing hospital release before you go home.  If you had Pneumonia of Lung problems at the Hospital: Please get a 2 view Chest X ray done in 6-8 weeks after hospital discharge or sooner if instructed by your Primary MD.  If you have Congestive Heart Failure: Please call your Cardiologist or Primary MD anytime you have any of the following symptoms:  1) 3 pound weight gain in 24 hours or 5 pounds in 1 week  2) shortness of breath, with or without a dry hacking cough  3) swelling in the hands, feet or stomach  4) if you have to sleep on extra pillows at night in order to breathe  Follow cardiac low salt diet and 1.5 lit/day fluid restriction.  If you have diabetes Accuchecks 4 times/day, Once in AM empty stomach and then before each meal. Log in all results and show them to your primary doctor at your next visit. If any glucose reading is under 80 or above 300 call your primary MD immediately.  If you have Seizure/Convulsions/Epilepsy: Please do not drive, operate heavy machinery, participate in activities at heights or participate in high speed sports until you  have seen by Primary MD or a Neurologist and advised to do so again.  If you had Gastrointestinal Bleeding: Please ask your Primary MD to check a complete blood count within one week of discharge or at your next visit. Your endoscopic/colonoscopic biopsies that are pending at the time of discharge, will also need to followed by your Primary MD.  Get Medicines reviewed and adjusted. Please take all your medications with you for your next visit with your Primary MD  Please request your Primary MD to go over all hospital tests and procedure/radiological results at the follow up, please ask your Primary MD to get all Hospital records sent to his/her office.  If you experience worsening of your admission symptoms, develop shortness of breath, life threatening emergency, suicidal or homicidal thoughts you must seek medical attention immediately by calling 911 or calling your MD immediately  if symptoms less severe.  You must read complete instructions/literature along with all the possible adverse reactions/side effects for all the Medicines you take and that have been prescribed to you. Take any new Medicines after you have completely understood and accpet all the possible adverse reactions/side effects.   Do not drive or operate heavy machinery when taking Pain medications.   Do not take more than prescribed Pain, Sleep and Anxiety Medications  Special Instructions: If you have smoked or chewed Tobacco  in the last 2 yrs please stop smoking, stop any regular Alcohol  and or any Recreational drug use.  Wear Seat belts while driving.  Please note You were cared for by a hospitalist during your hospital stay. If you have any questions about your discharge medications or the care you received while you were in the hospital after you are discharged, you can call the unit and asked to speak with the hospitalist on call if the hospitalist that took care of you is not available. Once you are discharged,  your primary care physician will handle any further medical issues. Please note that NO REFILLS for any discharge medications will be authorized once you are discharged, as it is imperative that you return to your primary care physician (or establish a relationship with a primary care physician if you do not have one) for your aftercare needs so that they can reassess your need for medications and monitor your lab values.  You can reach the hospitalist office at phone [phone] or fax [phone]   If you do not have a primary care physician, you can call [phone] for a physician referral.

## 2017-07-18 NOTE — H&P (Signed)
History and Physical    Jose [name] TFT:[id number] DOB: 11/05/1970 DOA: 07/17/2017  Referring MD/NP/PA:   PCP: Velna Ochs, MD   Patient coming from:  The patient is coming from home.  At baseline, pt is independent for most of ADL.    Chief Complaint: chest pain  HPI: Jose [name] is a 46 y.o. male with medical history significant of HLD, Crohn's disease, tobacco abuse, superficial thrombophlebitis, who presents with right lower chest pain.  Pt states that he has several days of cold symptoms, including cough, nasal congestion and chills. Symptoms have significantly improved. He recently traveled to [place] and flew back Thursday. He states that he developed severe pain in his left lower lateral chest. It is constant, sharp, 10 out of 10 in severity, radiating to the right shoulder, pleuritic, aggravated by deep breath. It is associated with mild SOB. Hhe has mild dry cough, no fever or chills. Patient states that he had right calf pain 2 weeks ago, which has resolved. Patient states that he has frequent long distant traveling for business, including to [place]. He had history of left leg superficial thrombophlebitis twice. He was tested in cancer center for hypercoagulable test, which was negative. Patient states that his Crohn's disease is well controlled. Currently no nausea, vomiting, diarrhea or abdominal pain. Denies symptoms of UTI or unilateral weakness.  ED Course: pt was found to have WBC 8.9, creatinine 1.22, temperature normal, no tachycardia, O2 sat 97% on room air. CTA showed bilateral pulmonary artery emboli involving the lobar branches with extension into the segmental and subsegmental branches, no definite CT evidence of right cardiac straining. Patient is placed on telemetry bed for observation. IV heparin was started.  Review of Systems:   General: no fevers, chills, no body weight gain, has fatigue HEENT: no blurry vision, hearing changes or sore  throat Respiratory: has dyspnea, coughing, no heezing CV: has chest pain, no palpitations GI: no nausea, vomiting, abdominal pain, diarrhea, constipation GU: no dysuria, burning on urination, increased urinary frequency, hematuria  Ext: no leg edema Neuro: no unilateral weakness, numbness, or tingling, no vision change or hearing loss Skin: no rash, no skin tear. MSK: No muscle spasm, no deformity, no limitation of range of movement in spin Heme: No easy bruising.  Travel history: No recent long distant travel.  Allergy:  Allergies  Allergen Reactions  . Statins Other (See Comments)    Chest pain    Past Medical History:  Diagnosis Date  . Crohn's disease ([place])   . Hyperlipidemia   . Thrombophlebitis     History reviewed. No pertinent surgical history.  Social History:  reports that he has been smoking Cigarettes.  He has been smoking about 0.50 packs per day. He has never used smokeless tobacco. He reports that he does not drink alcohol or use drugs.  Family History:  Family History  Problem Relation Age of Onset  . Heart disease Mother      Prior to Admission medications   Medication Sig Start Date End Date Taking? Authorizing Provider  aspirin 81 MG tablet Take 162 mg by mouth daily.     Yes [provider]  colesevelam (WELCHOL) 625 MG tablet Take 2 tablets (1,250 mg total) by mouth 2 (two) times daily with a meal. 11/08/14  Yes Jerline Pain, MD  InFLIXimab (REMICADE IV) Inject into the vein every 8 (eight) weeks.     Yes [provider]  niacin (NIASPAN) 1000 MG CR tablet TAKE 2 TABLETS  BY MOUTH AT  BEDTIME 05/14/17  Yes Jerline Pain, MD    Physical Exam: Vitals:   07/18/17 0300 07/18/17 0330 07/18/17 0400 07/18/17 0511  BP: 133/75 133/80 131/74 133/72  Pulse: 94 100 97 87  Resp: 14 13 16 18   Temp:    98.4 F (36.9 C)  TempSrc:    Oral  SpO2: 93% 92% 95% 98%  Weight:    88.5 kg (195 lb 3.2 oz)  Height:    5' 8"  (1.727 m)   General: Not  in acute distress HEENT:       Eyes: PERRL, EOMI, no scleral icterus.       ENT: No discharge from the ears and nose, no pharynx injection, no tonsillar enlargement.        Neck: No JVD, no bruit, no mass felt. Heme: No neck lymph node enlargement. Cardiac: S1/S2, RRR, No murmurs, No gallops or rubs. Respiratory: No rales, wheezing, rhonchi or rubs. GI: Soft, nondistended, nontender, no rebound pain, no organomegaly, BS present. GU: No hematuria Ext: No pitting leg edema bilaterally. 2+DP/PT pulse bilaterally. Musculoskeletal: No joint deformities, No joint redness or warmth, no limitation of ROM in spin. Skin: No rashes.  Neuro: Alert, oriented X3, cranial nerves II-XII grossly intact, moves all extremities normally. Psych: Patient is not psychotic, no suicidal or hemocidal ideation.  Labs on Admission: I have personally reviewed following labs and imaging studies  CBC:  Recent Labs Lab 07/17/17 2332  WBC 8.9  NEUTROABS 6.0  HGB 13.8  HCT 41.2  MCV 86.6  PLT 017   Basic Metabolic Panel:  Recent Labs Lab 07/17/17 2332  NA 135  K 3.8  CL 102  CO2 28  GLUCOSE 107*  BUN 15  CREATININE 1.22  CALCIUM 9.3   GFR: Estimated Creatinine Clearance: 80 mL/min (by C-G formula based on SCr of 1.22 mg/dL). Liver Function Tests: No results for input(s): AST, ALT, ALKPHOS, BILITOT, PROT, ALBUMIN in the last 168 hours. No results for input(s): LIPASE, AMYLASE in the last 168 hours. No results for input(s): AMMONIA in the last 168 hours. Coagulation Profile: No results for input(s): INR, PROTIME in the last 168 hours. Cardiac Enzymes: No results for input(s): CKTOTAL, CKMB, CKMBINDEX, TROPONINI in the last 168 hours. BNP (last 3 results) No results for input(s): PROBNP in the last 8760 hours. HbA1C: No results for input(s): HGBA1C in the last 72 hours. CBG: No results for input(s): GLUCAP in the last 168 hours. Lipid Profile: No results for input(s): CHOL, HDL, LDLCALC,  TRIG, CHOLHDL, LDLDIRECT in the last 72 hours. Thyroid Function Tests: No results for input(s): TSH, T4TOTAL, FREET4, T3FREE, THYROIDAB in the last 72 hours. Anemia Panel: No results for input(s): VITAMINB12, FOLATE, FERRITIN, TIBC, IRON, RETICCTPCT in the last 72 hours. Urine analysis:    Component Value Date/Time   COLORURINE YELLOW 02/12/2007 2044   APPEARANCEUR CLEAR 02/12/2007 2044   LABSPEC 1.023 02/12/2007 2044   PHURINE 5.5 02/12/2007 2044   GLUCOSEU NEG mg/dL 02/12/2007 2044   BILIRUBINUR NEG 02/12/2007 2044   KETONESUR NEG mg/dL 02/12/2007 2044   PROTEINUR NEG mg/dL 02/12/2007 2044   UROBILINOGEN 0.2 02/12/2007 2044   NITRITE NEG 02/12/2007 2044   LEUKOCYTESUR NEG 02/12/2007 2044   Sepsis Labs: @LABRCNTIP (procalcitonin:4,lacticidven:4) )No results found for this or any previous visit (from the past 240 hour(s)).   Radiological Exams on Admission: Dg Chest 2 View  Result Date: 07/17/2017 CLINICAL DATA:  46 year old male with cough. EXAM: CHEST  2 VIEW COMPARISON:  Chest CT dated 11/07/2016 under radiograph dated 08/04 04/18/2016 FINDINGS: Focal hazy density at the right lung base appears new compared to the prior radiograph and may represent an area of atelectatic changes although infiltrate is not entirely excluded. The left lung is clear. There is no pleural effusion or pneumothorax. The cardiac silhouette is within normal limits. No acute osseous pathology. IMPRESSION: Right lung base atelectasis versus developing infiltrate. Clinical correlation is recommended. Electronically Signed   By: Anner Crete M.D.   On: 07/17/2017 22:11   Ct Angio Chest Pe W And/or Wo Contrast  Result Date: 07/18/2017 CLINICAL DATA:  46 year old male with cough x3 days. Recent flight from [place]. EXAM: CT ANGIOGRAPHY CHEST WITH CONTRAST TECHNIQUE: Multidetector CT imaging of the chest was performed using the standard protocol during bolus administration of intravenous contrast. Multiplanar  CT image reconstructions and MIPs were obtained to evaluate the vascular anatomy. CONTRAST:  100 cc Isovue 370 COMPARISON:  Chest radiograph dated 07/17/2017 FINDINGS: Cardiovascular: There is no cardiomegaly or pericardial effusion. The thoracic aorta is unremarkable. The origins of the great vessels of the aortic arch appear patent. Bilateral lobar pulmonary emboli with extension into the segmental and subsegmental branches. No definite evidence of right heart strain. The right ventricle measures approximately 4.7 cm in diameter and the left ventricle measures approximately 5 cm in diameter. Mediastinum/Nodes: There is no hilar or mediastinal adenopathy. Esophagus and the thyroid gland are grossly unremarkable. No mediastinal fluid collection. Lungs/Pleura: Minimal bibasilar atelectatic changes. A focal subpleural area of hazy density in the lateral basal segment of the right lower lobe may represent an area of pulmonary infarct. Smaller sub pleural density in the left upper lobe may represent atelectatic changes. There is no pleural effusion or pneumothorax. The central airways are patent. Upper Abdomen: No acute abnormality. Musculoskeletal: No chest wall abnormality. No acute or significant osseous findings. Review of the MIP images confirms the above findings. IMPRESSION: Bilateral pulmonary artery emboli involving the lobar branches with extension into the segmental and subsegmental branches. No definite CT evidence of right cardiac straining. These results were called by telephone at the time of interpretation on 07/18/2017 at 12:41 am to Dr. Shanon Rosser , who verbally acknowledged these results. Electronically Signed   By: Anner Crete M.D.   On: 07/18/2017 00:42     EKG:  Not done in ED, will get one.   Assessment/Plan Principal Problem:   PE (pulmonary thromboembolism) ([place]) Active Problems:   Crohn's disease ([facility])   Tobacco use   PE (pulmonary thromboembolism) ([place]): CTA showed bilateral  pulmonary artery emboli involving the lobar branches with extension into the segmental and subsegmental branches, no definite CT evidence of right cardiac straining. Patient does not have oxygen desaturation. Currently hemodynamically stable. This is likely provoked by frequent long distant traveling. Patient had negative workup for hypercoagulable study in the past.  -Place on tele bed for obs. -heparin drip initiated -2D echocardiogram ordered -LE dopplers ordered to evaluate for DVT -repeat EKG in a.m. -check BNP -pain control: When necessary Percocet and dilaurdid -prn albuterol for SOB and mucinex for cough  Crohn's disease ([place]): Patient is getting Remicade injection q8week, last dose was on Friday. Symptoms are well controlled. -Continue Welchol -Remicate injection as regular schecule  Tobacco abuse: -Did counseling about importance of quitting smoking -Nicotine patch   DVT ppx: on IV Heparin    Code Status: Full code Family Communication: None at bed side. Disposition Plan:  Anticipate discharge back to previous home environment Consults  called:  none Admission status: Obs / tele    Date of Service 07/18/2017    Ivor Costa [facility] Pager [phone]  If 7PM-7AM, please contact night-coverage [email or web address] Password [email or web address] 07/18/2017, 5:50 AM

## 2017-07-18 NOTE — Discharge Summary (Signed)
Physician Discharge Summary  Jose [name] EZM:[id number] DOB: [birth year]  PCP: Jose Ochs, MD  Admit date: 07/17/2017 Discharge date: 07/18/2017  Recommendations for Outpatient Follow-up:  1. Dr. Velna [name], PCP in 4 days with repeat labs (CBC & BMP). 2. Dr Jose [name], Hematology  Home Health: None Equipment/Devices: None    Discharge Condition: Improved and stable  CODE STATUS: Full  Diet recommendation: Heart healthy diet.  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 Alen Blew), hypercoagulable panel August 2017 showed no evidence of inherited or acquired thrombophilia and etiology of his superficial thrombophlebitis remained unclear, CT abdomen and pelvis to rule out occult malignancy were recommended which patient declined at that time, hyperlipidemia, Crohn's disease, tobacco abuse, presented to ED with subacute onset of right lateral and posterior chest pain. He reported pain behind his right calf approximately a month ago which resolved spontaneously. A week ago he had some URI symptoms of cold, cough, nasal congestion and chills which improved. 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 On day of admission, he developed severe pain on the right lateral chest and back, sharp, felt like somebody was stabbing him, aggravated by deep inspirations and unable to take deep breath. He denied cough, fever or chills. In the ED, not hypoxic, CTA chest confirmed acute pulmonary embolism without evidence of right heart strain. He was admitted for further evaluation and management.  Assessment and plan:  1. Acute pulmonary embolism: CTA chest confirmed bilateral pulmonary  artery emboli involving the lobar branches with extension into the segmental and subsegmental branches. No definite CT evidence of right cardiac straining. Bilateral lower extremity venous Dopplers: Evidence of acute DVT involving the popliteal, gastrocnemius and soleal veins of the right lower extremity. 2-D echo: LVEF 65-70 percent, grade 1 diastolic dysfunction and no right heart strain. EKG today showed sinus rhythm at 79 bpm, normal axis, some intraventricular conduction defect in leads V1 and V2, QTC 419 ms and no acute findings. He was briefly placed on IV heparin drip. I and the pharmacist extensively discussed with patient and his son at bedside regarding options for oral anticoagulation including warfarin versus DOAC's versus Pradaxa, risks and benefits. Patient opted Xarelto. He received his first dose in the hospital. Case management provided patient with a 30 day discount card for same. Patient was advised to follow closely with his PCP or hematologist regarding need for further evaluation and refill prescriptions/change of medications if his insurance doesn't cover (case management unable to verify on weekend). He was also advised extensively to not interrupt medications without medical advice and he verbalized understanding. Patient was taking aspirin to reduce side effects of niacin. Aspirin discontinued to reduce risk of bleeding while on Xarelto. He was advised to follow-up with his cardiologist regarding alternate options for management of his hyperlipidemia. He remained hemodynamically stable. Chest pain significantly improved. He was not hypoxic. His pulmonary embolism was likely provoked by frequent long-distance travels however given prior history of recurrent superficial thrombophlebitis, he may need further evaluation. Duration of anticoagulation is at least 6 months but this has to be reconsidered during outpatient follow-up by his PCP/hematologist. All of this was extensively discussed  with patient and his adult son at bedside and  they verbalized understanding. 2. Pleuritic chest pain: Likely related to problem #1. Improved. Advised acetaminophen. Advised to avoid NSAIDs and to minimize bleeding risks. 3. Crohn's disease: Stable. Continue Remicade. 4. Hyperlipidemia: Continue WelChol. Niacin discontinued since patient taken off aspirin to avoid bleeding risks while on Xarelto. Recommended outpatient follow-up with his cardiologist and he verbalized understanding. 5. Tobacco abuse: Cessation counseled. Patient declines nicotine patch at discharge. 6. Stage II chronic kidney disease: Stable. Outpatient follow-up. HIV screen nonreactive.   Consultations:  None  Procedures:  As above   Discharge Instructions  Discharge Instructions    Call MD for:  difficulty breathing, headache or visual disturbances    Complete by:  As directed    Call MD for:  extreme fatigue    Complete by:  As directed    Call MD for:  persistant dizziness or light-headedness    Complete by:  As directed    Call MD for:  severe uncontrolled pain    Complete by:  As directed    Diet - low sodium heart healthy    Complete by:  As directed    Increase activity slowly    Complete by:  As directed        Medication List    STOP taking these medications   aspirin 81 MG tablet   niacin 1000 MG CR tablet Commonly known as:  NIASPAN     TAKE these medications   acetaminophen 325 MG tablet Commonly known as:  TYLENOL Take 2 tablets (650 mg total) by mouth every 6 (six) hours as needed for mild pain or moderate pain (or Fever >/= 101).   colesevelam 625 MG tablet Commonly known as:  WELCHOL Take 2 tablets (1,250 mg total) by mouth 2 (two) times daily with a meal.   fluticasone 50 MCG/ACT nasal spray Commonly known as:  FLONASE Place 1 spray into the nose daily as needed for congestion.   omeprazole 20 MG capsule Commonly known as:  PRILOSEC Take 20 mg by mouth daily as needed for  heartburn.   REMICADE IV Inject into the vein every 8 (eight) weeks.   Rivaroxaban 15 & 20 MG Tbpk Take as directed on package: Start with one 53m tablet by mouth twice a day with food. On Day 22, switch to one 254mtablet once a day with food.  Pharmacy: Please discard the first tablet from the pack which he has received in the hospital.      Follow-up Information    RoVelna OchsMD. Schedule an appointment as soon as possible for a visit in 4 day(s).   Specialty:  Internal Medicine Why:  Or alternate physician in that practice. To be seen with repeat labs (CBC & BMP). Contact information: [address] [address] [phone]        ShWyatt PortelaMD. Schedule an appointment as soon as possible for a visit.   Specialty:  Oncology Contact information: [address] [place] [phone]        Allergies  Allergen Reactions  . Statins Other (See Comments)    Chest pain      Procedures/Studies: Dg Chest 2 View  Result Date: 07/17/2017 CLINICAL DATA:  [age]ear old male with cough. EXAM: CHEST  2 VIEW COMPARISON:  Chest CT dated 11/07/2016 under radiograph dated 08/04 04/18/2016 FINDINGS: Focal hazy density at the right lung base appears new compared to the prior radiograph and may represent an area of atelectatic changes  although infiltrate is not entirely excluded. The left lung is clear. There is no pleural effusion or pneumothorax. The cardiac silhouette is within normal limits. No acute osseous pathology. IMPRESSION: Right lung base atelectasis versus developing infiltrate. Clinical correlation is recommended. Electronically Signed   By: Anner Crete M.D.   On: 07/17/2017 22:11   Ct Angio Chest Pe W And/or Wo Contrast  Result Date: 07/18/2017 CLINICAL DATA:  46 year old male with cough x3 days. Recent flight from [place]. EXAM: CT ANGIOGRAPHY CHEST WITH CONTRAST TECHNIQUE: Multidetector CT imaging of the  chest was performed using the standard protocol during bolus administration of intravenous contrast. Multiplanar CT image reconstructions and MIPs were obtained to evaluate the vascular anatomy. CONTRAST:  100 cc Isovue 370 COMPARISON:  Chest radiograph dated 07/17/2017 FINDINGS: Cardiovascular: There is no cardiomegaly or pericardial effusion. The thoracic aorta is unremarkable. The origins of the great vessels of the aortic arch appear patent. Bilateral lobar pulmonary emboli with extension into the segmental and subsegmental branches. No definite evidence of right heart strain. The right ventricle measures approximately 4.7 cm in diameter and the left ventricle measures approximately 5 cm in diameter. Mediastinum/Nodes: There is no hilar or mediastinal adenopathy. Esophagus and the thyroid gland are grossly unremarkable. No mediastinal fluid collection. Lungs/Pleura: Minimal bibasilar atelectatic changes. A focal subpleural area of hazy density in the lateral basal segment of the right lower lobe may represent an area of pulmonary infarct. Smaller sub pleural density in the left upper lobe may represent atelectatic changes. There is no pleural effusion or pneumothorax. The central airways are patent. Upper Abdomen: No acute abnormality. Musculoskeletal: No chest wall abnormality. No acute or significant osseous findings. Review of the MIP images confirms the above findings. IMPRESSION: Bilateral pulmonary artery emboli involving the lobar branches with extension into the segmental and subsegmental branches. No definite CT evidence of right cardiac straining. These results were called by telephone at the time of interpretation on 07/18/2017 at 12:41 am to Dr. Shanon Rosser , who verbally acknowledged these results. Electronically Signed   By: Anner Crete M.D.   On: 07/18/2017 00:42      Subjective: Patient states that he feels much better today. Significantly improved right lateral side chest wall pain.  Able to take deeper breaths. No dyspnea, cough, fever or chills reported. No renal in his legs or swelling.  Discharge Exam:  Vitals:   07/18/17 0330 07/18/17 0400 07/18/17 0511 07/18/17 1200  BP: 133/80 131/74 133/72 120/61  Pulse: 100 97 87 84  Resp: 13 16 18 18   Temp:   98.4 F (36.9 C) 98.1 F (36.7 C)  TempSrc:   Oral Oral  SpO2: 92% 95% 98% 97%  Weight:   88.5 kg (195 lb 3.2 oz)   Height:   5' 8"  (1.727 m)     General: Young male, moderately built and nourished, sitting up comfortably in bed this morning. Cardiovascular: S1 & S2 heard, RRR, S1/S2 +. No murmurs, rubs, gallops or clicks. No JVD or pedal edema. Telemetry: Sinus rhythm. Respiratory: Clear to auscultation without wheezing, rhonchi or crackles. No increased work of breathing. No pleural rub appreciated. Abdominal:  Non distended, non tender & soft. No organomegaly or masses appreciated. Normal bowel sounds heard. CNS: Alert and oriented. No focal deficits. Extremities: no edema, no cyanosis    The results of significant diagnostics from this hospitalization (including imaging, microbiology, ancillary and laboratory) are listed below for reference.      Labs: CBC:  Recent Labs Lab 07/17/17  2332  WBC 8.9  NEUTROABS 6.0  HGB 13.8  HCT 41.2  MCV 86.6  PLT 815   Basic Metabolic Panel:  Recent Labs Lab 07/17/17 2332  NA 135  K 3.8  CL 102  CO2 28  GLUCOSE 107*  BUN 15  CREATININE 1.22  CALCIUM 9.3   BNP (last 3 results)  Recent Labs  07/18/17 0534  BNP 10.0   CBG:  Recent Labs Lab 07/18/17 0648  GLUCAP 130*   Discussed in detail with patient's son at bedside. Updated care and answered questions.    Time coordinating discharge: Over 30 minutes  SIGNED:  Vernell Leep, MD, FACP, [place]. [facility] Pager [phone]  If 7PM-7AM, please contact night-coverage [email or web address] Password [email or web address] 07/18/2017, 4:01 PM

## 2017-07-18 NOTE — Progress Notes (Signed)
Patient given discharge instructions and all questions answered.  

## 2017-07-18 NOTE — Progress Notes (Signed)
Patient ambulated in the hallway on room air.  Oxygen saturation 95-98% while walking on room air.

## 2017-07-18 NOTE — ED Notes (Signed)
[facility] called and informed they would not have a truck available for about 2 hours. Pt made aware of reason for delay.

## 2017-07-18 NOTE — Progress Notes (Addendum)
This is a no charge note  Transfer from [facility] per Dr. Florina Ou  46 year old male with a past medical history of HLD, Crohn's disease, tobacco abuse, superficial thrombophlebitis, who presents with right lower chest pain.  Pt has several days of cold symptoms, including cough, nasal congestion and chills. Symptoms have significantly improved. He recently traveled to [place] and flew back yesterday evening.  During the flight he developed right lower chest pain radiating to his right shoulder. The pain is constant, sharp, pleuritic, aggravated by deep breath. He denies lower extremity swelling or pain. Of note, he has a remote history of superficial thrombophlebitis and states workup for hypercoagulability at the [place] center was unremarkable.  CTA showed bilateral pulmonary artery emboli involving the lobar branches with extension into the segmental and subsegmental branches. No definite CT evidence of right cardiac straining.  Patient was found to have WBC 8.9, creatinine 1.22, temperature normal, no tachycardia, O2 sat 97% on room air. Patient is placed on telemetry bed for observation. IV heparin was started.   Please call manager of [facility] at [phone] when pt arrives to floor   Ivor Costa, MD  [facility] Pager [phone]  If 7PM-7AM, please contact night-coverage [email or web address] Password [email or web address] 07/18/2017, 1:40 AM

## 2017-07-18 NOTE — Progress Notes (Signed)
  Echocardiogram 2D Echocardiogram has been performed.  Jennette Dubin 07/18/2017, 10:50 AM

## 2017-07-18 NOTE — Progress Notes (Signed)
Bilateral lower extremity venous duplex has been completed. Evidence of acute deep vein thrombosis involving the popliteal, gastrocnemius, and soleal veins of the right lower extremity. Results were given to the patient's nurse, Lauren.  07/18/17 10:46 AM Carlos Levering RVT

## 2017-07-20 ENCOUNTER — Telehealth: Payer: Self-pay | Admitting: *Deleted

## 2017-07-20 ENCOUNTER — Telehealth: Payer: Self-pay | Admitting: Oncology

## 2017-07-20 NOTE — Telephone Encounter (Signed)
Scheduled appt per 11/5 sch msg. Spoke with patient regarding these appointments.

## 2017-07-20 NOTE — Telephone Encounter (Signed)
Patient called and left a voice mail message stating,"I was admitted over the weekend for blood clots in my lungs and legs. I was hoping to get an appointment with Dr. Alen Blew this week. My return number is [phone]."

## 2017-07-21 DIAGNOSIS — R0609 Other forms of dyspnea: Secondary | ICD-10-CM | POA: Diagnosis not present

## 2017-07-21 DIAGNOSIS — R05 Cough: Secondary | ICD-10-CM | POA: Diagnosis not present

## 2017-07-21 DIAGNOSIS — I2699 Other pulmonary embolism without acute cor pulmonale: Secondary | ICD-10-CM | POA: Diagnosis not present

## 2017-07-22 ENCOUNTER — Ambulatory Visit (HOSPITAL_BASED_OUTPATIENT_CLINIC_OR_DEPARTMENT_OTHER): Payer: 59 | Admitting: Oncology

## 2017-07-22 ENCOUNTER — Telehealth: Payer: Self-pay

## 2017-07-22 DIAGNOSIS — I8002 Phlebitis and thrombophlebitis of superficial vessels of left lower extremity: Secondary | ICD-10-CM

## 2017-07-22 DIAGNOSIS — Z7901 Long term (current) use of anticoagulants: Secondary | ICD-10-CM

## 2017-07-22 DIAGNOSIS — I2699 Other pulmonary embolism without acute cor pulmonale: Secondary | ICD-10-CM

## 2017-07-22 DIAGNOSIS — Z1211 Encounter for screening for malignant neoplasm of colon: Secondary | ICD-10-CM

## 2017-07-22 MED ORDER — RIVAROXABAN 20 MG PO TABS: 20.0000 mg | ORAL_TABLET | Freq: Every day | ORAL | 5 refills | Status: DC

## 2017-07-22 NOTE — Telephone Encounter (Signed)
Printed avs and calender for upcoming appointment. Per 11/7 los. Sending Darlene a pre auth. Request.

## 2017-07-22 NOTE — Progress Notes (Signed)
Hematology and Oncology Follow Up Visit  Jose [name] [id number] Oct 14, 1970 46 y.o. 07/22/2017 1:39 PM Annamarie Dawley, MD   Principle Diagnosis: 46 year old gentleman with: 1. Recurrent superficial phlebitis the most recent of which was in April 2017. No clear-cut etiology identified. His hypocoagulable panel is unremarkable. 2.  Pulmonary embolism diagnosed in July 18, 2017.   Current therapy: Xarelto as full dose anticoagulation.  Interim History:  Patient presents today for a follow-up visit. Since the last visit, he has been doing reasonably well until last week.  He recently returned from a long flight from [place] and subsequently started developing chest pain and shortness of breath.  He presented urgently to the emergency department and a CT scan of the chest showed acute pulmonary embolism.  He was discharged on Xarelto after lower extremity Doppler showed right popliteal DVT.  Since his discharge, he has tolerated Xarelto without complications and his chest pain has improved.  He is no longer reporting any shortness of breath or difficulty breathing.    He denies any recent hospitalizations, orthopedic surgery or severe immobilization.  He did have 2 flights to [place] in the last month and have resumed smoking close to 10 cigarettes a day.  He also reported that he was not able to move around as much during those flights.  He denies any recent exacerbation for his Crohn's disease.  He does not report any headaches, blurry vision, syncope or seizures. He does not report any fevers, chills or sweats. He does not report any cough, wheezing or hemoptysis. He does not report any nausea, vomiting or abdominal pain. He does not report any frequency urgency or hesitancy. He does not report any skeletal complaints of arthralgias or myalgias. Remaining review of systems unremarkable  Medications: I have reviewed the patient's current medications.  Current  Outpatient Medications  Medication Sig Dispense Refill  . acetaminophen (TYLENOL) 325 MG tablet Take 2 tablets (650 mg total) by mouth every 6 (six) hours as needed for mild pain or moderate pain (or Fever >/= 101).    . colesevelam (WELCHOL) 625 MG tablet Take 2 tablets (1,250 mg total) by mouth 2 (two) times daily with a meal. 360 tablet 3  . fluticasone (FLONASE) 50 MCG/ACT nasal spray Place 1 spray into the nose daily as needed for congestion.    . InFLIXimab (REMICADE IV) Inject into the vein every 8 (eight) weeks.      Marland Kitchen omeprazole (PRILOSEC) 20 MG capsule Take 20 mg by mouth daily as needed for heartburn.  5  . Rivaroxaban 15 & 20 MG TBPK Take as directed on package: Start with one 15mg  tablet by mouth twice a day with food. On Day 22, switch to one 20mg  tablet once a day with food.  Pharmacy: Please discard the first tablet from the pack which he has received in the hospital. 51 each 0   No current facility-administered medications for this visit.      Allergies:  Allergies  Allergen Reactions  . Statins Other (See Comments)    Chest pain    Past Medical History, Surgical history, Social history, and Family History were reviewed and updated.   Physical Exam: Blood pressure 116/70, pulse 94, temperature 98.4 F (36.9 C), temperature source Oral, resp. rate 18, height 5\' 8"  (1.727 m), weight 194 lb 4.8 oz (88.1 kg), SpO2 98 %. ECOG: 0 General appearance: alert and cooperative.  Without distress. Head: Normocephalic, without obvious abnormality no oral ulcers or thrush. Neck: no adenopathy  Lymph nodes: Cervical, supraclavicular, and axillary nodes normal. Heart:regular rate and rhythm, S1, S2 normal, no murmur, click, rub or gallop Lung:chest clear, no wheezing, rales, normal symmetric air entry.  Abdomin: soft, non-tender, without masses or organomegaly no shifting dullness or ascites. EXT:no erythema, induration, or nodules   Lab Results: Lab Results  Component Value  Date   WBC 8.9 07/17/2017   HGB 13.8 07/17/2017   HCT 41.2 07/17/2017   MCV 86.6 07/17/2017   PLT 178 07/17/2017     Chemistry      Component Value Date/Time   NA 135 07/17/2017 2332   K 3.8 07/17/2017 2332   CL 102 07/17/2017 2332   CO2 28 07/17/2017 2332   BUN 15 07/17/2017 2332   CREATININE 1.22 07/17/2017 2332      Component Value Date/Time   CALCIUM 9.3 07/17/2017 2332   ALKPHOS 72 07/10/2017 0909   AST 27 07/10/2017 0909   ALT 25 07/10/2017 0909   BILITOT 0.5 07/10/2017 0909     EXAM: CT ANGIOGRAPHY CHEST WITH CONTRAST  TECHNIQUE: Multidetector CT imaging of the chest was performed using the standard protocol during bolus administration of intravenous contrast. Multiplanar CT image reconstructions and MIPs were obtained to evaluate the vascular anatomy.  CONTRAST:  100 cc Isovue 370  COMPARISON:  Chest radiograph dated 07/17/2017  FINDINGS: Cardiovascular: There is no cardiomegaly or pericardial effusion. The thoracic aorta is unremarkable. The origins of the great vessels of the aortic arch appear patent. Bilateral lobar pulmonary emboli with extension into the segmental and subsegmental branches. No definite evidence of right heart strain. The right ventricle measures approximately 4.7 cm in diameter and the left ventricle measures approximately 5 cm in diameter.  Mediastinum/Nodes: There is no hilar or mediastinal adenopathy. Esophagus and the thyroid gland are grossly unremarkable. No mediastinal fluid collection.  Lungs/Pleura: Minimal bibasilar atelectatic changes. A focal subpleural area of hazy density in the lateral basal segment of the right lower lobe may represent an area of pulmonary infarct. Smaller sub pleural density in the left upper lobe may represent atelectatic changes. There is no pleural effusion or pneumothorax. The central airways are patent.  Upper Abdomen: No acute abnormality.  Musculoskeletal: No chest wall  abnormality. No acute or significant osseous findings.  Review of the MIP images confirms the above findings.  IMPRESSION: Bilateral pulmonary artery emboli involving the lobar branches with extension into the segmental and subsegmental branches. No definite CT evidence of right cardiac straining.  These results were called by telephone at the time of interpretation on 07/18/2017 at 12:41 am to Dr. Shanon Rosser , who verbally acknowledged these results.     Impression and Plan:  46 year old gentleman with:  1.Recurrent superficial phlebitis in his left leg noted for the last 6 months. He has been treated with anti-inflammatory and pain medication.  His hypocoagulable panel obtained in August 2017 showed no evidence of inherited or acquired thrombophilia. The etiology of his superficial phlebitis remains unclear.   2. Pulmonary embolism diagnosed in November 2018.  He presented with right lower extremity pain and documented popliteal vein thrombosis as well as pulmonary embolism on a CT scan done July 18, 2017.  The etiology of his acute thrombosis remains unclear but provoking factors such as a long flight, smoking and Crohn's disease.  He is currently on Remicade which I doubt is contributing at this time.  His thrombosis could also be idiopathic in nature.  A management standpoint, I recommended obtaining CT scan of the abdomen  and pelvis to rule out occult malignancy.  He is agreeable to have that done in the immediate future.  I also recommended continuing Xarelto for at least 6 months.  Upon completing 6 months of therapy, we will discuss the risks and benefits of continuing this medication indefinitely.  Given the recurrent thrombosis as well as the fact he is a male with a pulmonary embolism there is a consideration for possible long-term anticoagulation.  He will follow-up in 6 months to discuss risks and benefits of continuing this medication indefinitely.   Zola Button, MD 11/7/20181:39 PM

## 2017-07-24 ENCOUNTER — Encounter (HOSPITAL_BASED_OUTPATIENT_CLINIC_OR_DEPARTMENT_OTHER): Payer: Self-pay

## 2017-07-24 ENCOUNTER — Telehealth: Payer: Self-pay

## 2017-07-24 ENCOUNTER — Other Ambulatory Visit: Payer: Self-pay | Admitting: Oncology

## 2017-07-24 ENCOUNTER — Ambulatory Visit (HOSPITAL_BASED_OUTPATIENT_CLINIC_OR_DEPARTMENT_OTHER)
Admission: RE | Admit: 2017-07-24 | Discharge: 2017-07-24 | Disposition: A | Discharge status: Home / Self Care | Payer: 59 | Source: Ambulatory Visit | Attending: Oncology | Admitting: Oncology

## 2017-07-24 DIAGNOSIS — N289 Disorder of kidney and ureter, unspecified: Secondary | ICD-10-CM | POA: Diagnosis not present

## 2017-07-24 DIAGNOSIS — C189 Malignant neoplasm of colon, unspecified: Secondary | ICD-10-CM | POA: Diagnosis not present

## 2017-07-24 DIAGNOSIS — Z1211 Encounter for screening for malignant neoplasm of colon: Secondary | ICD-10-CM | POA: Diagnosis not present

## 2017-07-24 DIAGNOSIS — N2889 Other specified disorders of kidney and ureter: Secondary | ICD-10-CM

## 2017-07-24 DIAGNOSIS — I700 Atherosclerosis of aorta: Secondary | ICD-10-CM | POA: Diagnosis not present

## 2017-07-24 MED ORDER — IOPAMIDOL (ISOVUE-300) INJECTION 61%
100.0000 mL | Freq: Once | INTRAVENOUS | Status: AC | PRN
  Administered 2017-07-24: 100 mL via INTRAVENOUS

## 2017-07-24 NOTE — Telephone Encounter (Signed)
Call report CT abd pelvis, impression #2

## 2017-08-01 ENCOUNTER — Ambulatory Visit (HOSPITAL_BASED_OUTPATIENT_CLINIC_OR_DEPARTMENT_OTHER)
Admission: RE | Admit: 2017-08-01 | Discharge: 2017-08-01 | Disposition: A | Discharge status: Home / Self Care | Payer: 59 | Source: Ambulatory Visit | Attending: Oncology | Admitting: Oncology

## 2017-08-01 DIAGNOSIS — N2889 Other specified disorders of kidney and ureter: Secondary | ICD-10-CM | POA: Diagnosis present

## 2017-08-01 DIAGNOSIS — N281 Cyst of kidney, acquired: Secondary | ICD-10-CM | POA: Insufficient documentation

## 2017-08-01 MED ORDER — GADOBENATE DIMEGLUMINE 529 MG/ML IV SOLN
20.0000 mL | Freq: Once | INTRAVENOUS | Status: AC | PRN
  Administered 2017-08-01: 18 mL via INTRAVENOUS

## 2017-09-03 ENCOUNTER — Other Ambulatory Visit (HOSPITAL_COMMUNITY): Payer: Self-pay | Admitting: *Deleted

## 2017-09-04 ENCOUNTER — Encounter (HOSPITAL_COMMUNITY)
Admission: RE | Admit: 2017-09-04 | Discharge: 2017-09-04 | Disposition: A | Discharge status: Home / Self Care | Payer: 59 | Source: Ambulatory Visit | Attending: Gastroenterology | Admitting: Gastroenterology

## 2017-09-04 ENCOUNTER — Encounter (HOSPITAL_COMMUNITY): Payer: 59

## 2017-09-04 DIAGNOSIS — K5090 Crohn's disease, unspecified, without complications: Secondary | ICD-10-CM | POA: Insufficient documentation

## 2017-09-04 MED ORDER — METHYLPREDNISOLONE SODIUM SUCC 40 MG IJ SOLR
40.0000 mg | Freq: Once | INTRAMUSCULAR | Status: AC
  Administered 2017-09-04: 40 mg via INTRAVENOUS

## 2017-09-04 MED ORDER — METHYLPREDNISOLONE SODIUM SUCC 40 MG IJ SOLR
INTRAMUSCULAR | Status: AC
  Administered 2017-09-04: 40 mg via INTRAVENOUS
  Filled 2017-09-04: qty 1

## 2017-09-04 MED ORDER — ACETAMINOPHEN 325 MG PO TABS
ORAL_TABLET | ORAL | Status: AC
  Administered 2017-09-04: 650 mg via ORAL
  Filled 2017-09-04: qty 2

## 2017-09-04 MED ORDER — DIPHENHYDRAMINE HCL 50 MG/ML IJ SOLN
50.0000 mg | Freq: Once | INTRAMUSCULAR | Status: AC
  Administered 2017-09-04: 50 mg via INTRAVENOUS

## 2017-09-04 MED ORDER — ACETAMINOPHEN 325 MG PO TABS
650.0000 mg | ORAL_TABLET | Freq: Four times a day (QID) | ORAL | Status: DC | PRN
  Administered 2017-09-04: 650 mg via ORAL

## 2017-09-04 MED ORDER — DIPHENHYDRAMINE HCL 50 MG/ML IJ SOLN
INTRAMUSCULAR | Status: AC
Start: 2017-09-04 — End: 2017-09-04
  Administered 2017-09-04: 50 mg via INTRAVENOUS
  Filled 2017-09-04: qty 1

## 2017-09-04 MED ORDER — SODIUM CHLORIDE 0.9 % IV SOLN
7.5000 mg/kg | INTRAVENOUS | Status: DC
  Administered 2017-09-04: 700 mg via INTRAVENOUS
  Filled 2017-09-04: qty 70

## 2017-10-06 ENCOUNTER — Ambulatory Visit (INDEPENDENT_AMBULATORY_CARE_PROVIDER_SITE_OTHER): Payer: 59 | Admitting: Cardiology

## 2017-10-06 ENCOUNTER — Encounter: Payer: Self-pay | Admitting: Cardiology

## 2017-10-06 VITALS — BP 124/82 | HR 71 | Ht 68.0 in | Wt 210.0 lb

## 2017-10-06 DIAGNOSIS — E785 Hyperlipidemia, unspecified: Secondary | ICD-10-CM | POA: Diagnosis not present

## 2017-10-06 DIAGNOSIS — I809 Phlebitis and thrombophlebitis of unspecified site: Secondary | ICD-10-CM

## 2017-10-06 DIAGNOSIS — Z8249 Family history of ischemic heart disease and other diseases of the circulatory system: Secondary | ICD-10-CM | POA: Diagnosis not present

## 2017-10-06 DIAGNOSIS — E7849 Other hyperlipidemia: Secondary | ICD-10-CM

## 2017-10-06 NOTE — Progress Notes (Signed)
Jose [name]. [address] [place], [place]  [address] Phone: [phone] Fax:  [phone]  Date:  10/06/2017   ID:  Jose [name], DOB 09-07-1971, MRN [id number]  PCP:  Velna Ochs, MD   History of Present Illness: Jose [name] is a 47 y.o. male with Crohn's disease currently in remission with hyperlipidemia intolerant to statin use here for followup.   Nuclear stress 03/30/15-low risk, no ischemia, 10 minutes 45 seconds.  No chest pain, no syncope, no bleeding.  Brother had bypass surgery in 2015.  Quit smoking. 09/2015  Had superficial thrombopheltisits after travel.No DVT. Bactrim. Has also had some thrombus at the infusion site of Remicade.  Overall he is very proud  out of his smoking cessation. He is not having any chest pain, sicca B, bleeding, orthopnea, PND.  Wt Readings from Last 3 Encounters:  10/06/17 210 lb (95.3 kg)  09/04/17 200 lb (90.7 kg)  07/22/17 194 lb 4.8 oz (88.1 kg)     Past Medical History:  Diagnosis Date  . Crohn's disease ([place])   . Hyperlipidemia   . Thrombophlebitis     History reviewed. No pertinent surgical history.  Current Outpatient Medications  Medication Sig Dispense Refill  . acetaminophen (TYLENOL) 325 MG tablet Take 2 tablets (650 mg total) by mouth every 6 (six) hours as needed for mild pain or moderate pain (or Fever >/= 101).    . colesevelam (WELCHOL) 625 MG tablet Take 2 tablets (1,250 mg total) by mouth 2 (two) times daily with a meal. 360 tablet 3  . fluticasone (FLONASE) 50 MCG/ACT nasal spray Place 1 spray into the nose daily as needed for congestion.    . InFLIXimab (REMICADE IV) Inject into the vein every 8 (eight) weeks.      Marland Kitchen omeprazole (PRILOSEC) 20 MG capsule Take 20 mg by mouth daily as needed for heartburn.  5  . rivaroxaban (XARELTO) 20 MG TABS tablet Take 1 tablet (20 mg total) daily with supper by mouth. 30 tablet 5   No current facility-administered medications for this visit.      Allergies:    Allergies  Allergen Reactions  . Statins Other (See Comments)    Chest pain    Social History:  The patient  reports that he has been smoking cigarettes.  He has been smoking about 0.50 packs per day. he has never used smokeless tobacco. He reports that he does not drink alcohol or use drugs.   ROS:  Please see the history of present illness.   Denies any fevers, chills, orthopnea, PND   PHYSICAL EXAM: VS:  BP 124/82   Pulse 71   Ht 5' 8"  (1.727 m)   Wt 210 lb (95.3 kg)   BMI 31.93 kg/m  Well nourished, well developed, in no acute distress  HEENT: normal  Neck: no JVD  Cardiac:  normal S1, S2; RRR; no murmur  Lungs:  clear to auscultation bilaterally, no wheezing, rhonchi or rales  Abd: soft, nontender, no hepatomegaly  Ext: no edema  Skin: warm and dry  Neuro: no focal abnormalities noted  EKG: EKG was ordered today 03/14/16-sinus rhythm, 72  12/28/13-sinus rhythm rate 68 with no other specific abnormalities, Labs: 09/16/13-creatinine 0.92, glucose 130, hemoglobin 14.6, LDL 106, small LDL particle size 520 within normal range.  03/04/17-HDL 41, LDL 101, triglycerides 126.  Nuclear stress test 03/30/15: Oh risk, no ischemia, normal EF  ASSESSMENT AND PLAN:   1. Hyperlipidemia-medications reviewed. He has  had statin intolerance.  Previously on Niaspan, aspirin) flushing.  This is been stopped.  Rechecking lipid panel in 6 months. Continue with exercise.  lipid panel unchanged. LDL small particle size excellent previously. 2. Family history of CAD-brother in 2015 had bypass surgery. He was a former smoker. Continue to modify risk factors.  3. Tobacco use-excellent job at tobacco cessation over the last 5 months. Continue. No longer smoking. Since stopping, clotting issue seems to be correlating.  4. Crohn's disease is stable on Q8 week Remicade. Dr. Michail Sermon 5. Superficial thrombophlebitis/ DVT/PE- he has had recurring episodes. In fact he has thrombosis at the  Remicade infusion site previously. Unclear etiology, hypercoagulable workup neg. Hematology, Dr. Alen Blew -  On Xarelto, perhaps lifelong (I think this is a good idea, perhaps low dose after 6 months of PE tx).  No MRI evidence of renal cancer.  6. 6 months. Lipids and Alt.   Signed, [name], MD [facility]  10/06/2017 10:46 AM

## 2017-10-06 NOTE — Patient Instructions (Signed)
Medication Instructions:  Your physician recommends that you continue on your current medications as directed. Please refer to the Current Medication list given to you today.  Labwork: Your physician recommends that you return for lab work a week prior to seeing [name] back in 6 months (Lipid, liver)   Testing/Procedures: None  Follow-Up: Your physician wants you to follow-up in: 6 months with [name].  You will receive a reminder letter in the mail two months in advance. If you don't receive a letter, please call our office to schedule the follow-up appointment.   Any Other Special Instructions Will Be Listed Below (If Applicable).     If you need a refill on your cardiac medications before your next appointment, please call your pharmacy.

## 2017-10-14 ENCOUNTER — Ambulatory Visit (INDEPENDENT_AMBULATORY_CARE_PROVIDER_SITE_OTHER): Payer: 59 | Admitting: Family Medicine

## 2017-10-14 ENCOUNTER — Encounter: Payer: Self-pay | Admitting: Family Medicine

## 2017-10-14 VITALS — BP 110/72 | HR 66 | Temp 98.0°F | Ht 68.0 in | Wt 210.5 lb

## 2017-10-14 DIAGNOSIS — I824Z1 Acute embolism and thrombosis of unspecified deep veins of right distal lower extremity: Secondary | ICD-10-CM | POA: Diagnosis not present

## 2017-10-14 NOTE — Progress Notes (Signed)
Chief Complaint  Patient presents with  . Establish Care       New Patient Visit SUBJECTIVE: HPI: Jose [name] is an 47 y.o.male who is being seen for establishing care.  The patient was previously seen at [facility], dr retired.  The patient has a history of a DVT and pulmonary embolism sustained after a long flight in November 2018.  He is currently on Xarelto 20 mg daily and tolerating it well.  He is followed by the hematology team.  The plan is 6 months and then reevaluating.  He has stopped smoking and is more physically active.  He has no personal or family history of clots.  His hypercoagulability workup is negative.  Allergies  Allergen Reactions  . Statins Other (See Comments)    Chest pain    Past Medical History:  Diagnosis Date  . Crohn's disease ([place])   . Hyperlipidemia   . Thrombophlebitis    History reviewed. No pertinent surgical history. Social History   Socioeconomic History  . Marital status: Married  Tobacco Use  . Smoking status: Current Every Day Smoker    Packs/day: 0.50    Types: Cigarettes  . Smokeless tobacco: Never Used  Substance and Sexual Activity  . Alcohol use: No  . Drug use: No   Family History  Problem Relation Age of Onset  . Heart disease Mother      Current Outpatient Medications:  .  colesevelam (WELCHOL) 625 MG tablet, Take 2 tablets (1,250 mg total) by mouth 2 (two) times daily with a meal., Disp: 360 tablet, Rfl: 3 .  InFLIXimab (REMICADE IV), Inject into the vein every 8 (eight) weeks.  , Disp: , Rfl:  .  omeprazole (PRILOSEC) 20 MG capsule, Take 20 mg by mouth daily as needed for heartburn., Disp: , Rfl: 5 .  rivaroxaban (XARELTO) 20 MG TABS tablet, Take 1 tablet (20 mg total) daily with supper by mouth., Disp: 30 tablet, Rfl: 5  ROS Cardiovascular: Denies chest pain  Respiratory: Denies dyspnea   OBJECTIVE: BP 110/72 (BP Location: Left Arm, Patient Position: Sitting, Cuff Size: Large)   Pulse 66   Temp 98  F (36.7 C) (Oral)   Ht 5' 8"  (1.727 m)   Wt 210 lb 8 oz (95.5 kg)   SpO2 98%   BMI 32.01 kg/m   Constitutional: -  VS reviewed -  Well developed, well nourished, appears stated age -  No apparent distress  Psychiatric: -  Oriented to person, place, and time -  Memory intact -  Affect and mood normal -  Fluent conversation, good eye contact -  Judgment and insight age appropriate  Eye: -  Conjunctivae clear, no discharge -  Pupils symmetric, round, reactive to light  ENMT: -  MMM    Pharynx moist, no exudate, no erythema  Neck: -  No gross swelling, no palpable masses -  Thyroid midline, not enlarged, mobile, no palpable masses  Cardiovascular: -  RRR -  No bruits -  No LE edema  Respiratory: -  Normal respiratory effort, no accessory muscle use, no retraction -  Breath sounds equal, no wheezes, no ronchi, no crackles  Musculoskeletal: -  No clubbing, no cyanosis -  Gait normal  Skin: -  No significant lesion on inspection -  Warm and dry to palpation   ASSESSMENT/PLAN: Deep vein thrombosis (DVT) of distal vein of right lower extremity, unspecified chronicity (HCC)  Patient instructed to sign release of records form from her previous PCP.  Will refill Flonase prn.  Sees specialists for many of issues.  Patient should return prn. The patient voiced understanding and agreement to the plan.   G. L. Garcia, DO 10/14/17  10:56 AM

## 2017-10-14 NOTE — Progress Notes (Signed)
Pre visit review using our clinic review tool, if applicable. No additional management support is needed unless otherwise documented below in the visit note. 

## 2017-10-14 NOTE — Patient Instructions (Addendum)
Let us know if you need anything.  We should start seeing each other yearly when you turn 48.   When you need more Flonase, send a MyChart message.

## 2017-10-29 ENCOUNTER — Other Ambulatory Visit (HOSPITAL_COMMUNITY): Payer: Self-pay

## 2017-10-29 NOTE — Addendum Note (Signed)
Addended by: Particia Jasper A on: 10/29/2017 12:06 PM   Modules accepted: Orders

## 2017-10-30 ENCOUNTER — Encounter (HOSPITAL_COMMUNITY)
Admission: RE | Admit: 2017-10-30 | Discharge: 2017-10-30 | Disposition: A | Discharge status: Home / Self Care | Payer: 59 | Source: Ambulatory Visit | Attending: Gastroenterology | Admitting: Gastroenterology

## 2017-10-30 DIAGNOSIS — K5090 Crohn's disease, unspecified, without complications: Secondary | ICD-10-CM | POA: Insufficient documentation

## 2017-10-30 LAB — CBC WITH DIFFERENTIAL/PLATELET
Basophils Absolute: 0 10*3/uL (ref 0.0–0.1)
Basophils Relative: 1 %
Eosinophils Absolute: 0.2 10*3/uL (ref 0.0–0.7)
Eosinophils Relative: 4 %
HCT: 41.7 % (ref 39.0–52.0)
Hemoglobin: 14.1 g/dL (ref 13.0–17.0)
LYMPHS PCT: 34 %
Lymphs Abs: 1.6 10*3/uL (ref 0.7–4.0)
MCH: 29 pg (ref 26.0–34.0)
MCHC: 33.8 g/dL (ref 30.0–36.0)
MCV: 85.6 fL (ref 78.0–100.0)
MONO ABS: 0.5 10*3/uL (ref 0.1–1.0)
MONOS PCT: 11 %
NEUTROS ABS: 2.3 10*3/uL (ref 1.7–7.7)
Neutrophils Relative %: 50 %
Platelets: 195 10*3/uL (ref 150–400)
RBC: 4.87 MIL/uL (ref 4.22–5.81)
RDW: 12.7 % (ref 11.5–15.5)
WBC: 4.6 10*3/uL (ref 4.0–10.5)

## 2017-10-30 LAB — COMPREHENSIVE METABOLIC PANEL
ALT: 39 U/L (ref 17–63)
ANION GAP: 9 (ref 5–15)
AST: 35 U/L (ref 15–41)
Albumin: 3.9 g/dL (ref 3.5–5.0)
Alkaline Phosphatase: 70 U/L (ref 38–126)
BILIRUBIN TOTAL: 0.5 mg/dL (ref 0.3–1.2)
BUN: 15 mg/dL (ref 6–20)
CO2: 23 mmol/L (ref 22–32)
Calcium: 9.5 mg/dL (ref 8.9–10.3)
Chloride: 107 mmol/L (ref 101–111)
Creatinine, Ser: 1.16 mg/dL (ref 0.61–1.24)
Glucose, Bld: 94 mg/dL (ref 65–99)
POTASSIUM: 4.3 mmol/L (ref 3.5–5.1)
Sodium: 139 mmol/L (ref 135–145)
TOTAL PROTEIN: 7 g/dL (ref 6.5–8.1)

## 2017-10-30 MED ORDER — DIPHENHYDRAMINE HCL 50 MG/ML IJ SOLN
INTRAMUSCULAR | Status: AC
  Filled 2017-10-30: qty 1

## 2017-10-30 MED ORDER — DIPHENHYDRAMINE HCL 50 MG/ML IJ SOLN
50.0000 mg | INTRAMUSCULAR | Status: DC
  Administered 2017-10-30: 50 mg via INTRAVENOUS

## 2017-10-30 MED ORDER — SODIUM CHLORIDE 0.9 % IV SOLN
7.5000 mg/kg | INTRAVENOUS | Status: DC
  Administered 2017-10-30: 700 mg via INTRAVENOUS
  Filled 2017-10-30: qty 70

## 2017-10-30 MED ORDER — ACETAMINOPHEN 325 MG PO TABS
ORAL_TABLET | ORAL | Status: AC
  Administered 2017-10-30: 650 mg
  Filled 2017-10-30: qty 2

## 2017-10-30 MED ORDER — METHYLPREDNISOLONE SODIUM SUCC 40 MG IJ SOLR
40.0000 mg | INTRAMUSCULAR | Status: DC
  Administered 2017-10-30: 40 mg via INTRAVENOUS

## 2017-10-30 MED ORDER — METHYLPREDNISOLONE SODIUM SUCC 40 MG IJ SOLR
INTRAMUSCULAR | Status: AC
  Filled 2017-10-30: qty 1

## 2017-10-30 MED ORDER — ACETAMINOPHEN 325 MG PO TABS: 650.0000 mg | ORAL_TABLET | Freq: Four times a day (QID) | ORAL | Status: DC | PRN

## 2017-10-30 MED ORDER — SODIUM CHLORIDE 0.9 % IV SOLN: INTRAVENOUS | Status: DC

## 2017-11-06 ENCOUNTER — Ambulatory Visit (INDEPENDENT_AMBULATORY_CARE_PROVIDER_SITE_OTHER): Payer: 59 | Admitting: Family Medicine

## 2017-11-06 ENCOUNTER — Encounter: Payer: Self-pay | Admitting: Family Medicine

## 2017-11-06 VITALS — BP 118/78 | HR 70 | Temp 98.2°F | Ht 68.0 in | Wt 208.2 lb

## 2017-11-06 DIAGNOSIS — M79652 Pain in left thigh: Secondary | ICD-10-CM

## 2017-11-06 DIAGNOSIS — M79651 Pain in right thigh: Secondary | ICD-10-CM

## 2017-11-06 NOTE — Patient Instructions (Signed)
Ice/cold pack over area for 10-15 min twice daily.  Heat (pad or rice pillow in microwave) over affected area, 10-15 minutes twice daily.   Consider changing your cardio routine to involve more cycling/ellipitical and less jogging. Keep stretching.  Wear compression stockings if you are traveling or know you will be sitting for long periods of time (90 minutes or more).  Let us know if you need anything.

## 2017-11-06 NOTE — Progress Notes (Signed)
Pre visit review using our clinic review tool, if applicable. No additional management support is needed unless otherwise documented below in the visit note. 

## 2017-11-06 NOTE — Progress Notes (Signed)
Musculoskeletal Exam  Patient: Jose [name] DOB: 1970-10-14  DOS: 11/06/2017  SUBJECTIVE:  Chief Complaint:   Chief Complaint  Patient presents with  . Follow-up    left leg pain    Jose [name] is a 47 y.o.  male for evaluation and treatment of b/l thigh pain.   Onset:  3 days ago. No injury or change in activity.  Location: medial thigh, worse on L Character:  aching  Progression of issue:  has slightly improved Associated symptoms: He notices bruising Treatment: to date has been none.   Neurovascular symptoms: no Hx of DVT, currently on Xarelto. He runs 3x/week, drinks lots of water.  ROS: Musculoskeletal/Extremities: +thigh pain  Past Medical History:  Diagnosis Date  . Crohn's disease ([place])   . Hyperlipidemia   . Thrombophlebitis     Objective: VITAL SIGNS: BP 118/78 (BP Location: Left Arm, Patient Position: Sitting, Cuff Size: Large)   Pulse 70   Temp 98.2 F (36.8 C) (Oral)   Ht 5' 8"  (1.727 m)   Wt 208 lb 4 oz (94.5 kg)   SpO2 97%   BMI 31.66 kg/m  Constitutional: Well formed, well developed. No acute distress. Cardiovascular: Brisk cap refill Thorax & Lungs: No accessory muscle use Musculoskeletal: B/l thigh.   Normal active range of motion: yes.   Normal passive range of motion: yes Tenderness to palpation: yes, over gracilis b/l Deformity: no Ecchymosis: no Neurologic: Normal sensory function. No focal deficits noted.  Psychiatric: Normal mood. Age appropriate judgment and insight. Alert & oriented x 3.    Assessment:  Pain in both thighs  Plan: Ice, heat, Tylenol prn. Change cardio from jogging 3x/week and integrate cycling/elliptical. Cont stretching. I think LE dopplers would be low yield given his physical activity, lack of inciting event and that he is compliant with Xarelto.  F/u prn. The patient voiced understanding and agreement to the plan.   [place], DO 11/06/17  9:43 AM

## 2017-12-25 ENCOUNTER — Encounter (HOSPITAL_COMMUNITY): Payer: 59

## 2018-01-01 ENCOUNTER — Ambulatory Visit (HOSPITAL_COMMUNITY)
Admission: RE | Admit: 2018-01-01 | Discharge: 2018-01-01 | Disposition: A | Discharge status: Home / Self Care | Payer: 59 | Source: Ambulatory Visit | Attending: Gastroenterology | Admitting: Gastroenterology

## 2018-01-01 ENCOUNTER — Encounter (HOSPITAL_COMMUNITY): Payer: Self-pay

## 2018-01-01 DIAGNOSIS — K5090 Crohn's disease, unspecified, without complications: Secondary | ICD-10-CM | POA: Diagnosis not present

## 2018-01-01 MED ORDER — METHYLPREDNISOLONE SODIUM SUCC 40 MG IJ SOLR
40.0000 mg | INTRAMUSCULAR | Status: DC
  Administered 2018-01-01: 40 mg via INTRAVENOUS

## 2018-01-01 MED ORDER — ACETAMINOPHEN 325 MG PO TABS
650.0000 mg | ORAL_TABLET | Freq: Four times a day (QID) | ORAL | Status: DC | PRN
  Administered 2018-01-01: 650 mg via ORAL

## 2018-01-01 MED ORDER — METHYLPREDNISOLONE SODIUM SUCC 40 MG IJ SOLR
INTRAMUSCULAR | Status: AC
  Administered 2018-01-01: 40 mg via INTRAVENOUS
  Filled 2018-01-01: qty 1

## 2018-01-01 MED ORDER — SODIUM CHLORIDE 0.9 % IV SOLN
7.5000 mg/kg | INTRAVENOUS | Status: DC
  Administered 2018-01-01: 700 mg via INTRAVENOUS
  Filled 2018-01-01: qty 70

## 2018-01-01 MED ORDER — ACETAMINOPHEN 325 MG PO TABS
ORAL_TABLET | ORAL | Status: AC
  Administered 2018-01-01: 650 mg via ORAL
  Filled 2018-01-01: qty 2

## 2018-01-01 MED ORDER — SODIUM CHLORIDE 0.9 % IV SOLN
INTRAVENOUS | Status: DC
  Administered 2018-01-01: 09:00:00 via INTRAVENOUS

## 2018-01-01 MED ORDER — DIPHENHYDRAMINE HCL 50 MG/ML IJ SOLN
INTRAMUSCULAR | Status: AC
  Administered 2018-01-01: 50 mg via INTRAVENOUS
  Filled 2018-01-01: qty 1

## 2018-01-01 MED ORDER — DIPHENHYDRAMINE HCL 50 MG/ML IJ SOLN
50.0000 mg | INTRAMUSCULAR | Status: DC
  Administered 2018-01-01: 50 mg via INTRAVENOUS

## 2018-01-08 LAB — QUANTIFERON-TB GOLD PLUS (RQFGPL)
QUANTIFERON TB2 AG VALUE: 0.04 [IU]/mL
QuantiFERON Mitogen Value: >10 IU/mL
QuantiFERON Nil Value: 0.04 IU/mL
QuantiFERON TB1 Ag Value: 0.03 IU/mL

## 2018-01-08 LAB — QUANTIFERON-TB GOLD PLUS: QuantiFERON-TB Gold Plus: NEGATIVE

## 2018-01-13 ENCOUNTER — Ambulatory Visit: Payer: 59 | Admitting: Oncology

## 2018-01-28 ENCOUNTER — Inpatient Hospital Stay: Payer: 59 | Attending: Oncology | Admitting: Oncology

## 2018-01-28 ENCOUNTER — Telehealth: Payer: Self-pay | Admitting: Oncology

## 2018-01-28 VITALS — BP 132/71 | HR 68 | Temp 98.2°F | Resp 16 | Ht 68.0 in | Wt 206.3 lb

## 2018-01-28 DIAGNOSIS — Z7902 Long term (current) use of antithrombotics/antiplatelets: Secondary | ICD-10-CM

## 2018-01-28 DIAGNOSIS — Z86711 Personal history of pulmonary embolism: Secondary | ICD-10-CM | POA: Insufficient documentation

## 2018-01-28 DIAGNOSIS — N2889 Other specified disorders of kidney and ureter: Secondary | ICD-10-CM | POA: Diagnosis not present

## 2018-01-28 DIAGNOSIS — Z7901 Long term (current) use of anticoagulants: Secondary | ICD-10-CM | POA: Insufficient documentation

## 2018-01-28 DIAGNOSIS — I2699 Other pulmonary embolism without acute cor pulmonale: Secondary | ICD-10-CM

## 2018-01-28 NOTE — Progress Notes (Signed)
Hematology and Oncology Follow Up Visit  Jose [name] [id number] 1971/07/13 47 y.o. 01/28/2018 10:35 AM [place], [place], Jamie Kato, MD   Principle Diagnosis: 47 year old man with:  1. Recurrent superficial phlebitis the most recent of which was in April 2017.  2. Pulmonary embolism diagnosed in July 18, 2017.  Hypercoagulable work-up is unrevealing.   Current therapy: Xarelto as full dose anticoagulation.  Interim History: Jose [name]  presents today for a follow-up.  Reports no major changes since the last visit.  He remains on Xarelto without complications.  He denies any bleeding or thrombosis episodes.  He denies any complications related to this medication including difficulties obtaining or taking it.  He denies any recent complications or exacerbations of his colitis.  He does not report any headaches, blurry vision, syncope or seizures. He does not report any fevers, chills or sweats. He does not report any cough, wheezing or hemoptysis. He does not report any nausea, vomiting or abdominal pain. He does not report any frequency urgency or hesitancy. He does not report any arthralgias or myalgias.  He denies any lymphadenopathy or petechiae.  He denies any easy bruising.  He denies any skin rashes or lesions.  Remaining review of systems is negative.  Medications: I have reviewed the patient's current medications.  Current Outpatient Medications  Medication Sig Dispense Refill  . colesevelam (WELCHOL) 625 MG tablet Take 2 tablets (1,250 mg total) by mouth 2 (two) times daily with a meal. 360 tablet 3  . InFLIXimab (REMICADE IV) Inject into the vein every 8 (eight) weeks.      Marland Kitchen omeprazole (PRILOSEC) 20 MG capsule Take 20 mg by mouth daily as needed for heartburn.  5  . rivaroxaban (XARELTO) 20 MG TABS tablet Take 1 tablet (20 mg total) daily with supper by mouth. 30 tablet 5   No current facility-administered medications for this visit.      Allergies:   Allergies  Allergen Reactions  . Statins Other (See Comments)    Chest pain    Past Medical History, Surgical history, Social history, and Family History were reviewed and updated.   Physical Exam: Blood pressure 132/71, pulse 68, temperature 98.2 F (36.8 C), temperature source Oral, resp. rate 16, height 5\' 8"  (1.727 m), weight 206 lb 4.8 oz (93.6 kg), SpO2 99 %.   ECOG: 0 General appearance: Well-appearing gentleman without distress. Head: Atraumatic without abnormalities. Oropharynx: Without any thrush or ulcers. Eyes: No scleral icterus. Lymph nodes: No lymphadenopathy in the cervical, supraclavicular, and axillary nodes  Heart:regular rate and rhythm, without any murmurs or gallops.  No leg edema noted. Lung: clear without any rhonchi, wheezes or dullness to percussion. Abdomin: Soft, nontender without any rebound or guarding.  No shifting dullness or ascites. Musculoskeletal: No joint deformity or effusion. Skin: No rashes or lesions.  Lab Results: Lab Results  Component Value Date   WBC 4.6 10/30/2017   HGB 14.1 10/30/2017   HCT 41.7 10/30/2017   MCV 85.6 10/30/2017   PLT 195 10/30/2017     Chemistry      Component Value Date/Time   NA 139 10/30/2017 0850   K 4.3 10/30/2017 0850   CL 107 10/30/2017 0850   CO2 23 10/30/2017 0850   BUN 15 10/30/2017 0850   CREATININE 1.16 10/30/2017 0850      Component Value Date/Time   CALCIUM 9.5 10/30/2017 0850   ALKPHOS 70 10/30/2017 0850   AST 35 10/30/2017 0850   ALT 39 10/30/2017 0850  BILITOT 0.5 10/30/2017 0850     EXAM: MRI ABDOMEN WITHOUT AND WITH CONTRAST  TECHNIQUE: Multiplanar multisequence MR imaging of the abdomen was performed both before and after the administration of intravenous contrast. Patient returned for precontrast T1 weighted sequence on 08/08/2017, however subtraction imaging could not be performed due to different acquisition dates of T1 pre and postcontrast sequences.  CONTRAST:   76mL MULTIHANCE GADOBENATE DIMEGLUMINE 529 MG/ML IV SOLN  COMPARISON:  CT on 07/24/2017  FINDINGS: Lower Chest: No acute findings.  Hepatobiliary: No hepatic masses identified. Gallbladder is unremarkable.  Pancreas:  No mass or inflammatory changes.  Spleen: Within normal limits in size and appearance.  Adrenals/Urinary Tract: Normal adrenal glands. Multiple tiny sub-cm benign Bosniak category 1 renal cysts are noted bilaterally. A 1.9 cm lesion in lower pole the left kidney shows T1 and T2 hyperintensity, and no evidence of contrast enhancement, consistent with a benign Bosniak category 2 proteinaceous cyst. No other complex cystic or solid renal masses are identified. No evidence of hydronephrosis.  Stomach/Bowel: No evidence of obstruction, inflammatory process or abnormal fluid collections.  Vascular/Lymphatic: No pathologically enlarged lymph nodes. No abdominal aortic aneurysm.  Reproductive:  No mass or other significant abnormality.  Other:  None.  Musculoskeletal:  No suspicious bone lesions identified.  IMPRESSION: 1.9 cm benign Bosniak category 2 cyst in lower pole of left kidney, which corresponds to the lesion seen on recent CT. No evidence of renal neoplasm or other significant abnormality.   Impression and Plan:  47 year old gentleman with:  1. Superficial phlebitis noted in his left lower extremities in August 2017.  His hypercoagulable work-up showed no inherited thrombophilia.  No recent issues reported at this time.  2. Pulmonary embolism diagnosed in November 2018.  He also had documented popliteal vein thrombosis.  He has been on Xarelto since that time without any complications.  The natural course of inherited and acquired thrombophilia was discussed today with the patient in detail.  If tolerated Xarelto well for the last 6 months without complications and without any recurrent thrombosis episodes.  Risks and benefits of continuing  Xarelto beyond 6 months was discussed today.  Long-term complication associated with full dose anticoagulation was reviewed today including increased risk of bleeding from a GI source as well as more rarely CNS bleed.  Risk of stopping the Xarelto would include the recurrent thrombosis as well as pulmonary embolism which could be serious.  Etiology of his thrombosis remains unclear could be idiopathic in nature but could also be related to Remicade.  After discussion today, we have elected to continue with the Xarelto for the next 6 months and will evaluated at that time.  We will discussed the risks and benefits of continuing Xarelto beyond 12 months total at that time.  3.  Kidney mass: Has been evaluated by MRI of the abdomen on 08/01/2017.  These results were discussed with the patient today and appears to be cystic in nature without any follow-up needed.  4.  Follow-up: Will be in 6 months to follow-up on his clinical status.  25  minutes was spent with the patient face-to-face today.  More than 50% of time was dedicated to patient counseling, education and answering questions regarding his diagnosis, risk of thrombosis and options of management.  Zola Button, MD 5/16/201910:35 AM

## 2018-01-28 NOTE — Telephone Encounter (Signed)
Scheduled appt per 5/16 los - Gave patient aVS and calender per los.  

## 2018-01-30 ENCOUNTER — Other Ambulatory Visit: Payer: Self-pay | Admitting: Oncology

## 2018-02-26 ENCOUNTER — Encounter (HOSPITAL_COMMUNITY): Payer: 59

## 2018-03-04 ENCOUNTER — Other Ambulatory Visit (HOSPITAL_COMMUNITY): Payer: Self-pay

## 2018-03-05 ENCOUNTER — Ambulatory Visit (HOSPITAL_COMMUNITY)
Admission: RE | Admit: 2018-03-05 | Discharge: 2018-03-05 | Disposition: A | Discharge status: Home / Self Care | Payer: 59 | Source: Ambulatory Visit | Attending: Gastroenterology | Admitting: Gastroenterology

## 2018-03-05 DIAGNOSIS — K5090 Crohn's disease, unspecified, without complications: Secondary | ICD-10-CM | POA: Diagnosis not present

## 2018-03-05 LAB — COMPREHENSIVE METABOLIC PANEL
ALT: 34 U/L (ref 17–63)
AST: 27 U/L (ref 15–41)
Albumin: 4 g/dL (ref 3.5–5.0)
Alkaline Phosphatase: 79 U/L (ref 38–126)
Anion gap: 7 (ref 5–15)
BUN: 14 mg/dL (ref 6–20)
CHLORIDE: 108 mmol/L (ref 101–111)
CO2: 26 mmol/L (ref 22–32)
CREATININE: 1.15 mg/dL (ref 0.61–1.24)
Calcium: 9.5 mg/dL (ref 8.9–10.3)
Glucose, Bld: 112 mg/dL — ABNORMAL HIGH (ref 65–99)
Potassium: 4.1 mmol/L (ref 3.5–5.1)
Sodium: 141 mmol/L (ref 135–145)
Total Bilirubin: 0.7 mg/dL (ref 0.3–1.2)
Total Protein: 6.8 g/dL (ref 6.5–8.1)

## 2018-03-05 LAB — CBC WITH DIFFERENTIAL/PLATELET
ABS IMMATURE GRANULOCYTES: 0 10*3/uL (ref 0.0–0.1)
BASOS ABS: 0.1 10*3/uL (ref 0.0–0.1)
BASOS PCT: 1 %
EOS ABS: 0.2 10*3/uL (ref 0.0–0.7)
Eosinophils Relative: 3 %
HCT: 45 % (ref 39.0–52.0)
Hemoglobin: 14.4 g/dL (ref 13.0–17.0)
IMMATURE GRANULOCYTES: 1 %
Lymphocytes Relative: 27 %
Lymphs Abs: 1.6 10*3/uL (ref 0.7–4.0)
MCH: 28.1 pg (ref 26.0–34.0)
MCHC: 32 g/dL (ref 30.0–36.0)
MCV: 87.9 fL (ref 78.0–100.0)
Monocytes Absolute: 0.6 10*3/uL (ref 0.1–1.0)
Monocytes Relative: 11 %
NEUTROS ABS: 3.3 10*3/uL (ref 1.7–7.7)
NEUTROS PCT: 57 %
PLATELETS: 210 10*3/uL (ref 150–400)
RBC: 5.12 MIL/uL (ref 4.22–5.81)
RDW: 13.2 % (ref 11.5–15.5)
WBC: 5.8 10*3/uL (ref 4.0–10.5)

## 2018-03-05 MED ORDER — ACETAMINOPHEN 325 MG PO TABS
650.0000 mg | ORAL_TABLET | ORAL | Status: DC
  Administered 2018-03-05: 650 mg via ORAL

## 2018-03-05 MED ORDER — ACETAMINOPHEN 325 MG PO TABS
ORAL_TABLET | ORAL | Status: AC
  Administered 2018-03-05: 650 mg via ORAL
  Filled 2018-03-05: qty 2

## 2018-03-05 MED ORDER — SODIUM CHLORIDE 0.9 % IV SOLN
7.5000 mg/kg | INTRAVENOUS | Status: DC
  Administered 2018-03-05: 700 mg via INTRAVENOUS
  Filled 2018-03-05 (×2): qty 70

## 2018-03-05 MED ORDER — METHYLPREDNISOLONE SODIUM SUCC 40 MG IJ SOLR
INTRAMUSCULAR | Status: AC
  Administered 2018-03-05: 40 mg via INTRAVENOUS
  Filled 2018-03-05: qty 1

## 2018-03-05 MED ORDER — DIPHENHYDRAMINE HCL 50 MG/ML IJ SOLN
50.0000 mg | INTRAMUSCULAR | Status: DC
  Administered 2018-03-05: 50 mg via INTRAVENOUS

## 2018-03-05 MED ORDER — DIPHENHYDRAMINE HCL 50 MG/ML IJ SOLN
INTRAMUSCULAR | Status: AC
  Administered 2018-03-05: 50 mg via INTRAVENOUS
  Filled 2018-03-05: qty 1

## 2018-03-05 MED ORDER — METHYLPREDNISOLONE SODIUM SUCC 40 MG IJ SOLR
40.0000 mg | INTRAMUSCULAR | Status: DC
Start: 2018-03-05 — End: 2018-03-06
  Administered 2018-03-05: 40 mg via INTRAVENOUS

## 2018-03-17 ENCOUNTER — Other Ambulatory Visit: Payer: 59

## 2018-03-19 ENCOUNTER — Other Ambulatory Visit: Payer: 59 | Admitting: *Deleted

## 2018-03-19 ENCOUNTER — Ambulatory Visit (INDEPENDENT_AMBULATORY_CARE_PROVIDER_SITE_OTHER): Payer: 59 | Admitting: Family Medicine

## 2018-03-19 ENCOUNTER — Encounter: Payer: Self-pay | Admitting: Family Medicine

## 2018-03-19 VITALS — BP 132/82 | HR 77 | Temp 98.7°F | Ht 68.0 in | Wt 206.4 lb

## 2018-03-19 DIAGNOSIS — M7711 Lateral epicondylitis, right elbow: Secondary | ICD-10-CM

## 2018-03-19 DIAGNOSIS — E7849 Other hyperlipidemia: Secondary | ICD-10-CM

## 2018-03-19 LAB — HEPATIC FUNCTION PANEL
ALT: 32 IU/L (ref 0–44)
AST: 21 IU/L (ref 0–40)
Albumin: 4.2 g/dL (ref 3.5–5.5)
Alkaline Phosphatase: 69 IU/L (ref 39–117)
Bilirubin Total: 0.3 mg/dL (ref 0.0–1.2)
Bilirubin, Direct: 0.08 mg/dL (ref 0.00–0.40)
Total Protein: 6.6 g/dL (ref 6.0–8.5)

## 2018-03-19 LAB — LIPID PANEL
CHOL/HDL RATIO: 5 ratio (ref 0.0–5.0)
Cholesterol, Total: 180 mg/dL (ref 100–199)
HDL: 36 mg/dL — ABNORMAL LOW (ref >39)
LDL CALC: 112 mg/dL — AB (ref 0–99)
Triglycerides: 161 mg/dL — ABNORMAL HIGH (ref 0–149)
VLDL CHOLESTEROL CAL: 32 mg/dL (ref 5–40)

## 2018-03-19 NOTE — Progress Notes (Signed)
Musculoskeletal Exam  Patient: Jose [name] DOB: 10/30/1970  DOS: 03/19/2018  SUBJECTIVE:  Chief Complaint:   Chief Complaint  Patient presents with  . Arm Pain    Jose [name] is a 47 y.o.  male for evaluation and treatment of R arm pain.   Onset:  3 days ago.  Was carrying heavy luggage and not sure if that was it.  Location: R forearm/elbow Character:  aching and sharp  Progression of issue:  is unchanged Associated symptoms: pain with activities Treatment: to date has been OTC NSAIDS.   Neurovascular symptoms: no  ROS: Musculoskeletal/Extremities: +R elbow pain  Past Medical History:  Diagnosis Date  . Crohn's disease ([place])   . Hyperlipidemia   . Thrombophlebitis     Objective: VITAL SIGNS: BP 132/82 (BP Location: Left Arm, Patient Position: Sitting, Cuff Size: Normal)   Pulse 77   Temp 98.7 F (37.1 C) (Oral)   Ht 5' 8"  (1.727 m)   Wt 206 lb 6 oz (93.6 kg)   SpO2 96%   BMI 31.38 kg/m  Constitutional: Well formed, well developed. No acute distress. Cardiovascular: Brisk cap refill. RRR Thorax & Lungs: No accessory muscle use, CTAB Musculoskeletal: R elbow.   Normal active range of motion: yes.   Normal passive range of motion: yes Tenderness to palpation: Yes over prox forearm and lat epicondyle Deformity: no Ecchymosis: no +pain with resisted wrist extension Neurologic: Normal sensory function. No focal deficits noted. Psychiatric: Normal mood. Age appropriate judgment and insight. Alert & oriented x 3.    Assessment:  Lateral epicondylitis of right elbow  Plan: Tylenol.  Patient is on Xarelto.  He should avoid NSAIDs.  Stretches/exercises.  Wrist splint given as I do believe he is more forearm musculature involvement as well.  Ice. F/u prn. The patient voiced understanding and agreement to the plan.   [place], DO 03/19/18  9:56 AM

## 2018-03-19 NOTE — Progress Notes (Signed)
Pre visit review using our clinic review tool, if applicable. No additional management support is needed unless otherwise documented below in the visit note. 

## 2018-03-19 NOTE — Patient Instructions (Addendum)
Try to avoid NSAIDs.  Ice/cold pack over area for 10-15 min twice daily.  OK to take Tylenol 1000 mg (2 extra strength tabs) or 975 mg (3 regular strength tabs) every 6 hours as needed.  Wear the wrist brace at night and during times when you are doing things that use the wrist.  Around 3 times per week, check your blood pressure 4 times per day. Twice in the morning and twice in the evening. The readings should be at least one minute apart. Write down these values and bring them to your next nurse visit/appointment.  When you check your BP, make sure you have been doing something calm/relaxing 5 minutes prior to checking. Both feet should be flat on the floor and you should be sitting. Use your left arm and make sure it is in a relaxed position (on a table), and that the cuff is at the approximate level/height of your heart.   Your blood pressure goal is less than 140 on the top and less than 90 on the bottom.   Let us know if you need anything.   Elbow and Forearm Exercises It is normal to feel mild stretching, pulling, tightness, or discomfort as you do these exercises, but you should stop right away if you feel sudden pain or your pain gets worse. RANGE OF MOTION EXERCISES These exercises warm up your muscles and joints and improve the movement and flexibility of your injured elbow and forearm. These exercises also help to relieve pain, numbness, and tingling.These exercises are done using the muscles in your injured elbow and forearm. Exercise A: Elbow Flexion, Active 1. Hold your left / right arm at your side, and bend your elbow as far as you can using your left / right arm muscles. 2. Hold this position for 30 seconds. 3. Slowly return to the starting position. Repeat 2 times. Complete this exercise 3 times per week. Exercise B: Elbow Extension, Active 1. Hold your left / right arm at your side, and straighten your elbow as much as you can using your left / right arm  muscles. 2. Hold this position for 30 seconds. 3. Slowly return to the starting position. Repeat 2 times. Complete this exercise 3 times per week. Exercise C: Forearm Rotation, Supination, Active 1. Stand or sit with your elbows at your sides. 2. Bend your left / right elbow to an "L" shape (90 degrees). 3. Turn your palm upward until you feel a gentle stretch on the inside of your forearm. 4. Hold this position for 30 seconds. 5. Slowly release and return to the starting position. Repeat 2 times. Complete this exercise 3 times per week. Exercise D: Forearm Rotation, Pronation, Active 1. Stand or sit with your elbows at your side. 2. Bend your left / right elbow to an "L" shape (90 degrees). 3. Turn your left / right palm downward until you feel a gentle stretch on the top of your forearm. 4. Hold this position for 30 seconds. 5. Slowly release and return to the starting position. Repeat2 times. Complete this exercise 3 times per week. STRETCHING EXERCISES These exercises warm up your muscles and joints and improve the movement and flexibility of your injured elbow and forearm. These exercises also help to relieve pain, numbness, and tingling.These exercises are done using your healthy elbow and forearm to help stretch the muscles in your injured elbow and forearm. Exercise E: Elbow Flexion, Active-Assisted  1. Hold your left / right arm at your side, and bend  your elbow as much as you can using your left / right arm muscles. 2. Use your other hand to bend your left / right elbow farther. To do this, gently push up on your forearm until you feel a gentle stretch on the back of your elbow. 3. Hold this position for 30 seconds. 4. Slowly return to the starting position. Repeat 2 times. Complete this exercise 3 times per week. Exercise F: Elbow Extension, Active-Assisted  1. Hold your left / right arm at your side, and straighten your elbow as much as you can using your left / right arm  muscles. 2. Use your other hand to straighten the left / right elbow farther. To do this, gently push down on your forearm until you feel a gentle stretch on the inside of your elbow. 3. Hold this position for 30 seconds. 4. Slowly return to the starting position. Repeat 2 times. Complete this exercise 3 times per weeky. Exercise G: Forearm Rotation, Supination, Active-Assisted  1. Sit with your left / right elbow bent in an "L" shape (90 degrees) with your forearm resting on a table. 2. Keeping your upper body and shoulder still, rotate your forearm so your left / right palm faces upward. 3. Use your other hand to help rotate your forearm further until you feel a gentle to moderate stretch. 4. Hold this position for 30 seconds. 5. Slowly release the stretch and return to the starting position. Repeat 2 times. Complete this exercise 3 times per week. Exercise H: Forearm Rotation, Pronation, Active-Assisted  1. Sit with your left / right elbow bent in an "L" shape (90 degrees) with your forearm resting on a table. 2. Keeping your upper body and shoulder still, rotate your forearm so your palm faces the tabletop. 3. Use your other hand to help rotate your forearm further until you feel a gentle to moderate stretch. 4. Hold this position for 30 seconds. 5. Slowly release the stretch and return to the starting position. Repeat 2 times. Complete this exercise 3 times per week. Exercise I: Elbow Flexion, Supine, Passive 1. Lie on your back. 2. Extend your left / right arm up in the air, bracing it with your other hand. 3. Let your left / right your hand slowly lower toward your shoulder, while your elbow stays pointed toward the ceiling. You should feel a gentle stretch along the back of your upper arm and elbow. 4. If instructed by your health care provider, you may increase the intensity of your stretch by adding a small wrist weight or hand weight. 5. Hold this position for 3  seconds. 6. Slowly return to the starting position. Repeat 2 times. Complete this exercise 3 times per week. Exercise J: Elbow Extension, Supine, Passive  1. Lie on your back. Make sure that you are in a comfortable position that lets you relax your arm muscles. 2. Place a folded towel under your left / right upper arm so your elbow and shoulder are at the same height. Straighten your left / right arm so your elbow does not rest on the bed or towel. 3. Let the weight of your hand stretch your elbow. Keep your arm and chest muscles relaxed. You should feel a stretch on the inside of your elbow. 4. If told by your health care provider, you may increase the intensity of your stretch by adding a small wrist weight or hand weight. 5. Hold this position for 30 seconds. 6. Slowly release the stretch. Repeat 2 times.  Complete this exercise 3 times per week. STRENGTHENING EXERCISES These exercises build strength and endurance in your elbow and forearm. Endurance is the ability to use your muscles for a long time, even after they get tired. Exercise K: Elbow Flexion, Isometric  1. Stand or sit up straight. 2. Bend your left / right elbow in an "L" shape (90 degrees) and turn your palm up so your forearm is at the height of your waist. 3. Place your other hand on top of your forearm. Gently push down as your left / right arm resists. Push as hard as you can with both arms without causing any pain or movement at your left / right elbow. 4. Hold this position for 3 seconds. 5. Slowly release the tension in both arms. Let your muscles relax completely before repeating. Repeat 2 times. Complete this exercise 3 times per week. Exercise L: Elbow Extensors, Isometric  1. Stand or sit up straight. 2. Place your left / right arm so your palm faces your abdomen and it is at the height of your waist. 3. Place your other hand on the underside of your forearm. Gently push up as your left / right arm resists. Push  as hard as you can with both arms, without causing any pain or movement at your left / right elbow. 4. Hold this position for 3 seconds. 5. Slowly release the tension in both arms. Let your muscles relax completely before repeating. Repeat _______2___ times. Complete this exercise 3 times per week. Exercise M: Elbow Flexion With Forearm Palm Up  1. Sit upright on a firm chair without armrests, or stand. 2. Place your left / right arm at your side with your palm facing forward. 3. Holding a 5 lbweight or gripping a rubber exercise band or tubing, bend your elbow to bring your hand toward your shoulder. 4. Hold this position for 3 seconds. 5. Slowly return to the starting position. Repeat 2 times. Complete this exercise 3 times per week. Exercise N: Elbow Extension  1. Sit on a firm chair without armrests, or stand. 2. Keeping your upper arms at your sides, bring both hands up toward your left / right shoulder while you grip a rubber exercise band or tubing. Your left / right hand should be just below the other hand. 3. Straighten your left / right elbow. 4. Hold this position for 3 seconds. 5. Control the resistance of the band or tubing as your hand returns to your side. Repeat 2 times. Complete this exercise 3 times per week. Exercise O: Forearm Rotation, Supination  1. Sit with your left / right forearm supported on a table. Keep your elbow at waist height. 2. Rest your hand over the edge of the table with your palm facing down. 3. Gently hold a lightweight hammer. 4. Without moving your elbow, slowly rotate your forearm to turn your palm and hand upward to a "thumbs-up" position. 5. Hold this position for 3 seconds. 6. Slowly return to the starting position. Repeat 2 times. Complete this exercise 3 times per week. Exercise P: Forearm Rotation, Pronation  1. Sit with your left / right forearm supported on a table. Keep your elbow below shoulder height. 2. Rest your hand over the  edge of the table with your palm facing up. 3. Gently hold a lightweight hammer. 4. Without moving your elbow, slowly rotate your forearm to turn your palm and hand upward to a "thumbs-up" position. 5. Hold this position for 3 seconds. 6. Slowly return  to the starting position. Repeat 2 times. Complete this exercise 3 times per week.  Make sure you discuss any questions you have with your health care provider. Document Released: 07/16/2005 Document Revised: 01/10/2016 Document Reviewed: 05/27/2015 Elsevier Interactive Patient Education  [employer].

## 2018-04-17 ENCOUNTER — Encounter: Payer: Self-pay | Admitting: Oncology

## 2018-04-20 ENCOUNTER — Other Ambulatory Visit: Payer: Self-pay | Admitting: *Deleted

## 2018-04-20 MED ORDER — RIVAROXABAN 20 MG PO TABS: ORAL_TABLET | ORAL | 1 refills | Status: DC

## 2018-04-21 ENCOUNTER — Encounter: Payer: Self-pay | Admitting: Family Medicine

## 2018-04-21 ENCOUNTER — Ambulatory Visit: Payer: Self-pay | Admitting: *Deleted

## 2018-04-21 ENCOUNTER — Ambulatory Visit (INDEPENDENT_AMBULATORY_CARE_PROVIDER_SITE_OTHER): Payer: 59 | Admitting: Family Medicine

## 2018-04-21 VITALS — BP 110/72 | HR 78 | Temp 98.2°F | Ht 68.0 in | Wt 201.4 lb

## 2018-04-21 DIAGNOSIS — S46811A Strain of other muscles, fascia and tendons at shoulder and upper arm level, right arm, initial encounter: Secondary | ICD-10-CM | POA: Diagnosis not present

## 2018-04-21 DIAGNOSIS — R42 Dizziness and giddiness: Secondary | ICD-10-CM | POA: Diagnosis not present

## 2018-04-21 NOTE — Progress Notes (Signed)
Chief Complaint  Patient presents with  . right shoulder and neck pain    Subjective: Patient is a 47 y.o. male here for R shoulder and neck pain.  2 d ago, started having R sided neck/shoulder pain. Pain started to radiate further down back and this AM, noticed on side and further down RUE. Tylenol and Advil helped a little. This AM, he has been a little lightheaded and has been having sweating over past couple days without good reason. Heat helps a little also. This resembles PE minus sob. He is on Xarelto for DVT, has been compliant. No recent injr, change in activity, surg, prolonged bedrest, recent calf pain.   ROS: Heart: Denies chest pain  Lungs: Denies SOB   Past Medical History:  Diagnosis Date  . Crohn's disease ([place])   . Hyperlipidemia   . Thrombophlebitis     Objective: BP 110/72 (BP Location: Left Arm, Patient Position: Sitting, Cuff Size: Normal)   Pulse 78   Temp 98.2 F (36.8 C) (Oral)   Ht 5' 8"  (1.727 m)   Wt 201 lb 6 oz (91.3 kg)   SpO2 96%   BMI 30.62 kg/m  General: Awake, appears stated age MSK: +TTP over R trap Neuro: No cerebellar signs, DTR's equal and symmetric in UE's.  Heart: RRR, no murmurs Lungs: CTAB, no rales, wheezes or rhonchi. No accessory muscle use  Psych: Age appropriate judgment and insight, normal affect and mood  Assessment and Plan: Strain of right trapezius muscle, initial encounter  Lightheadedness - Plan: EKG 12-Lead  Orders as above. EKG neg. Not suggestive of ischemia or R heart overload. -2 [place].  Heat, ice, stretches/exercises, Tylenol.  F/u prn.  The patient voiced understanding and agreement to the plan.  [place], DO 04/22/18  4:25 PM

## 2018-04-21 NOTE — Patient Instructions (Signed)
Heat (pad or rice pillow in microwave) over affected area, 10-15 minutes twice daily.   OK to take Tylenol 1000 mg (2 extra strength tabs) or 975 mg (3 regular strength tabs) every 6 hours as needed.  Trapezius stretches/exercises Do exercises exactly as told by your health care provider and adjust them as directed. It is normal to feel mild stretching, pulling, tightness, or discomfort as you do these exercises, but you should stop right away if you feel sudden pain or your pain gets worse.  Stretching and range of motion exercises These exercises warm up your muscles and joints and improve the movement and flexibility of your shoulder. These exercises can also help to relieve pain, numbness, and tingling. If you are unable to do any of the following for any reason, do not further attempt to do it.   Exercise A: Flexion, standing    1. Stand and hold a broomstick, a cane, or a similar object. Place your hands a little more than shoulder-width apart on the object. Your left / right hand should be palm-up, and your other hand should be palm-down. 2. Push the stick to raise your left / right arm out to your side and then over your head. Use your other hand to help move the stick. Stop when you feel a stretch in your shoulder, or when you reach the angle that is recommended by your health care provider. ? Avoid shrugging your shoulder while you raise your arm. Keep your shoulder blade tucked down toward your spine. 3. Hold for 30 seconds. 4. Slowly return to the starting position. Repeat 2 times. Complete this exercise 3 times per week.  Exercise B: Abduction, supine    1. Lie on your back and hold a broomstick, a cane, or a similar object. Place your hands a little more than shoulder-width apart on the object. Your left / right hand should be palm-up, and your other hand should be palm-down. 2. Push the stick to raise your left / right arm out to your side and then over your head. Use your  other hand to help move the stick. Stop when you feel a stretch in your shoulder, or when you reach the angle that is recommended by your health care provider. ? Avoid shrugging your shoulder while you raise your arm. Keep your shoulder blade tucked down toward your spine. 3. Hold for 30 seconds. 4. Slowly return to the starting position. Repeat 2 times. Complete this exercise 3 times per week.  Exercise C: Flexion, active-assisted    1. Lie on your back. You may bend your knees for comfort. 2. Hold a broomstick, a cane, or a similar object. Place your hands about shoulder-width apart on the object. Your palms should face toward your feet. 3. Raise the stick and move your arms over your head and behind your head, toward the floor. Use your healthy arm to help your left / right arm move farther. Stop when you feel a gentle stretch in your shoulder, or when you reach the angle where your health care provider tells you to stop. 4. Hold for 30 seconds. 5. Slowly return to the starting position. Repeat 2 times. Complete this exercise 3 times per week.  Exercise D: External rotation and abduction    1. Stand in a door frame with one of your feet slightly in front of the other. This is called a staggered stance. 2. Choose one of the following positions as told by your health care provider: ?  Place your hands and forearms on the door frame above your head. ? Place your hands and forearms on the door frame at the height of your head. ? Place your hands on the door frame at the height of your elbows. 3. Slowly move your weight onto your front foot until you feel a stretch across your chest and in the front of your shoulders. Keep your head and chest upright and keep your abdominal muscles tight. 4. Hold for 30 seconds. 5. To release the stretch, shift your weight to your back foot. Repeat 2 times. Complete this stretch 3 times per week.  Strengthening exercises These exercises build strength  and endurance in your shoulder. Endurance is the ability to use your muscles for a long time, even after your muscles get tired. Exercise E: Scapular depression and adduction  1. Sit on a stable chair. Support your arms in front of you with pillows, armrests, or a tabletop. Keep your elbows in line with the sides of your body. 2. Gently move your shoulder blades down toward your middle back. Relax the muscles on the tops of your shoulders and in the back of your neck. 3. Hold for 3 seconds. 4. Slowly release the tension and relax your muscles completely before doing this exercise again. Repeat for a total of 10 repetitions. 5. After you have practiced this exercise, try doing the exercise without the arm support. Then, try the exercise while standing instead of sitting. Repeat 2 times. Complete this exercise 3 times per week.  Exercise F: Shoulder abduction, isometric    1. Stand or sit about 4-6 inches (10-15 cm) from a wall with your left / right side facing the wall. 2. Bend your left / right elbow and gently press your elbow against the wall. 3. Increase the pressure slowly until you are pressing as hard as you can without shrugging your shoulder. 4. Hold for 3 seconds. 5. Slowly release the tension and relax your muscles completely. Repeat for a total of 10 repetitions. Repeat 2 times. Complete this exercise 3 times per week.  Exercise G: Shoulder flexion, isometric    1. Stand or sit about 4-6 inches (10-15 cm) away from a wall with your left / right side facing the wall. 2. Keep your left / right elbow straight and gently press the top of your fist against the wall. Increase the pressure slowly until you are pressing as hard as you can without shrugging your shoulder. 3. Hold for 10-15 seconds. 4. Slowly release the tension and relax your muscles completely. Repeat for a total of 10 repetitions. Repeat 2 times. Complete this exercise 3 times per week.  Exercise H: Internal  rotation    1. Sit in a stable chair without armrests, or stand. Secure an exercise band at your left / right side, at elbow height. 2. Place a soft object, such as a folded towel or a small pillow, under your left / right upper arm so your elbow is a few inches (about 8 cm) away from your side. 3. Hold the end of the exercise band so the band stretches. 4. Keeping your elbow pressed against the soft object under your arm, move your forearm across your body toward your abdomen. Keep your body steady so the movement is only coming from your shoulder. 5. Hold for 3 seconds. 6. Slowly return to the starting position. Repeat for a total of 10 repetitions. Repeat 2 times. Complete this exercise 3 times per week.  Exercise I:  External rotation    1. Sit in a stable chair without armrests, or stand. 2. Secure an exercise band at your left / right side, at elbow height. 3. Place a soft object, such as a folded towel or a small pillow, under your left / right upper arm so your elbow is a few inches (about 8 cm) away from your side. 4. Hold the end of the exercise band so the band stretches. 5. Keeping your elbow pressed against the soft object under your arm, move your forearm out, away from your abdomen. Keep your body steady so the movement is only coming from your shoulder. 6. Hold for 3 seconds. 7. Slowly return to the starting position. Repeat for a total of 10 repetitions. Repeat 2 times. Complete this exercise 3 times per week. Exercise J: Shoulder extension  1. Sit in a stable chair without armrests, or stand. Secure an exercise band to a stable object in front of you so the band is at shoulder height. 2. Hold one end of the exercise band in each hand. Your palms should face each other. 3. Straighten your elbows and lift your hands up to shoulder height. 4. Step back, away from the secured end of the exercise band, until the band stretches. 5. Squeeze your shoulder blades together and  pull your hands down to the sides of your thighs. Stop when your hands are straight down by your sides. Do not let your hands go behind your body. 6. Hold for 3 seconds. 7. Slowly return to the starting position. Repeat for a total of 10 repetitions. Repeat 2 times. Complete this exercise 3 times per week.  Exercise K: Shoulder extension, prone    1. Lie on your abdomen on a firm surface so your left / right arm hangs over the edge. 2. Hold a 5 lb weight in your hand so your palm faces in toward your body. Your arm should be straight. 3. Squeeze your shoulder blade down toward the middle of your back. 4. Slowly raise your arm behind you, up to the height of the surface that you are lying on. Keep your arm straight. 5. Hold for 3 seconds. 6. Slowly return to the starting position and relax your muscles. Repeat for a total of 10 repetitions. Repeat 2 times. Complete this exercise 3 times per week.   Exercise L: Horizontal abduction, prone  1. Lie on your abdomen on a firm surface so your left / right arm hangs over the edge. 2. Hold a 5 lb weight in your hand so your palm faces toward your feet. Your arm should be straight. 3. Squeeze your shoulder blade down toward the middle of your back. 4. Bend your elbow so your hand moves up, until your elbow is bent to an "L" shape (90 degrees). With your elbow bent, slowly move your forearm forward and up. Raise your hand up to the height of the surface that you are lying on. ? Your upper arm should not move, and your elbow should stay bent. ? At the top of the movement, your palm should face the floor. 5. Hold for 3 seconds. 6. Slowly return to the starting position and relax your muscles. Repeat for a total of 10 repetitions. Repeat 2 times. Complete this exercise 3 times per week.  Exercise M: Horizontal abduction, standing  1. Sit on a stable chair, or stand. 2. Secure an exercise band to a stable object in front of you so the band is at  shoulder height. 3. Hold one end of the exercise band in each hand. 4. Straighten your elbows and lift your hands straight in front of you, up to shoulder height. Your palms should face down, toward the floor. 5. Step back, away from the secured end of the exercise band, until the band stretches. 6. Move your arms out to your sides, and keep your arms straight. 7. Hold for 3 seconds. 8. Slowly return to the starting position. Repeat for a total of 10 repetitions. Repeat 2 times. Complete this exercise 3 times per week.  Exercise N: Scapular retraction and elevation  1. Sit on a stable chair, or stand. 2. Secure an exercise band to a stable object in front of you so the band is at shoulder height. 3. Hold one end of the exercise band in each hand. Your palms should face each other. 4. Sit in a stable chair without armrests, or stand. 5. Step back, away from the secured end of the exercise band, until the band stretches. 6. Squeeze your shoulder blades together and lift your hands over your head. Keep your elbows straight. 7. Hold for 3 seconds. 8. Slowly return to the starting position. Repeat for a total of 10 repetitions. Repeat 2 times. Complete this exercise 3 times per week.  This information is not intended to replace advice given to you by your health care provider. Make sure you discuss any questions you have with your health care provider. Document Released: 09/01/2005 Document Revised: 05/08/2016 Document Reviewed: 07/19/2015 Elsevier Interactive Patient Education  2017 [employer].

## 2018-04-21 NOTE — Telephone Encounter (Signed)
Pt called with complaints of right neck, shoulder, back and underarm pits and intermittent light headed (last episode this morning); he says that these similar to when he had a blood clot; the pt says that some movements make it worse (when turns his neck or moves his hand a certain way); he also states that it could be because of a pulled muscle (he cut grass within the past few days) or he could have slept wrong; these symptoms have been going on for 1.5-2 days; ibuprofen does help with the pain; the pt does take xarelto as prescribed; recommendations made per nurse triage protocol; spoke with Paticia Stack, per Dr Nani Ravens pt is offered and accepts appointment today at 1530; he verbalizes understanding; will also route to office for notification of this upcoming appointment.  Reason for Disposition . [1] MODERATE pain (e.g., interferes with normal activities) AND [2] present > 3 days  Answer Assessment - Initial Assessment Questions 1. ONSET: "When did the pain start?"     04/19/18 2. LOCATION: "Where is the pain located?"     Right neck, shoulder,  3. PAIN: "How bad is the pain?" (Scale 1-10; or mild, moderate, severe)   - MILD (1-3): doesn't interfere with normal activities   - MODERATE (4-7): interferes with normal activities (e.g., work or school) or awakens from sleep   - SEVERE (8-10): excruciating pain, unable to do any normal activities, unable to move arm at all due to pain     mild 4. WORK OR EXERCISE: "Has there been any recent work or exercise that involved this part of the body?"     unsure 5. CAUSE: "What do you think is causing the shoulder pain?"     unsure 6. OTHER SYMPTOMS: "Do you have any other symptoms?" (e.g., neck pain, swelling, rash, fever, numbness, weakness)     Neck, back and underarm pits, intermittent light headed (last episode this morning)  7. PREGNANCY: "Is there any chance you are pregnant?" "When was your last menstrual period?"     n/a  Protocols  used: SHOULDER PAIN-A-AH

## 2018-04-21 NOTE — Progress Notes (Signed)
Pre visit review using our clinic review tool, if applicable. No additional management support is needed unless otherwise documented below in the visit note. 

## 2018-04-29 ENCOUNTER — Other Ambulatory Visit (HOSPITAL_COMMUNITY): Payer: Self-pay

## 2018-04-30 ENCOUNTER — Ambulatory Visit (HOSPITAL_COMMUNITY)
Admission: RE | Admit: 2018-04-30 | Discharge: 2018-04-30 | Disposition: A | Discharge status: Home / Self Care | Payer: 59 | Source: Ambulatory Visit | Attending: Gastroenterology | Admitting: Gastroenterology

## 2018-04-30 DIAGNOSIS — K5090 Crohn's disease, unspecified, without complications: Secondary | ICD-10-CM | POA: Insufficient documentation

## 2018-04-30 MED ORDER — DIPHENHYDRAMINE HCL 50 MG/ML IJ SOLN
50.0000 mg | Freq: Once | INTRAMUSCULAR | Status: AC
  Administered 2018-04-30: 50 mg via INTRAVENOUS

## 2018-04-30 MED ORDER — DIPHENHYDRAMINE HCL 50 MG/ML IJ SOLN
INTRAMUSCULAR | Status: AC
  Administered 2018-04-30: 50 mg via INTRAVENOUS
  Filled 2018-04-30: qty 1

## 2018-04-30 MED ORDER — METHYLPREDNISOLONE SODIUM SUCC 40 MG IJ SOLR
40.0000 mg | Freq: Once | INTRAMUSCULAR | Status: AC
  Administered 2018-04-30: 40 mg via INTRAVENOUS

## 2018-04-30 MED ORDER — ACETAMINOPHEN 325 MG PO TABS
ORAL_TABLET | ORAL | Status: AC
  Administered 2018-04-30: 650 mg via ORAL
  Filled 2018-04-30: qty 2

## 2018-04-30 MED ORDER — ACETAMINOPHEN 325 MG PO TABS
650.0000 mg | ORAL_TABLET | Freq: Once | ORAL | Status: AC
  Administered 2018-04-30: 650 mg via ORAL

## 2018-04-30 MED ORDER — METHYLPREDNISOLONE SODIUM SUCC 40 MG IJ SOLR
INTRAMUSCULAR | Status: AC
  Administered 2018-04-30: 40 mg via INTRAVENOUS
  Filled 2018-04-30: qty 1

## 2018-04-30 MED ORDER — SODIUM CHLORIDE 0.9 % IV SOLN
7.5000 mg/kg | INTRAVENOUS | Status: DC
  Administered 2018-04-30: 700 mg via INTRAVENOUS
  Filled 2018-04-30: qty 70

## 2018-06-09 ENCOUNTER — Ambulatory Visit (INDEPENDENT_AMBULATORY_CARE_PROVIDER_SITE_OTHER): Payer: 59 | Admitting: Family Medicine

## 2018-06-09 ENCOUNTER — Encounter: Payer: Self-pay | Admitting: Family Medicine

## 2018-06-09 VITALS — BP 110/70 | HR 85 | Temp 98.3°F | Resp 16 | Wt 201.0 lb

## 2018-06-09 DIAGNOSIS — K5010 Crohn's disease of large intestine without complications: Secondary | ICD-10-CM | POA: Diagnosis not present

## 2018-06-09 DIAGNOSIS — M7711 Lateral epicondylitis, right elbow: Secondary | ICD-10-CM | POA: Diagnosis not present

## 2018-06-09 MED ORDER — METHYLPREDNISOLONE ACETATE 40 MG/ML IJ SUSP: 20.0000 mg | Freq: Once | INTRAMUSCULAR | Status: DC

## 2018-06-09 NOTE — Progress Notes (Signed)
Musculoskeletal Exam  Patient: Jose [name] DOB: 1971-08-10  DOS: 06/09/2018  SUBJECTIVE:  Chief Complaint:   Chief Complaint  Patient presents with  . Joint Swelling    on going issue. has gotten worse recently    Jose [name] is a 47 y.o.  male for f/u treatment of R elbow pain.   Has been doing some of the stretches/exercises and has been wearing forearm strap. Still having pain. When he uses his forearm, the pain is near debilitating. No new inj.  ROS: Musculoskeletal/Extremities: +R elbow pain  Past Medical History:  Diagnosis Date  . Crohn's disease ([place])   . Hyperlipidemia   . Thrombophlebitis     Objective: VITAL SIGNS: BP 110/70   Pulse 85   Temp 98.3 F (36.8 C) (Oral)   Resp 16   Wt 201 lb (91.2 kg)   SpO2 96%   BMI 30.56 kg/m  Constitutional: Well formed, well developed. No acute distress. Cardiovascular: Brisk cap refill Thorax & Lungs: No accessory muscle use Musculoskeletal: R elbow.   Normal active range of motion: yes.   Normal passive range of motion: yes Tenderness to palpation: yes, over lat epicond Deformity: no Ecchymosis: no Neurologic: Normal sensory function.  Psychiatric: Normal mood. Age appropriate judgment and insight. Alert & oriented x 3.    Procedure note: Lat epi injection Verbal consent obtained The area of interest was palpated and marked with an otoscope speculum It was cleaned with alcohol and freeze spray used for topical anesthesia. A 31 g needle was used to inject 20 mg Depo and 1 cc of 1% lido w/o epi A bandaid was placed. The pt tolerated well. No immediate complications noted   Assessment:  Lateral epicondylitis of right elbow - Plan: PR INJECT TENDON SHEATH/LIGAMENT  Plan: Wrist splint given to prevent wrist dorsiflexion. Wear at night. Ice, nsaids, Tylenol, stretches/exercises. F/u 3-4 weeks if no better and we will order PT. The patient voiced understanding and agreement to the  plan.   [place], DO 06/09/18  4:24 PM

## 2018-06-09 NOTE — Patient Instructions (Signed)
Give this 3-4 weeks. If no improvement to your satisfaction, send me a MyChart message and I will get you set up with physical therapy.   My hope is that the wrist splint will prevent your hand from bending forward and backwards. Wearing at night would be particularly helpful.   Ice/cold pack over area for 10-15 min twice daily.  Ibuprofen 400-600 mg (2-3 over the counter strength tabs) every 6 hours as needed for pain.  OK to take Tylenol 1000 mg (2 extra strength tabs) or 975 mg (3 regular strength tabs) every 6 hours as needed.   Elbow and Forearm Exercises It is normal to feel mild stretching, pulling, tightness, or discomfort as you do these exercises, but you should stop right away if you feel sudden pain or your pain gets worse. RANGE OF MOTION EXERCISES These exercises warm up your muscles and joints and improve the movement and flexibility of your injured elbow and forearm. These exercises also help to relieve pain, numbness, and tingling.These exercises are done using the muscles in your injured elbow and forearm. Exercise A: Elbow Flexion, Active 1. Hold your left / right arm at your side, and bend your elbow as far as you can using your left / right arm muscles. 2. Hold this position for 30 seconds. 3. Slowly return to the starting position. Repeat 2 times. Complete this exercise 3 times per week. Exercise B: Elbow Extension, Active 1. Hold your left / right arm at your side, and straighten your elbow as much as you can using your left / right arm muscles. 2. Hold this position for 30 seconds. 3. Slowly return to the starting position. Repeat 2 times. Complete this exercise 3 times per week. Exercise C: Forearm Rotation, Supination, Active 1. Stand or sit with your elbows at your sides. 2. Bend your left / right elbow to an "L" shape (90 degrees). 3. Turn your palm upward until you feel a gentle stretch on the inside of your forearm. 4. Hold this position for 30  seconds. 5. Slowly release and return to the starting position. Repeat 2 times. Complete this exercise 3 times per week. Exercise D: Forearm Rotation, Pronation, Active 1. Stand or sit with your elbows at your side. 2. Bend your left / right elbow to an "L" shape (90 degrees). 3. Turn your left / right palm downward until you feel a gentle stretch on the top of your forearm. 4. Hold this position for 30 seconds. 5. Slowly release and return to the starting position. Repeat2 times. Complete this exercise 3 times per week. STRETCHING EXERCISES These exercises warm up your muscles and joints and improve the movement and flexibility of your injured elbow and forearm. These exercises also help to relieve pain, numbness, and tingling.These exercises are done using your healthy elbow and forearm to help stretch the muscles in your injured elbow and forearm. Exercise E: Elbow Flexion, Active-Assisted  1. Hold your left / right arm at your side, and bend your elbow as much as you can using your left / right arm muscles. 2. Use your other hand to bend your left / right elbow farther. To do this, gently push up on your forearm until you feel a gentle stretch on the back of your elbow. 3. Hold this position for 30 seconds. 4. Slowly return to the starting position. Repeat 2 times. Complete this exercise 3 times per week. Exercise F: Elbow Extension, Active-Assisted  1. Hold your left / right arm at your side,  and straighten your elbow as much as you can using your left / right arm muscles. 2. Use your other hand to straighten the left / right elbow farther. To do this, gently push down on your forearm until you feel a gentle stretch on the inside of your elbow. 3. Hold this position for 30 seconds. 4. Slowly return to the starting position. Repeat 2 times. Complete this exercise 3 times per weeky. Exercise G: Forearm Rotation, Supination, Active-Assisted  1. Sit with your left / right elbow bent in  an "L" shape (90 degrees) with your forearm resting on a table. 2. Keeping your upper body and shoulder still, rotate your forearm so your left / right palm faces upward. 3. Use your other hand to help rotate your forearm further until you feel a gentle to moderate stretch. 4. Hold this position for 30 seconds. 5. Slowly release the stretch and return to the starting position. Repeat 2 times. Complete this exercise 3 times per week. Exercise H: Forearm Rotation, Pronation, Active-Assisted  1. Sit with your left / right elbow bent in an "L" shape (90 degrees) with your forearm resting on a table. 2. Keeping your upper body and shoulder still, rotate your forearm so your palm faces the tabletop. 3. Use your other hand to help rotate your forearm further until you feel a gentle to moderate stretch. 4. Hold this position for 30 seconds. 5. Slowly release the stretch and return to the starting position. Repeat 2 times. Complete this exercise 3 times per week. Exercise I: Elbow Flexion, Supine, Passive 1. Lie on your back. 2. Extend your left / right arm up in the air, bracing it with your other hand. 3. Let your left / right your hand slowly lower toward your shoulder, while your elbow stays pointed toward the ceiling. You should feel a gentle stretch along the back of your upper arm and elbow. 4. If instructed by your health care provider, you may increase the intensity of your stretch by adding a small wrist weight or hand weight. 5. Hold this position for 3 seconds. 6. Slowly return to the starting position. Repeat 2 times. Complete this exercise 3 times per week. Exercise J: Elbow Extension, Supine, Passive  1. Lie on your back. Make sure that you are in a comfortable position that lets you relax your arm muscles. 2. Place a folded towel under your left / right upper arm so your elbow and shoulder are at the same height. Straighten your left / right arm so your elbow does not rest on the bed  or towel. 3. Let the weight of your hand stretch your elbow. Keep your arm and chest muscles relaxed. You should feel a stretch on the inside of your elbow. 4. If told by your health care provider, you may increase the intensity of your stretch by adding a small wrist weight or hand weight. 5. Hold this position for 30 seconds. 6. Slowly release the stretch. Repeat 2 times. Complete this exercise 3 times per week. STRENGTHENING EXERCISES These exercises build strength and endurance in your elbow and forearm. Endurance is the ability to use your muscles for a long time, even after they get tired. Exercise K: Elbow Flexion, Isometric  1. Stand or sit up straight. 2. Bend your left / right elbow in an "L" shape (90 degrees) and turn your palm up so your forearm is at the height of your waist. 3. Place your other hand on top of your forearm. Gently  push down as your left / right arm resists. Push as hard as you can with both arms without causing any pain or movement at your left / right elbow. 4. Hold this position for 3 seconds. 5. Slowly release the tension in both arms. Let your muscles relax completely before repeating. Repeat 2 times. Complete this exercise 3 times per week. Exercise L: Elbow Extensors, Isometric  1. Stand or sit up straight. 2. Place your left / right arm so your palm faces your abdomen and it is at the height of your waist. 3. Place your other hand on the underside of your forearm. Gently push up as your left / right arm resists. Push as hard as you can with both arms, without causing any pain or movement at your left / right elbow. 4. Hold this position for 3 seconds. 5. Slowly release the tension in both arms. Let your muscles relax completely before repeating. Repeat _______2___ times. Complete this exercise 3 times per week. Exercise M: Elbow Flexion With Forearm Palm Up  1. Sit upright on a firm chair without armrests, or stand. 2. Place your left / right arm at  your side with your palm facing forward. 3. Holding a 5 lbweight or gripping a rubber exercise band or tubing, bend your elbow to bring your hand toward your shoulder. 4. Hold this position for 3 seconds. 5. Slowly return to the starting position. Repeat 2 times. Complete this exercise 3 times per week. Exercise N: Elbow Extension  1. Sit on a firm chair without armrests, or stand. 2. Keeping your upper arms at your sides, bring both hands up toward your left / right shoulder while you grip a rubber exercise band or tubing. Your left / right hand should be just below the other hand. 3. Straighten your left / right elbow. 4. Hold this position for 3 seconds. 5. Control the resistance of the band or tubing as your hand returns to your side. Repeat 2 times. Complete this exercise 3 times per week. Exercise O: Forearm Rotation, Supination  1. Sit with your left / right forearm supported on a table. Keep your elbow at waist height. 2. Rest your hand over the edge of the table with your palm facing down. 3. Gently hold a lightweight hammer. 4. Without moving your elbow, slowly rotate your forearm to turn your palm and hand upward to a "thumbs-up" position. 5. Hold this position for 3 seconds. 6. Slowly return to the starting position. Repeat 2 times. Complete this exercise 3 times per week. Exercise P: Forearm Rotation, Pronation  1. Sit with your left / right forearm supported on a table. Keep your elbow below shoulder height. 2. Rest your hand over the edge of the table with your palm facing up. 3. Gently hold a lightweight hammer. 4. Without moving your elbow, slowly rotate your forearm to turn your palm and hand upward to a "thumbs-up" position. 5. Hold this position for 3 seconds. 6. Slowly return to the starting position. Repeat 2 times. Complete this exercise 3 times per week.  Make sure you discuss any questions you have with your health care provider. Document Released: 07/16/2005  Document Revised: 01/10/2016 Document Reviewed: 05/27/2015 Elsevier Interactive Patient Education  [employer].

## 2018-06-09 NOTE — Addendum Note (Signed)
Addended by: Terence Lux B on: 06/09/2018 04:29 PM   Modules accepted: Orders

## 2018-06-25 ENCOUNTER — Encounter (HOSPITAL_COMMUNITY): Payer: 59

## 2018-06-27 ENCOUNTER — Encounter: Payer: Self-pay | Admitting: Family Medicine

## 2018-07-01 ENCOUNTER — Other Ambulatory Visit (HOSPITAL_COMMUNITY): Payer: Self-pay

## 2018-07-02 ENCOUNTER — Ambulatory Visit (HOSPITAL_COMMUNITY)
Admission: RE | Admit: 2018-07-02 | Discharge: 2018-07-02 | Disposition: A | Discharge status: Home / Self Care | Payer: 59 | Source: Ambulatory Visit | Attending: Gastroenterology | Admitting: Gastroenterology

## 2018-07-02 DIAGNOSIS — K5090 Crohn's disease, unspecified, without complications: Secondary | ICD-10-CM | POA: Diagnosis not present

## 2018-07-02 LAB — CBC WITH DIFFERENTIAL/PLATELET
Abs Immature Granulocytes: 0.02 10*3/uL (ref 0.00–0.07)
BASOS PCT: 1 %
Basophils Absolute: 0 10*3/uL (ref 0.0–0.1)
EOS ABS: 0.2 10*3/uL (ref 0.0–0.5)
EOS PCT: 4 %
HCT: 43.6 % (ref 39.0–52.0)
Hemoglobin: 14.1 g/dL (ref 13.0–17.0)
Immature Granulocytes: 0 %
Lymphocytes Relative: 26 %
Lymphs Abs: 1.4 10*3/uL (ref 0.7–4.0)
MCH: 28.2 pg (ref 26.0–34.0)
MCHC: 32.3 g/dL (ref 30.0–36.0)
MCV: 87.2 fL (ref 80.0–100.0)
Monocytes Absolute: 0.5 10*3/uL (ref 0.1–1.0)
Monocytes Relative: 10 %
Neutro Abs: 3.1 10*3/uL (ref 1.7–7.7)
Neutrophils Relative %: 59 %
PLATELETS: 206 10*3/uL (ref 150–400)
RBC: 5 MIL/uL (ref 4.22–5.81)
RDW: 12.8 % (ref 11.5–15.5)
WBC: 5.3 10*3/uL (ref 4.0–10.5)
nRBC: 0 % (ref 0.0–0.2)

## 2018-07-02 LAB — COMPREHENSIVE METABOLIC PANEL
ALT: 30 U/L (ref 0–44)
ANION GAP: 5 (ref 5–15)
AST: 27 U/L (ref 15–41)
Albumin: 3.8 g/dL (ref 3.5–5.0)
Alkaline Phosphatase: 58 U/L (ref 38–126)
BUN: 19 mg/dL (ref 6–20)
CHLORIDE: 110 mmol/L (ref 98–111)
CO2: 23 mmol/L (ref 22–32)
Calcium: 9.3 mg/dL (ref 8.9–10.3)
Creatinine, Ser: 1.12 mg/dL (ref 0.61–1.24)
GFR calc non Af Amer: >60 mL/min (ref >60)
Glucose, Bld: 94 mg/dL (ref 70–99)
POTASSIUM: 4.3 mmol/L (ref 3.5–5.1)
SODIUM: 138 mmol/L (ref 135–145)
Total Bilirubin: 0.6 mg/dL (ref 0.3–1.2)
Total Protein: 6.7 g/dL (ref 6.5–8.1)

## 2018-07-02 MED ORDER — DIPHENHYDRAMINE HCL 50 MG/ML IJ SOLN
50.0000 mg | Freq: Once | INTRAMUSCULAR | Status: AC
  Administered 2018-07-02: 50 mg via INTRAVENOUS

## 2018-07-02 MED ORDER — SODIUM CHLORIDE 0.9 % IV SOLN
7.5000 mg/kg | INTRAVENOUS | Status: DC
  Administered 2018-07-02: 700 mg via INTRAVENOUS
  Filled 2018-07-02: qty 70

## 2018-07-02 MED ORDER — ACETAMINOPHEN 325 MG PO TABS
650.0000 mg | ORAL_TABLET | Freq: Once | ORAL | Status: AC
  Administered 2018-07-02: 650 mg via ORAL

## 2018-07-02 MED ORDER — SODIUM CHLORIDE 0.9 % IV SOLN
INTRAVENOUS | Status: DC
  Administered 2018-07-02: 09:00:00 via INTRAVENOUS

## 2018-07-02 MED ORDER — ACETAMINOPHEN 325 MG PO TABS
ORAL_TABLET | ORAL | Status: AC
  Administered 2018-07-02: 650 mg via ORAL
  Filled 2018-07-02: qty 2

## 2018-07-02 MED ORDER — METHYLPREDNISOLONE SODIUM SUCC 40 MG IJ SOLR
INTRAMUSCULAR | Status: AC
  Administered 2018-07-02: 40 mg via INTRAVENOUS
  Filled 2018-07-02: qty 1

## 2018-07-02 MED ORDER — METHYLPREDNISOLONE SODIUM SUCC 40 MG IJ SOLR
40.0000 mg | Freq: Once | INTRAMUSCULAR | Status: AC
  Administered 2018-07-02: 40 mg via INTRAVENOUS

## 2018-07-02 MED ORDER — DIPHENHYDRAMINE HCL 50 MG/ML IJ SOLN
INTRAMUSCULAR | Status: AC
  Administered 2018-07-02: 50 mg via INTRAVENOUS
  Filled 2018-07-02: qty 1

## 2018-07-29 IMAGING — CT CT ABD-PELV W/ CM
2 of 5 series · 16 of 46 positions shown, 18 images · IV contrast (APPLIED)
Comparison: 5899.  Chest CT of 07/18/2017

CLINICAL DATA: History of ulcerative colitis and Crohn disease. DVT
and pulmonary embolism. Evaluate for colon cancer.

EXAM:
CT ABDOMEN AND PELVIS WITH CONTRAST
TECHNIQUE: Multidetector CT imaging of the abdomen and pelvis was performed
using the standard protocol following bolus administration of
intravenous contrast.
CONTRAST:  100mL [id number] IOPAMIDOL ([id number]) INJECTION 61%

[Series 2: axial st · axial · 0.80mm/px · z∈[+568,+1043]mm · 13 of 107 slices shown, 15 images]
[im 6/107  soft-tissue]
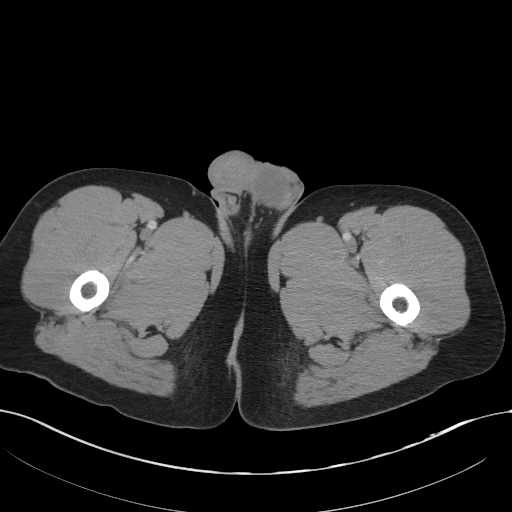
[im 6/107  bone]
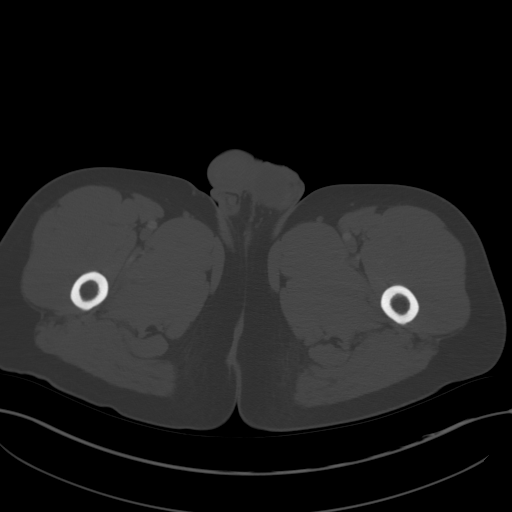
[im 16/107  soft-tissue]
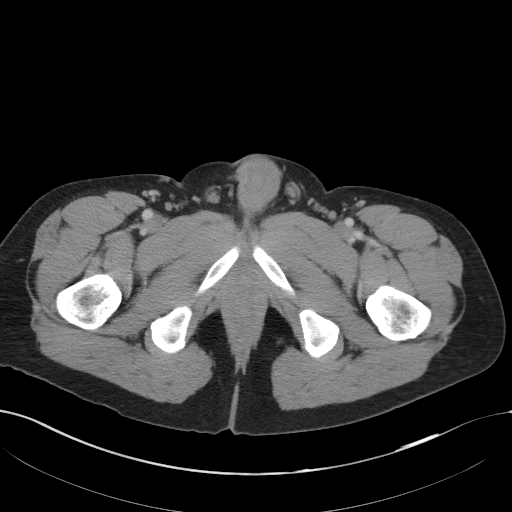
[im 22/107  soft-tissue]
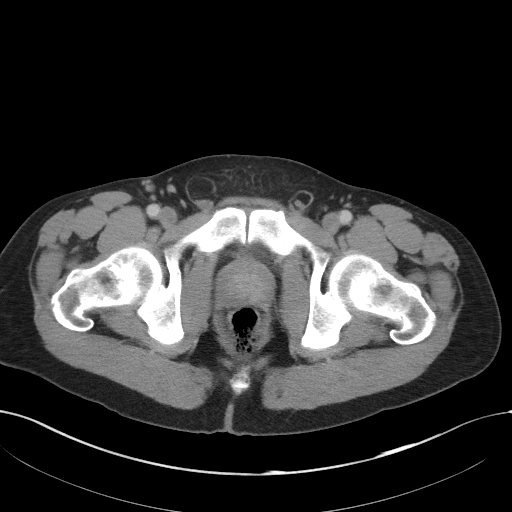
[im 32/107  soft-tissue]
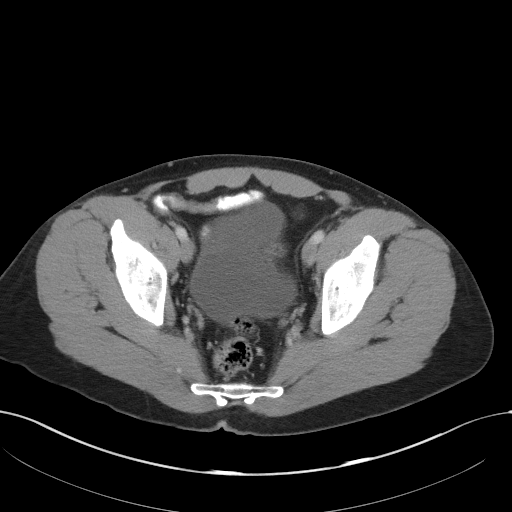
[im 38/107  soft-tissue]
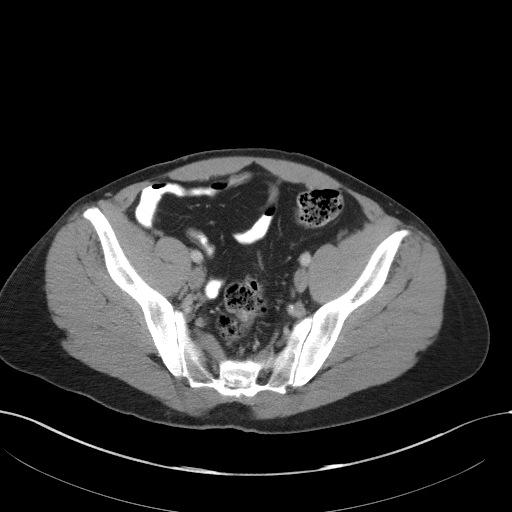
[im 48/107  soft-tissue]
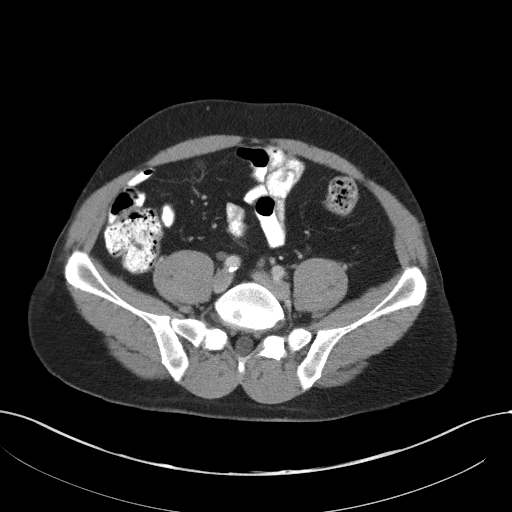
[im 54/107  soft-tissue]
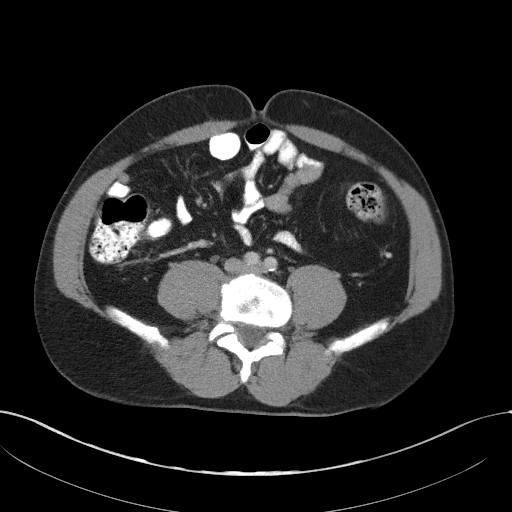
[im 59/107  soft-tissue]
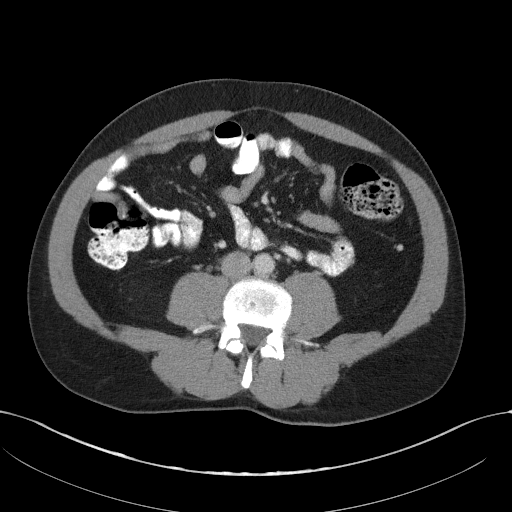
[im 69/107  soft-tissue]
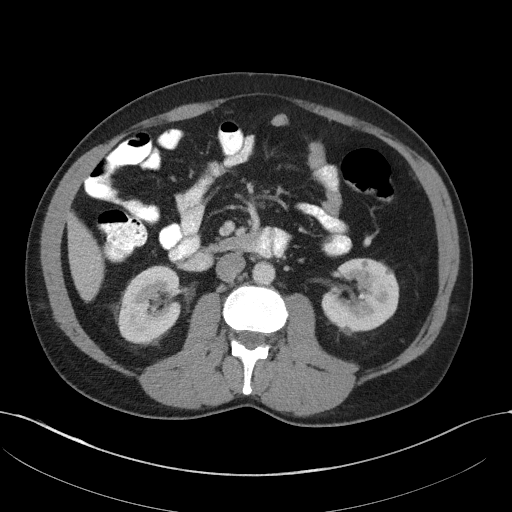
[im 69/107  bone]
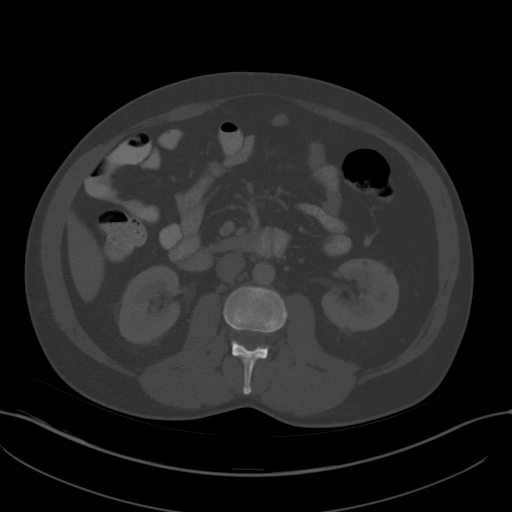
[im 75/107  soft-tissue]
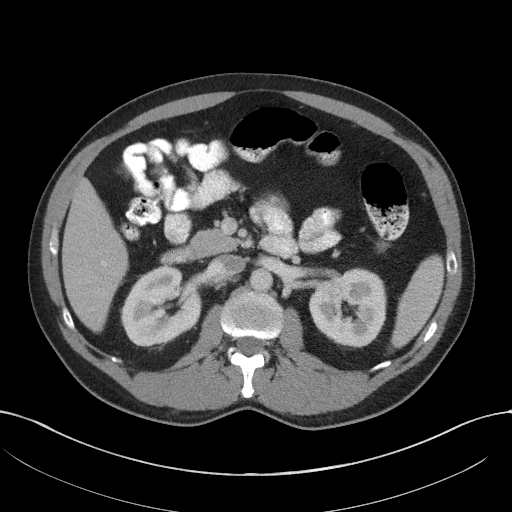
[im 85/107  soft-tissue]
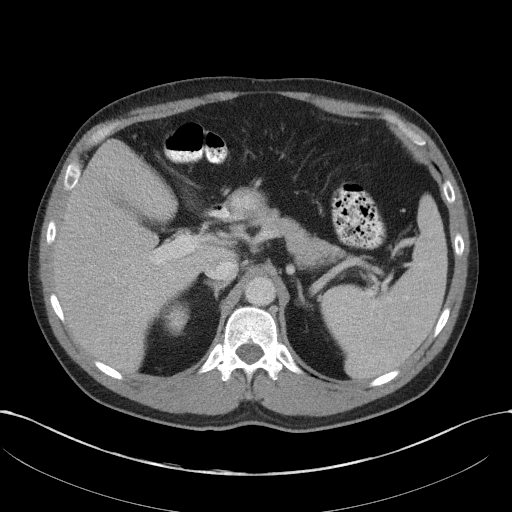
[im 91/107  soft-tissue]
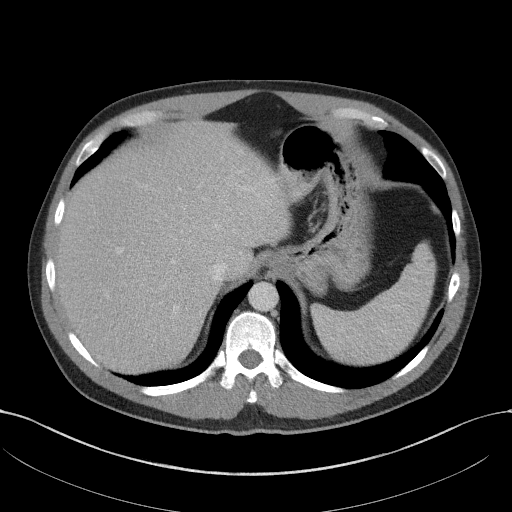
[im 101/107  soft-tissue]
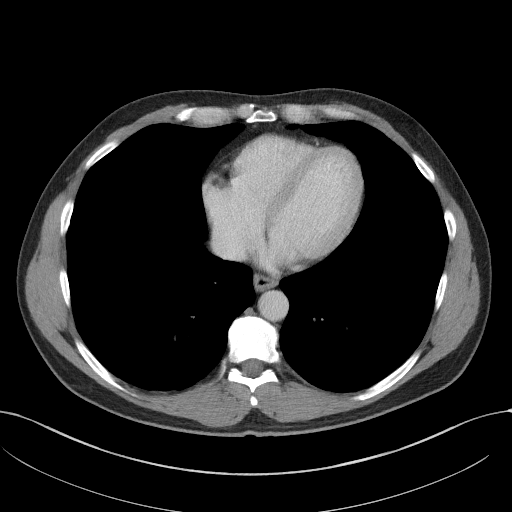

[Series 5: coronal st · coronal · 0.87mm/px · 3 of 95 slices shown]
[im 32/95  soft-tissue]
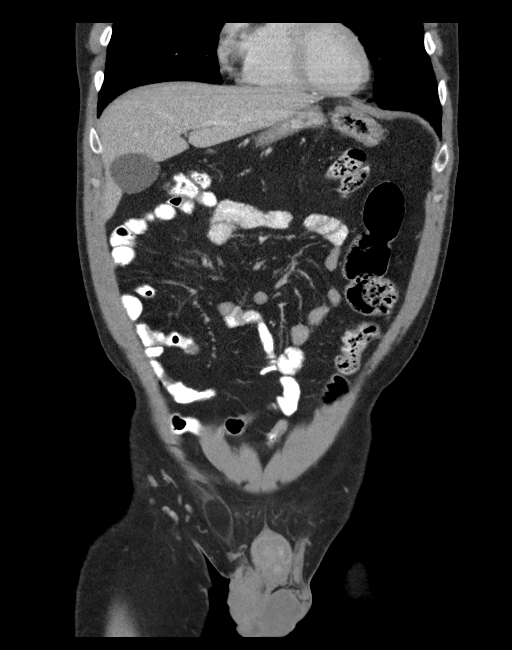
[im 42/95  soft-tissue]
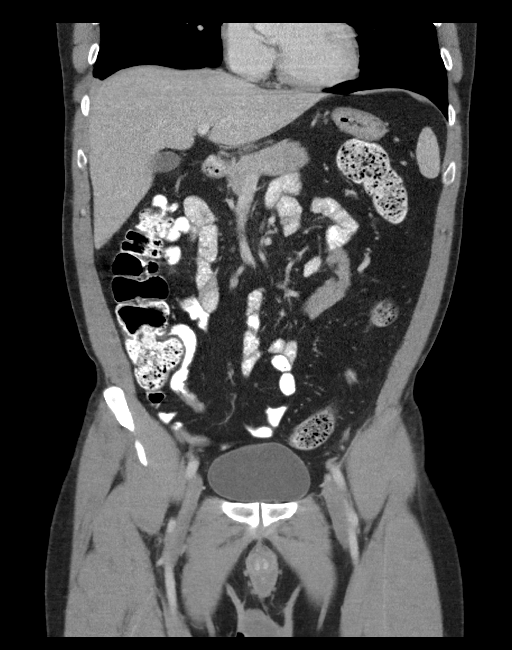
[im 53/95  soft-tissue]
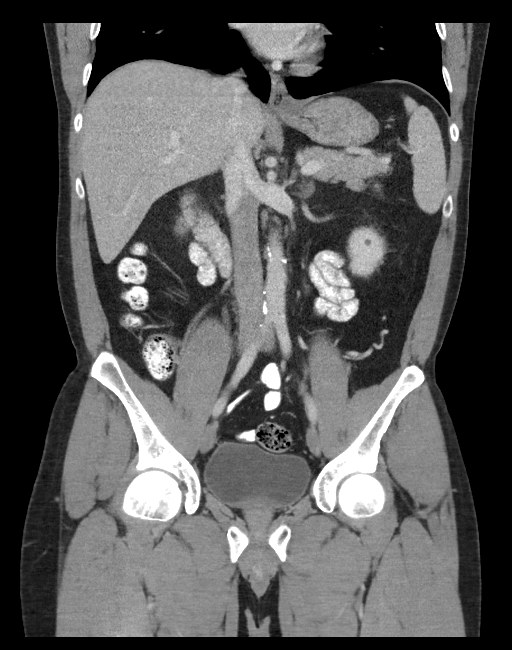

[16 of 46 positions shown; findings below may reference images not displayed]

FINDINGS: Lower chest: Subpleural right base airspace disease laterally is
decreased since the prior chest CT and suspicious for evolving
infarct. Normal heart size without pericardial or pleural effusion.

Hepatobiliary: Normal liver. Normal gallbladder, without biliary
ductal dilatation.

Pancreas: Normal, without mass or ductal dilatation.

Spleen: Normal in size, without focal abnormality.

Adrenals/Urinary Tract: Normal adrenal glands. Bilateral too small
to characterize renal lesions. No hydronephrosis.

A lower pole left renal lesion measures 2.0 cm and greater than
fluid density on image 30/series 7 and image 44/series 2. This is
new since 03/05/2006. Normal urinary bladder.

Stomach/Bowel: Normal stomach, without wall thickening. No evidence
of colitis or dominant colonic mass. Normal terminal ileum. Appendix
not visualized. Normal small bowel.

Vascular/Lymphatic: Aortic and branch vessel atherosclerosis. No
abdominopelvic adenopathy.

Reproductive: Normal prostate.

Other: Fat containing right inguinal hernia.

Musculoskeletal: No acute osseous abnormality.
IMPRESSION: 1. No evidence of active colitis or dominant colonic mass.
2. **An incidental finding of potential clinical significance has
been found. Lower pole left renal lesion demonstrates complexity and
could represent a complex cyst or a solid neoplasm. Recommend
further evaluation with dedicated pre and post contrast abdominal
MRI (preferred) or renal protocol CT.**
3.  Aortic Atherosclerosis ([id number]).  This is age advanced.
These results will be called to the ordering clinician or
representative by the Radiologist Assistant, and communication
documented in the PACS or zVision Dashboard.

## 2018-07-30 ENCOUNTER — Telehealth: Payer: Self-pay | Admitting: Oncology

## 2018-07-30 ENCOUNTER — Inpatient Hospital Stay: Payer: 59 | Attending: Oncology | Admitting: Oncology

## 2018-07-30 VITALS — BP 125/74 | HR 70 | Temp 98.2°F | Resp 17 | Ht 68.0 in | Wt 194.6 lb

## 2018-07-30 DIAGNOSIS — Z7901 Long term (current) use of anticoagulants: Secondary | ICD-10-CM

## 2018-07-30 DIAGNOSIS — Z86711 Personal history of pulmonary embolism: Secondary | ICD-10-CM

## 2018-07-30 DIAGNOSIS — Z86718 Personal history of other venous thrombosis and embolism: Secondary | ICD-10-CM | POA: Diagnosis not present

## 2018-07-30 DIAGNOSIS — I2699 Other pulmonary embolism without acute cor pulmonale: Secondary | ICD-10-CM

## 2018-07-30 NOTE — Telephone Encounter (Signed)
Scheduled appt per 11/15 los - pt is aware of appt date and time. Doppler scheduled with Butch Penny in heart and vascular.

## 2018-07-30 NOTE — Progress Notes (Signed)
Hematology and Oncology Follow Up Visit  Jose [name] [id number] [birth year] 47 y.o. 07/30/2018 10:30 AM Wendling, Crosby Oyster, DOWendling, Crosby Oyster*   Principle Diagnosis: 47 year old man with:  1. Recurrent superficial phlebitis the most recent of which was in April 2017.   2. Pulmonary embolism diagnosed in July 18, 2017.  Hypercoagulable work-up not show any inherited thrombophilia.   Current therapy: Xarelto as full dose anticoagulation started in November 2018.  Interim History: Jose [name] returns today for repeat evaluation.  Since the last visit, he reports no major changes in his health.  He continues to take Xarelto without any recent complications.  He denies any excessive bleeding or recurrent thrombosis.  He has been exercising more regularly and of lost weight.  He has stopped smoking as well.  His quality of life remains unchanged and continues to enjoy excellent performance status.  He does not report any headaches, blurry vision, syncope or seizures.  No changes in his mental status or confusion.  He does not report any fevers, chills or sweats. He does not report any cough, wheezing or hemoptysis. He does not report any nausea, vomiting or abdominal pain.  Denies any changes in bowel habits.  He does not report any frequency urgency or hesitancy. He does not report any joint pain or pathological fractures.  He denies any leading or clotting tendency.  He denies any ecchymosis or petechiae.  He denies any skin rashes or lesions.  Denies any mood changes.  He denies any heat or cold intolerance.  Remaining review of systems is negative.  Medications: I have reviewed the patient's current medications.  Current Outpatient Medications  Medication Sig Dispense Refill  . InFLIXimab (REMICADE IV) Inject into the vein every 8 (eight) weeks.      Marland Kitchen omeprazole (PRILOSEC) 20 MG capsule Take 20 mg by mouth daily as needed for heartburn.  5  . rivaroxaban (XARELTO) 20  MG TABS tablet TAKE 1 TABLET (20 MG TOTAL) DAILY WITH SUPPER BY MOUTH 90 tablet 1   Current Facility-Administered Medications  Medication Dose Route Frequency Provider Last Rate Last Dose  . methylPREDNISolone acetate (DEPO-MEDROL) injection 20 mg  20 mg Intramuscular Once [name], DO         Allergies:  Allergies  Allergen Reactions  . Statins Other (See Comments)    Chest pain    Past Medical History, Surgical history, Social history, and Family History were reviewed and updated.   Physical Exam:  Blood pressure 125/74, pulse 70, temperature 98.2 F (36.8 C), temperature source Oral, resp. rate 17, height 5\' 8"  (1.727 m), weight 194 lb 9.6 oz (88.3 kg), SpO2 100 %.   ECOG: 0   General appearance: Comfortable appearing without any discomfort Head: Normocephalic without any trauma Oropharynx: Mucous membranes are moist and pink without any thrush or ulcers. Eyes: Pupils are equal and round reactive to light. Lymph nodes: No cervical, supraclavicular, inguinal or axillary lymphadenopathy.   Heart:regular rate and rhythm.  S1 and S2 without leg edema. Lung: Clear without any rhonchi or wheezes.  No dullness to percussion. Abdomin: Soft, nontender, nondistended with good bowel sounds.  No hepatosplenomegaly. Musculoskeletal: No joint deformity or effusion.  Full range of motion noted. Neurological: No deficits noted on motor, sensory and deep tendon reflex exam. Skin: No petechial rash or dryness.  Appeared moist.   Lab Results: Lab Results  Component Value Date   WBC 5.3 07/02/2018   HGB 14.1 07/02/2018   HCT 43.6 07/02/2018  MCV 87.2 07/02/2018   PLT 206 07/02/2018     Chemistry      Component Value Date/Time   NA 138 07/02/2018 0847   K 4.3 07/02/2018 0847   CL 110 07/02/2018 0847   CO2 23 07/02/2018 0847   BUN 19 07/02/2018 0847   CREATININE 1.12 07/02/2018 0847      Component Value Date/Time   CALCIUM 9.3 07/02/2018 0847   ALKPHOS 58  07/02/2018 0847   AST 27 07/02/2018 0847   ALT 30 07/02/2018 0847   BILITOT 0.6 07/02/2018 0847   BILITOT 0.3 03/19/2018 0817     Impression and Plan:  47 year old man with:  1. Pulmonary embolism diagnosed in November 2018.  He has history of recurrent superficial phlebitis and deep vein thrombosis at that time.  He has been on full dose anticoagulation for the last 12 months.  He is thrombosis appears to be unprovoked although he did receive Remicade for inflammatory bowel disease could contributed.  Risks and benefits of continuing Xarelto indefinitely versus risk of discontinuation was discussed today.  Long-term complication associated with this therapy as well as risk of recurrent thrombosis was discussed.  After discussion today, he is agreeable to continue on Xarelto and we will repeat ultrasound Dopplers of the lower extremities to ensure resolution of his previous deep vein thrombosis.  2.  Kidney mass: Cystic in nature based on MRI obtained in November 2018.  No further evaluation is needed.  3.  Follow-up: Will be in 6 months for repeat evaluation.  15  minutes was spent with the patient face-to-face today.  More than 50% of time was dedicated to reviewing his disease status, risk of recurrent thrombosis assessment, answering questions regarding future plan of care and reviewing imaging studies.  Zola Button, MD 11/15/201910:30 AM

## 2018-08-06 ENCOUNTER — Encounter (HOSPITAL_BASED_OUTPATIENT_CLINIC_OR_DEPARTMENT_OTHER): Payer: 59

## 2018-08-13 ENCOUNTER — Ambulatory Visit (HOSPITAL_BASED_OUTPATIENT_CLINIC_OR_DEPARTMENT_OTHER)
Admission: RE | Admit: 2018-08-13 | Discharge: 2018-08-13 | Disposition: A | Discharge status: Home / Self Care | Payer: 59 | Source: Ambulatory Visit | Attending: Oncology | Admitting: Oncology

## 2018-08-13 ENCOUNTER — Telehealth: Payer: Self-pay

## 2018-08-13 DIAGNOSIS — I2699 Other pulmonary embolism without acute cor pulmonale: Secondary | ICD-10-CM | POA: Insufficient documentation

## 2018-08-13 NOTE — Telephone Encounter (Signed)
Contacted patient and made aware of results. 

## 2018-08-13 NOTE — Progress Notes (Signed)
Lower  extremity venous doppler performed bilatterally    Findings appear improved from previous examination.  There is no evidence of deep vein thrombosis in the right lower extremity.There is no evidence of right superficial venous thrombosis. No cystic structure found in the popliteal fossa.    There is no evidence of deep vein thrombosis in left lower extremity.There is no evidence of left superficial venous thrombosis. No cystic structure found in the popliteal fossa.   There is no evidence of thrombosis in [place]us vein or gastroc vein bilaterally.    08/13/18 Cardell Peach RDCS, RVT

## 2018-08-13 NOTE — Telephone Encounter (Signed)
-----   Message from Wyatt Portela, MD sent at 08/13/2018  3:22 PM EST ----- Please let him know his dopplers are negative for any residual clots.

## 2018-08-19 ENCOUNTER — Encounter: Payer: Self-pay | Admitting: Cardiology

## 2018-08-19 ENCOUNTER — Ambulatory Visit: Payer: 59 | Admitting: Cardiology

## 2018-08-19 VITALS — BP 112/70 | HR 76 | Ht 68.0 in | Wt 199.2 lb

## 2018-08-19 DIAGNOSIS — E785 Hyperlipidemia, unspecified: Secondary | ICD-10-CM

## 2018-08-19 DIAGNOSIS — Z8249 Family history of ischemic heart disease and other diseases of the circulatory system: Secondary | ICD-10-CM

## 2018-08-19 NOTE — Patient Instructions (Addendum)
Your physician recommends that you continue on your current medications as directed. Please refer to the Current Medication list given to you today.   Your physician recommends that you return for lab work in:  [place], LIPO PROTEIN A AND B   You have been referred to  [place] physician recommends that you schedule a follow-up appointment in:  [facility] [place]

## 2018-08-19 NOTE — Progress Notes (Signed)
Cardiology Office Note:    Date:  08/19/2018   ID:  Jose [name], DOB November 05, 1970, MRN [id number]  PCP:  [name], DO  Cardiologist:  [name], MD  Electrophysiologist:  None   Referring MD: [name]*     History of Present Illness:    Jose [name] is a 47 y.o. male here for follow-up of hyperlipidemia and intolerance to statin.  Nuclear stress 03/30/15-low risk, no ischemia, 10 minutes 45 seconds.  No chest [name], no syncope, no bleeding.  Brother had bypass surgery in 2015.  Quit smoking. 09/2015  Had superficial thrombopheltisits after travel.No DVT. Bactrim. Has also had some thrombus at the infusion site of Remicade. On Xarelto.   Overall he is very proud  out of his smoking cessation.   He is changed jobs, Chief Financial Officer.  He is curious whether or not he should go back on his WelChol or Niaspan.  We talked about trial data.  He is also curious about insulin resistance, being prediabetic.  Denies any chest [name] shortness of breath fevers chills.  He is exercising well.  Past Medical History:  Diagnosis Date  . Crohn's disease ([place])   . Hyperlipidemia   . Thrombophlebitis     History reviewed. No pertinent surgical history.  Current Medications: Current Meds  Medication Sig  . InFLIXimab (REMICADE IV) Inject into the vein every 8 (eight) weeks.    Marland Kitchen omeprazole (PRILOSEC) 20 MG capsule Take 20 mg by mouth daily as needed for heartburn.  . rivaroxaban (XARELTO) 20 MG TABS tablet TAKE 1 TABLET (20 MG TOTAL) DAILY WITH SUPPER BY MOUTH   Current Facility-Administered Medications for the 08/19/18 encounter (Office Visit) with Jose Pain, MD  Medication  . methylPREDNISolone acetate (DEPO-MEDROL) injection 20 mg     Allergies:   Statins   Social History   Socioeconomic History  . Marital status: Married    Spouse name: Not on file  . Number of children: Not on file  . Years of education: Not on file  . Highest  education level: Not on file  Occupational History  . Not on file  Social Needs  . Financial resource strain: Not on file  . Food insecurity:    Worry: Not on file    Inability: Not on file  . Transportation needs:    Medical: Not on file    Non-medical: Not on file  Tobacco Use  . Smoking status: Current Every Day Smoker    Packs/day: 0.50    Types: Cigarettes  . Smokeless tobacco: Never Used  Substance and Sexual Activity  . Alcohol use: No  . Drug use: No  . Sexual activity: Not on file  Lifestyle  . Physical activity:    Days per week: Not on file    Minutes per session: Not on file  . Stress: Not on file  Relationships  . Social connections:    Talks on phone: Not on file    Gets together: Not on file    Attends religious service: Not on file    Active member of club or organization: Not on file    Attends meetings of clubs or organizations: Not on file    Relationship status: Not on file  Other Topics Concern  . Not on file  Social History Narrative  . Not on file     Family History: The patient's family history includes Heart disease in his mother.  ROS:   Please see the history of  present illness.     All other systems reviewed and are negative.  EKGs/Labs/Other Studies Reviewed:    The following studies were reviewed today: Prior office notes lab work EKG  EKG:  None today  Recent Labs: 07/02/2018: ALT 30; BUN 19; Creatinine, Ser 1.12; Hemoglobin 14.1; Platelets 206; Potassium 4.3; Sodium 138  Recent Lipid Panel    Component Value Date/Time   CHOL 180 03/19/2018 0817   TRIG 161 (H) 03/19/2018 0817   HDL 36 (L) 03/19/2018 0817   CHOLHDL 5.0 03/19/2018 0817   CHOLHDL 2.6 03/14/2016 0901   VLDL 20 03/14/2016 0901   LDLCALC 112 (H) 03/19/2018 0817    Physical Exam:    VS:  BP 112/70   Pulse 76   Ht 5' 8"  (1.727 m)   Wt 199 lb 3.2 oz (90.4 kg)   SpO2 96%   BMI 30.29 kg/m     Wt Readings from Last 3 Encounters:  08/19/18 199 lb 3.2 oz  (90.4 kg)  07/30/18 194 lb 9.6 oz (88.3 kg)  07/02/18 195 lb (88.5 kg)     GEN:  Well nourished, well developed in no acute distress HEENT: Normal NECK: No JVD; No carotid bruits LYMPHATICS: No lymphadenopathy CARDIAC: RRR, no murmurs, rubs, gallops RESPIRATORY:  Clear to auscultation without rales, wheezing or rhonchi  ABDOMEN: Soft, non-tender, non-distended MUSCULOSKELETAL:  No edema; No deformity  SKIN: Warm and dry NEUROLOGIC:  Alert and oriented x 3 PSYCHIATRIC:  Normal affect   ASSESSMENT:    1. Family history of coronary artery disease   2. FH: coronary artery disease   3. Hyperlipidemia, unspecified hyperlipidemia type    PLAN:    In order of problems listed above:  Hyperlipidemia/statin intolerance - I will have him sit down with the lipid clinic as he has seen in the past for further evaluation and suggestions.  He is statin intolerant, does not wish to be on statin therapy.  Discussed that there are possible alternatives at this point.  I am unsure whether or not Niaspan or WelChol would actually help him in stroke or heart attack risk reduction.  He is also concerned about insulin resistance.  I will order a Lipomed profile for him.  Total cholesterol 180 HDL 36 LDL 112 triglycerides 161 He has a strong family history of coronary artery disease.     Medication Adjustments/Labs and Tests Ordered: Current medicines are reviewed at length with the patient today.  Concerns regarding medicines are outlined above.  Orders Placed This Encounter  Procedures  . NMR, lipoprofile  . Apolipoprotein B  . Lipoprotein A (LPA)  . AMB Referral to [facility] Lip[facility] Disorders [facility]   No orders of the defined types were placed in this encounter.   Patient Instructions  Your physician recommends that you continue on your current medications as directed. Please refer to the Current Medication list given to you today.   Your physician recommends that you return for lab work  in:  [place], LIPO PROTEIN A AND B   You have been referred to  [facility]    Your physician recommends that you schedule a follow-up appointment in:  6 MONTHS WITH DR  Joylene Grapes, MD  08/19/2018 10:35 AM    [place]

## 2018-08-23 DIAGNOSIS — J343 Hypertrophy of nasal turbinates: Secondary | ICD-10-CM | POA: Diagnosis not present

## 2018-08-24 ENCOUNTER — Other Ambulatory Visit: Payer: 59

## 2018-08-25 ENCOUNTER — Other Ambulatory Visit: Payer: 59 | Admitting: *Deleted

## 2018-08-25 DIAGNOSIS — Z8249 Family history of ischemic heart disease and other diseases of the circulatory system: Secondary | ICD-10-CM | POA: Diagnosis not present

## 2018-08-26 LAB — NMR, LIPOPROFILE
Cholesterol, Total: 179 mg/dL (ref 100–199)
HDL Particle Number: 23 umol/L — ABNORMAL LOW (ref >=30.5)
HDL-C: 31 mg/dL — AB (ref >39)
LDL Particle Number: 1641 nmol/L — ABNORMAL HIGH (ref <1000)
LDL Size: 20.9 nm (ref >20.5)
LDL-C: 122 mg/dL — AB (ref 0–99)
LP-IR Score: 56 — ABNORMAL HIGH (ref <=45)
Small LDL Particle Number: 857 nmol/L — ABNORMAL HIGH (ref <=527)
Triglycerides: 131 mg/dL (ref 0–149)

## 2018-08-26 LAB — APOLIPOPROTEIN B: Apolipoprotein B: 117 mg/dL — ABNORMAL HIGH (ref <90)

## 2018-08-26 LAB — LIPOPROTEIN A (LPA): LIPOPROTEIN (A): 112.4 nmol/L — AB (ref <75.0)

## 2018-08-27 ENCOUNTER — Ambulatory Visit (HOSPITAL_COMMUNITY)
Admission: RE | Admit: 2018-08-27 | Discharge: 2018-08-27 | Disposition: A | Discharge status: Home / Self Care | Payer: 59 | Source: Ambulatory Visit | Attending: Gastroenterology | Admitting: Gastroenterology

## 2018-08-27 DIAGNOSIS — K5090 Crohn's disease, unspecified, without complications: Secondary | ICD-10-CM | POA: Insufficient documentation

## 2018-08-27 MED ORDER — SODIUM CHLORIDE 0.9 % IV SOLN
7.5000 mg/kg | INTRAVENOUS | Status: DC
  Administered 2018-08-27: 700 mg via INTRAVENOUS
  Filled 2018-08-27: qty 70

## 2018-08-27 MED ORDER — METHYLPREDNISOLONE SODIUM SUCC 40 MG IJ SOLR
INTRAMUSCULAR | Status: AC
  Filled 2018-08-27: qty 1

## 2018-08-27 MED ORDER — METHYLPREDNISOLONE SODIUM SUCC 40 MG IJ SOLR
40.0000 mg | Freq: Once | INTRAMUSCULAR | Status: AC
  Administered 2018-08-27: 40 mg via INTRAVENOUS

## 2018-08-27 MED ORDER — ACETAMINOPHEN 325 MG PO TABS
ORAL_TABLET | ORAL | Status: AC
  Filled 2018-08-27: qty 2

## 2018-08-27 MED ORDER — DIPHENHYDRAMINE HCL 50 MG/ML IJ SOLN
INTRAMUSCULAR | Status: AC
  Filled 2018-08-27: qty 1

## 2018-08-27 MED ORDER — DIPHENHYDRAMINE HCL 50 MG/ML IJ SOLN
50.0000 mg | Freq: Once | INTRAMUSCULAR | Status: AC
  Administered 2018-08-27: 50 mg via INTRAVENOUS

## 2018-08-27 MED ORDER — SODIUM CHLORIDE 0.9 % IV SOLN
INTRAVENOUS | Status: DC
  Administered 2018-08-27: 09:00:00 via INTRAVENOUS

## 2018-08-27 MED ORDER — ACETAMINOPHEN 325 MG PO TABS
650.0000 mg | ORAL_TABLET | Freq: Once | ORAL | Status: AC
  Administered 2018-08-27: 650 mg via ORAL

## 2018-10-05 ENCOUNTER — Ambulatory Visit: Payer: 59 | Admitting: Internal Medicine

## 2018-10-20 ENCOUNTER — Encounter: Payer: Self-pay | Admitting: Internal Medicine

## 2018-10-20 ENCOUNTER — Ambulatory Visit: Payer: 59 | Admitting: Internal Medicine

## 2018-10-20 VITALS — BP 130/78 | HR 74 | Ht 68.0 in | Wt 207.2 lb

## 2018-10-20 DIAGNOSIS — Z8249 Family history of ischemic heart disease and other diseases of the circulatory system: Secondary | ICD-10-CM | POA: Diagnosis not present

## 2018-10-20 DIAGNOSIS — E7841 Elevated Lipoprotein(a): Secondary | ICD-10-CM | POA: Diagnosis not present

## 2018-10-20 DIAGNOSIS — E782 Mixed hyperlipidemia: Secondary | ICD-10-CM

## 2018-10-20 DIAGNOSIS — E785 Hyperlipidemia, unspecified: Secondary | ICD-10-CM | POA: Insufficient documentation

## 2018-10-20 NOTE — Progress Notes (Signed)
LIPID CLINIC CONSULT NOTE  Chief Complaint: Dyslipidemia  Primary Care Physician: Jose Pal, DO  Primary Cardiologist:  Jose Furbish, MD  HPI:  Jose [name] is a 48 y.o. male who is being seen today for the evaluation of dyslipidemia at the request of Dr. Marlou [name]. This is a pleasant 48 year old male patient of Dr. Marlou [name] with a history of DVT and PE, Crohn's disease on Remicade, and dyslipidemia.  He also has a significant family history of coronary disease.  In fact his dad died at age 17 of an MI and his mother died of a stroke in her 7s.  He has a brother who had a stent and bypass surgery at age 27.  He has been followed closely by Dr. Marlou [name] for cardiovascular risk reduction.  He has been noted to have elevated cholesterol in the past and wants to be proactive about this as he has 5 boys at home.  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  His most recent lipid profile was an NMR study which showed LDL-P of 1641, triglycerides 131, HDL 31 and LDL-C of 122.  In Apo(B) level was assessed at 117.  In addition he was found to have elevated LP(a) of 112.  Unfortunately he has had side effects to statins in the past.  He has been on a number of different statin medications which caused pain across his chest and upper back between his shoulder blades.  Infected center to the hospital once.  After stopping the medications his symptoms have resolved.  Currently he is not on any therapy for his dyslipidemia but was wondering what other options may be available.  Previously had been tried on [employer] and niacin.  At one point he was taking Niaspan 1000 mg daily however did have significant flushing I was not able to increase the dose much further.  He is aware that current studies do not suggest there is significant cardiovascular risk reduction with niacin although there is some evidence it may lower LP(a).  PMHx:  Past Medical  History:  Diagnosis Date  . Crohn's disease ([place])   . Hyperlipidemia   . Thrombophlebitis     No past surgical history on file.  FAMHx:  Family History  Problem Relation Age of Onset  . Heart disease Mother     SOCHx:   reports that he has been smoking cigarettes. He has been smoking about 0.50 packs per day. He has never used smokeless tobacco. He reports that he does not drink alcohol or use drugs.  ALLERGIES:  Allergies  Allergen Reactions  . Statins Other (See Comments)    Chest pain    ROS: Pertinent items noted in HPI and remainder of comprehensive ROS otherwise negative.  HOME MEDS: Current Outpatient Medications on File Prior to Visit  Medication Sig Dispense Refill  . InFLIXimab (REMICADE IV) Inject into the vein every 8 (eight) weeks.      Marland Kitchen omeprazole (PRILOSEC) 20 MG capsule Take 20 mg by mouth daily as needed for heartburn.  5  . rivaroxaban (XARELTO) 20 MG TABS tablet TAKE 1 TABLET (20 MG TOTAL) DAILY WITH SUPPER BY MOUTH 90 tablet 1   Current Facility-Administered Medications on File Prior to Visit  Medication Dose Route Frequency Provider Last Rate Last Dose  . methylPREDNISolone acetate (DEPO-MEDROL) injection 20 mg  20 mg Intramuscular Once Jose Pal, DO        LABS/IMAGING: No results  found for this or any previous visit (from the past 48 hour(s)). No results found.  LIPID PANEL:    Component Value Date/Time   CHOL 180 03/19/2018 0817   TRIG 161 (H) 03/19/2018 0817   HDL 36 (L) 03/19/2018 0817   CHOLHDL 5.0 03/19/2018 0817   CHOLHDL 2.6 03/14/2016 0901   VLDL 20 03/14/2016 0901   LDLCALC 112 (H) 03/19/2018 0817    WEIGHTS: Wt Readings from Last 3 Encounters:  10/20/18 207 lb 3.2 oz (94 kg)  08/27/18 195 lb (88.5 kg)  08/19/18 199 lb 3.2 oz (90.4 kg)    VITALS: BP 130/78   Pulse 74   Ht 5' 8"  (1.727 m)   Wt 207 lb 3.2 oz (94 kg)   BMI 31.50 kg/m   EXAM: General appearance: alert and no distress Neck: no carotid  bruit, no JVD and thyroid not enlarged, symmetric, no tenderness/mass/nodules Lungs: clear to auscultation bilaterally Heart: regular rate and rhythm, S1, S2 normal, no murmur, click, rub or gallop Abdomen: soft, non-tender; bowel sounds normal; no masses,  no organomegaly Extremities: extremities normal, atraumatic, no cyanosis or edema and no tendon xanthomas Pulses: 2+ and symmetric Skin: Skin color, texture, turgor normal. No rashes or lesions Neurologic: Grossly normal Psych: Pleasant  EKG: Deferred  ASSESSMENT: 1. Dyslipidemia with elevated LP(a) 2. Strong family history of premature coronary disease 3. Statin intolerance  PLAN: 1.   Mr. Jose [name] has a strong family history of premature coronary disease in first-degree relatives and unfortunately has had statin intolerance.  His lipid profile shows a mixed dyslipidemia but predominantly elevated LP(a).  Currently he is not on statin therapy due to intolerance and we discussed options for treatment regarding his risk and dyslipidemia.  His traditional 10-year ASCVD risk is estimated at 4.6% which is low over his lifetime ASCVD risk is estimated at 36%.  It is unclear whether he has any early onset coronary disease therefore I would consider coronary artery calcium scoring to better risk stratify him.  His LP(a) is significantly elevated and likely explains early onset heart disease in his family as is genetically determined.  In addition because of his function similar to plasminogen, it is prothrombotic and may have played a role in his DVT/PE, especially given the fact that he has underlying autoimmune disorder, was smoking and was often sedentary on long airline flights.  Currently we are hoping to ramp up involvement in a clinical trial which would assess and oligonucleotide to the RNA that codes for lipoprotein A.  In pre-clinical trials this decreases LP(a) by up to 80% however we do not know the clinical significance of this yet.  I  discussed possibly enrolling him in the clinical trial and he seems somewhat hesitant about it however would be open minded.  If he is found to have any coronary artery calcification, I think we can make even a stronger argument to start him on PCSK9 inhibitor therapy.  There is evidence of 20 to 30% reduction in LP(a) on PCSK9 inhibitors, however the data yet is not clear as to whether this provides additional risk reduction.  Thanks for the kind referral.  Pixie Casino, MD, FACC, [place] Director of the Advanced Lipid Disorders &  Cardiovascular Risk Reduction Clinic Diplomate of the American Board of Clinical Lipidology Attending Cardiologist  Direct Dial: [phone]  Fax: [phone]  Website:  [email or web address]  10/20/2018, 12:58 PM

## 2018-10-20 NOTE — Patient Instructions (Signed)
Your physician recommends that you continue on your current medications as directed. Please refer to the Current Medication list given to you today.  Your physician recommends that you return for a FASTING lipid profile, NMR, LPA in 4 months  Dr. Debara Pickett recommends that you schedule a follow up visit with him the in the [place] in 4 months. Please have fasting blood work about 1 week prior to this visit and he will review the blood work results with you at your appointment.    Dr.Hilty has ordered a CT coronary calcium score. This test is done at [address]. This is $150 out of pocket.   Coronary CalciumScan A coronary calcium scan is an imaging test used to look for deposits of calcium and other fatty materials (plaques) in the inner lining of the blood vessels of the heart (coronary arteries). These deposits of calcium and plaques can partly clog and narrow the coronary arteries without producing any symptoms or warning signs. This puts a person at risk for a heart attack. This test can detect these deposits before symptoms develop. Tell a health care provider about:  Any allergies you have.  All medicines you are taking, including vitamins, herbs, eye drops, creams, and over-the-counter medicines.  Any problems you or family members have had with anesthetic medicines.  Any blood disorders you have.  Any surgeries you have had.  Any medical conditions you have.  Whether you are pregnant or may be pregnant. What are the risks? Generally, this is a safe procedure. However, problems may occur, including:  Harm to a pregnant woman and her unborn baby. This test involves the use of radiation. Radiation exposure can be dangerous to a pregnant woman and her unborn baby. If you are pregnant, you generally should not have this procedure done.  Slight increase in the risk of cancer. This is because of the radiation involved in the test. What happens before the  procedure? No preparation is needed for this procedure. What happens during the procedure?  You will undress and remove any jewelry around your neck or chest.  You will put on a hospital gown.  Sticky electrodes will be placed on your chest. The electrodes will be connected to an electrocardiogram (ECG) machine to record a tracing of the electrical activity of your heart.  A CT scanner will take pictures of your heart. During this time, you will be asked to lie still and hold your breath for 2-3 seconds while a picture of your heart is being taken. The procedure may vary among health care providers and hospitals. What happens after the procedure?  You can get dressed.  You can return to your normal activities.  It is up to you to get the results of your test. Ask your health care provider, or the department that is doing the test, when your results will be ready. Summary  A coronary calcium scan is an imaging test used to look for deposits of calcium and other fatty materials (plaques) in the inner lining of the blood vessels of the heart (coronary arteries).  Generally, this is a safe procedure. Tell your health care provider if you are pregnant or may be pregnant.  No preparation is needed for this procedure.  A CT scanner will take pictures of your heart.  You can return to your normal activities after the scan is done. This information is not intended to replace advice given to you by your health care provider. Make sure  you discuss any questions you have with your health care provider. Document Released: 02/28/2008 Document Revised: 07/21/2016 Document Reviewed: 07/21/2016 Elsevier Interactive Patient Education  2017 [employer].

## 2018-10-21 ENCOUNTER — Other Ambulatory Visit (HOSPITAL_COMMUNITY): Payer: Self-pay

## 2018-10-22 ENCOUNTER — Ambulatory Visit (HOSPITAL_COMMUNITY)
Admission: RE | Admit: 2018-10-22 | Discharge: 2018-10-22 | Disposition: A | Discharge status: Home / Self Care | Payer: 59 | Source: Ambulatory Visit | Attending: Gastroenterology | Admitting: Gastroenterology

## 2018-10-22 DIAGNOSIS — K5090 Crohn's disease, unspecified, without complications: Secondary | ICD-10-CM | POA: Diagnosis not present

## 2018-10-22 LAB — COMPREHENSIVE METABOLIC PANEL
ALT: 33 U/L (ref 0–44)
AST: 32 U/L (ref 15–41)
Albumin: 3.8 g/dL (ref 3.5–5.0)
Alkaline Phosphatase: 55 U/L (ref 38–126)
Anion gap: 10 (ref 5–15)
BUN: 16 mg/dL (ref 6–20)
CO2: 23 mmol/L (ref 22–32)
Calcium: 9.4 mg/dL (ref 8.9–10.3)
Chloride: 106 mmol/L (ref 98–111)
Creatinine, Ser: 1.29 mg/dL — ABNORMAL HIGH (ref 0.61–1.24)
Glucose, Bld: 102 mg/dL — ABNORMAL HIGH (ref 70–99)
Potassium: 4.1 mmol/L (ref 3.5–5.1)
Sodium: 139 mmol/L (ref 135–145)
Total Bilirubin: 0.8 mg/dL (ref 0.3–1.2)
Total Protein: 6.7 g/dL (ref 6.5–8.1)

## 2018-10-22 LAB — CBC WITH DIFFERENTIAL/PLATELET
Abs Immature Granulocytes: 0.04 10*3/uL (ref 0.00–0.07)
BASOS PCT: 1 %
Basophils Absolute: 0 10*3/uL (ref 0.0–0.1)
Eosinophils Absolute: 0.3 10*3/uL (ref 0.0–0.5)
Eosinophils Relative: 5 %
HCT: 44 % (ref 39.0–52.0)
Hemoglobin: 14.6 g/dL (ref 13.0–17.0)
Immature Granulocytes: 1 %
Lymphocytes Relative: 31 %
Lymphs Abs: 1.7 10*3/uL (ref 0.7–4.0)
MCH: 29.3 pg (ref 26.0–34.0)
MCHC: 33.2 g/dL (ref 30.0–36.0)
MCV: 88.2 fL (ref 80.0–100.0)
MONO ABS: 0.5 10*3/uL (ref 0.1–1.0)
Monocytes Relative: 10 %
Neutro Abs: 2.9 10*3/uL (ref 1.7–7.7)
Neutrophils Relative %: 52 %
Platelets: 182 10*3/uL (ref 150–400)
RBC: 4.99 MIL/uL (ref 4.22–5.81)
RDW: 12.9 % (ref 11.5–15.5)
WBC: 5.4 10*3/uL (ref 4.0–10.5)
nRBC: 0 % (ref 0.0–0.2)

## 2018-10-22 MED ORDER — METHYLPREDNISOLONE SODIUM SUCC 40 MG IJ SOLR
INTRAMUSCULAR | Status: AC
  Filled 2018-10-22: qty 1

## 2018-10-22 MED ORDER — DIPHENHYDRAMINE HCL 50 MG/ML IJ SOLN
INTRAMUSCULAR | Status: AC
  Filled 2018-10-22: qty 1

## 2018-10-22 MED ORDER — DIPHENHYDRAMINE HCL 50 MG/ML IJ SOLN
50.0000 mg | Freq: Once | INTRAMUSCULAR | Status: DC
  Administered 2018-10-22: 50 mg via INTRAVENOUS

## 2018-10-22 MED ORDER — SODIUM CHLORIDE 0.9 % IV SOLN: INTRAVENOUS | Status: DC

## 2018-10-22 MED ORDER — ACETAMINOPHEN 325 MG PO TABS
650.0000 mg | ORAL_TABLET | Freq: Once | ORAL | Status: DC
  Administered 2018-10-22: 650 mg via ORAL

## 2018-10-22 MED ORDER — METHYLPREDNISOLONE SODIUM SUCC 40 MG IJ SOLR
40.0000 mg | Freq: Once | INTRAMUSCULAR | Status: DC
  Administered 2018-10-22: 40 mg via INTRAVENOUS

## 2018-10-22 MED ORDER — ACETAMINOPHEN 325 MG PO TABS
ORAL_TABLET | ORAL | Status: AC
  Filled 2018-10-22: qty 2

## 2018-10-22 MED ORDER — SODIUM CHLORIDE 0.9 % IV SOLN
7.5000 mg/kg | INTRAVENOUS | Status: DC
  Administered 2018-10-22: 700 mg via INTRAVENOUS
  Filled 2018-10-22: qty 70

## 2018-11-11 ENCOUNTER — Ambulatory Visit (INDEPENDENT_AMBULATORY_CARE_PROVIDER_SITE_OTHER)
Admission: RE | Admit: 2018-11-11 | Discharge: 2018-11-11 | Disposition: A | Discharge status: Home / Self Care | Payer: Self-pay | Source: Ambulatory Visit | Attending: Internal Medicine | Admitting: Internal Medicine

## 2018-11-11 DIAGNOSIS — E782 Mixed hyperlipidemia: Secondary | ICD-10-CM

## 2018-11-11 DIAGNOSIS — Z8249 Family history of ischemic heart disease and other diseases of the circulatory system: Secondary | ICD-10-CM

## 2018-12-12 ENCOUNTER — Other Ambulatory Visit: Payer: Self-pay | Admitting: Oncology

## 2018-12-17 ENCOUNTER — Other Ambulatory Visit: Payer: Self-pay

## 2018-12-17 ENCOUNTER — Encounter (HOSPITAL_COMMUNITY)
Admission: RE | Admit: 2018-12-17 | Discharge: 2018-12-17 | Disposition: A | Discharge status: Home / Self Care | Payer: 59 | Source: Ambulatory Visit | Attending: Gastroenterology | Admitting: Gastroenterology

## 2018-12-17 DIAGNOSIS — K5090 Crohn's disease, unspecified, without complications: Secondary | ICD-10-CM | POA: Insufficient documentation

## 2018-12-17 MED ORDER — SODIUM CHLORIDE 0.9 % IV SOLN
7.5000 mg/kg | INTRAVENOUS | Status: DC
  Administered 2018-12-17: 700 mg via INTRAVENOUS
  Filled 2018-12-17: qty 70

## 2018-12-17 MED ORDER — METHYLPREDNISOLONE SODIUM SUCC 40 MG IJ SOLR
INTRAMUSCULAR | Status: AC
  Administered 2018-12-17: 40 mg via INTRAVENOUS
  Filled 2018-12-17: qty 1

## 2018-12-17 MED ORDER — METHYLPREDNISOLONE SODIUM SUCC 40 MG IJ SOLR
40.0000 mg | Freq: Once | INTRAMUSCULAR | Status: DC
  Administered 2018-12-17: 09:00:00 40 mg via INTRAVENOUS

## 2018-12-17 MED ORDER — SODIUM CHLORIDE 0.9 % IV SOLN
INTRAVENOUS | Status: DC
  Administered 2018-12-17: 09:00:00 via INTRAVENOUS

## 2018-12-17 MED ORDER — ACETAMINOPHEN 325 MG PO TABS
ORAL_TABLET | ORAL | Status: AC
  Administered 2018-12-17: 650 mg via ORAL
  Filled 2018-12-17: qty 2

## 2018-12-17 MED ORDER — DIPHENHYDRAMINE HCL 50 MG/ML IJ SOLN
50.0000 mg | Freq: Once | INTRAMUSCULAR | Status: DC
  Administered 2018-12-17: 50 mg via INTRAVENOUS

## 2018-12-17 MED ORDER — ACETAMINOPHEN 325 MG PO TABS
650.0000 mg | ORAL_TABLET | Freq: Once | ORAL | Status: DC
  Administered 2018-12-17: 09:00:00 650 mg via ORAL

## 2018-12-17 MED ORDER — DIPHENHYDRAMINE HCL 50 MG/ML IJ SOLN
INTRAMUSCULAR | Status: AC
  Administered 2018-12-17: 50 mg via INTRAVENOUS
  Filled 2018-12-17: qty 1

## 2018-12-29 ENCOUNTER — Telehealth: Payer: Self-pay | Admitting: Oncology

## 2018-12-29 NOTE — Telephone Encounter (Signed)
R/s appt per 4/15 sch message - unable to reach patient . Left message an mailed letter .

## 2019-01-21 ENCOUNTER — Ambulatory Visit: Payer: 59 | Admitting: Oncology

## 2019-02-03 DIAGNOSIS — Z8249 Family history of ischemic heart disease and other diseases of the circulatory system: Secondary | ICD-10-CM | POA: Diagnosis not present

## 2019-02-03 DIAGNOSIS — E785 Hyperlipidemia, unspecified: Secondary | ICD-10-CM | POA: Diagnosis not present

## 2019-02-04 LAB — NMR, LIPOPROFILE
Cholesterol, Total: 227 mg/dL — ABNORMAL HIGH (ref 100–199)
HDL Particle Number: 25.1 umol/L — ABNORMAL LOW (ref >=30.5)
HDL-C: 37 mg/dL — ABNORMAL LOW (ref >39)
LDL Particle Number: 2297 nmol/L — ABNORMAL HIGH (ref <1000)
LDL Size: 20.6 nm (ref >20.5)
LDL-C: 159 mg/dL — AB (ref 0–99)
LP-IR Score: 73 — ABNORMAL HIGH (ref <=45)
Small LDL Particle Number: 1418 nmol/L — ABNORMAL HIGH (ref <=527)
Triglycerides: 155 mg/dL — ABNORMAL HIGH (ref 0–149)

## 2019-02-04 LAB — LIPID PANEL
CHOLESTEROL TOTAL: 207 mg/dL — AB (ref 100–199)
Chol/HDL Ratio: 5.8 ratio — ABNORMAL HIGH (ref 0.0–5.0)
HDL: 36 mg/dL — ABNORMAL LOW (ref >39)
LDL Calculated: 143 mg/dL — ABNORMAL HIGH (ref 0–99)
Triglycerides: 141 mg/dL (ref 0–149)
VLDL Cholesterol Cal: 28 mg/dL (ref 5–40)

## 2019-02-04 LAB — LIPOPROTEIN A (LPA): Lipoprotein (a): 125.8 nmol/L — ABNORMAL HIGH (ref <75.0)

## 2019-02-10 ENCOUNTER — Other Ambulatory Visit: Payer: Self-pay

## 2019-02-10 ENCOUNTER — Other Ambulatory Visit (HOSPITAL_COMMUNITY): Payer: Self-pay | Admitting: *Deleted

## 2019-02-11 ENCOUNTER — Encounter (HOSPITAL_COMMUNITY)
Admission: RE | Admit: 2019-02-11 | Discharge: 2019-02-11 | Disposition: A | Discharge status: Home / Self Care | Payer: 59 | Source: Ambulatory Visit | Attending: Gastroenterology | Admitting: Gastroenterology

## 2019-02-11 DIAGNOSIS — K5090 Crohn's disease, unspecified, without complications: Secondary | ICD-10-CM | POA: Diagnosis present

## 2019-02-11 LAB — COMPREHENSIVE METABOLIC PANEL
ALT: 25 U/L (ref 0–44)
AST: 24 U/L (ref 15–41)
Albumin: 3.8 g/dL (ref 3.5–5.0)
Alkaline Phosphatase: 64 U/L (ref 38–126)
Anion gap: 7 (ref 5–15)
BUN: 18 mg/dL (ref 6–20)
CO2: 23 mmol/L (ref 22–32)
Calcium: 9 mg/dL (ref 8.9–10.3)
Chloride: 104 mmol/L (ref 98–111)
Creatinine, Ser: 1.1 mg/dL (ref 0.61–1.24)
GFR calc Af Amer: >60 mL/min (ref >60)
GFR calc non Af Amer: >60 mL/min (ref >60)
Glucose, Bld: 81 mg/dL (ref 70–99)
Potassium: 4.1 mmol/L (ref 3.5–5.1)
Sodium: 134 mmol/L — ABNORMAL LOW (ref 135–145)
Total Bilirubin: 0.3 mg/dL (ref 0.3–1.2)
Total Protein: 6.8 g/dL (ref 6.5–8.1)

## 2019-02-11 LAB — CBC WITH DIFFERENTIAL/PLATELET
Abs Immature Granulocytes: 0.02 10*3/uL (ref 0.00–0.07)
Basophils Absolute: 0.1 10*3/uL (ref 0.0–0.1)
Basophils Relative: 1 %
Eosinophils Absolute: 0.3 10*3/uL (ref 0.0–0.5)
Eosinophils Relative: 5 %
HCT: 43.9 % (ref 39.0–52.0)
Hemoglobin: 14.4 g/dL (ref 13.0–17.0)
Immature Granulocytes: 0 %
Lymphocytes Relative: 31 %
Lymphs Abs: 1.8 10*3/uL (ref 0.7–4.0)
MCH: 28.6 pg (ref 26.0–34.0)
MCHC: 32.8 g/dL (ref 30.0–36.0)
MCV: 87.3 fL (ref 80.0–100.0)
Monocytes Absolute: 0.6 10*3/uL (ref 0.1–1.0)
Monocytes Relative: 11 %
Neutro Abs: 2.9 10*3/uL (ref 1.7–7.7)
Neutrophils Relative %: 52 %
Platelets: 176 10*3/uL (ref 150–400)
RBC: 5.03 MIL/uL (ref 4.22–5.81)
RDW: 12.6 % (ref 11.5–15.5)
WBC: 5.7 10*3/uL (ref 4.0–10.5)
nRBC: 0 % (ref 0.0–0.2)

## 2019-02-11 MED ORDER — METHYLPREDNISOLONE SODIUM SUCC 40 MG IJ SOLR
INTRAMUSCULAR | Status: AC
  Administered 2019-02-11: 40 mg via INTRAVENOUS
  Filled 2019-02-11: qty 1

## 2019-02-11 MED ORDER — ACETAMINOPHEN 325 MG PO TABS
650.0000 mg | ORAL_TABLET | Freq: Once | ORAL | Status: DC
  Administered 2019-02-11: 09:00:00 650 mg via ORAL

## 2019-02-11 MED ORDER — DIPHENHYDRAMINE HCL 50 MG/ML IJ SOLN
INTRAMUSCULAR | Status: AC
  Administered 2019-02-11: 09:00:00 50 mg via INTRAVENOUS
  Filled 2019-02-11: qty 1

## 2019-02-11 MED ORDER — DIPHENHYDRAMINE HCL 50 MG/ML IJ SOLN
50.0000 mg | Freq: Once | INTRAMUSCULAR | Status: DC
  Administered 2019-02-11: 09:00:00 50 mg via INTRAVENOUS

## 2019-02-11 MED ORDER — SODIUM CHLORIDE 0.9 % IV SOLN
INTRAVENOUS | Status: DC
  Administered 2019-02-11: 09:00:00 via INTRAVENOUS

## 2019-02-11 MED ORDER — METHYLPREDNISOLONE SODIUM SUCC 40 MG IJ SOLR
40.0000 mg | Freq: Once | INTRAMUSCULAR | Status: DC
  Administered 2019-02-11: 09:00:00 40 mg via INTRAVENOUS

## 2019-02-11 MED ORDER — SODIUM CHLORIDE 0.9 % IV SOLN
7.5000 mg/kg | INTRAVENOUS | Status: DC
  Administered 2019-02-11: 09:00:00 700 mg via INTRAVENOUS
  Filled 2019-02-11: qty 70

## 2019-02-11 MED ORDER — ACETAMINOPHEN 325 MG PO TABS
ORAL_TABLET | ORAL | Status: AC
  Administered 2019-02-11: 650 mg via ORAL
  Filled 2019-02-11: qty 2

## 2019-02-25 ENCOUNTER — Telehealth: Payer: Self-pay | Admitting: Internal Medicine

## 2019-02-25 NOTE — Telephone Encounter (Signed)
smartphone/ my chart active/ consent/ pre reg completed

## 2019-03-03 ENCOUNTER — Encounter: Payer: Self-pay | Admitting: Internal Medicine

## 2019-03-03 ENCOUNTER — Telehealth (INDEPENDENT_AMBULATORY_CARE_PROVIDER_SITE_OTHER): Payer: 59 | Admitting: Internal Medicine

## 2019-03-03 ENCOUNTER — Other Ambulatory Visit: Payer: Self-pay

## 2019-03-03 VITALS — Ht 68.0 in | Wt 195.0 lb

## 2019-03-03 DIAGNOSIS — E782 Mixed hyperlipidemia: Secondary | ICD-10-CM | POA: Diagnosis not present

## 2019-03-03 DIAGNOSIS — Z8249 Family history of ischemic heart disease and other diseases of the circulatory system: Secondary | ICD-10-CM

## 2019-03-03 DIAGNOSIS — R931 Abnormal findings on diagnostic imaging of heart and coronary circulation: Secondary | ICD-10-CM

## 2019-03-03 DIAGNOSIS — E7841 Elevated Lipoprotein(a): Secondary | ICD-10-CM

## 2019-03-03 MED ORDER — REPATHA SURECLICK 140 MG/ML ~~LOC~~ SOAJ: 1.0000 | SUBCUTANEOUS | 11 refills | Status: DC

## 2019-03-03 NOTE — Progress Notes (Signed)
Virtual Visit via Video Note   This visit type was conducted due to national recommendations for restrictions regarding the COVID-19 Pandemic (e.g. social distancing) in an effort to limit this patient's exposure and mitigate transmission in our community.  Due to his co-morbid illnesses, this patient is at least at moderate risk for complications without adequate follow up.  This format is felt to be most appropriate for this patient at this time.  All issues noted in this document were discussed and addressed.  A limited physical exam was performed with this format.  Please refer to the patient's chart for his consent to telehealth for [facility].   Evaluation Performed: Doximity video follow-up  Date:  03/03/2019   ID:  Jose [name], DOB 01-11-1971, MRN [id number]  Patient Location:  [place] [id number]  Provider location:   [address] [place], [place] [address]  PCP:  [name], DO  Cardiologist:  [name], MD Electrophysiologist:  None   Chief Complaint:  No complaints  History of Present Illness:    Jose [name] is a 48 y.o. male who presents via audio/video conferencing for a telehealth visit today.  Mr. Gough was seen today for video follow-up.  He is a patient of 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  There is a significant family history of heart disease in his parents and a brother.  Recently a lipid NMR showed elevated particle numbers and LDL C of a 122.  APO B levels were also elevated.  He had an LPA which was elevated at 112.  He did not tolerate statins due to significant myalgias.  I therefore recommended PCSK9 inhibitor therapy.  He was very reluctant to start that.  Did obtain a calcium score which was the abnormal with a CAC of 22.  This was 77th percentile for age and sex matched control.  Based on these findings there is more  evidence to consider aggressive therapy since he has early onset heart disease.  The patient does not have symptoms concerning for COVID-19 infection (fever, chills, cough, or new SHORTNESS OF BREATH).    Prior CV studies:   The following studies were reviewed today:  Coronary calcium score  PMHx:  Past Medical History:  Diagnosis Date   Crohn's disease ([place])    Hyperlipidemia    Thrombophlebitis     No past surgical history on file.  FAMHx:  Family History  Problem Relation Age of Onset   Heart disease Mother     SOCHx:   reports that he has been smoking cigarettes. He has been smoking about 0.50 packs per day. He has never used smokeless tobacco. He reports that he does not drink alcohol or use drugs.  ALLERGIES:  Allergies  Allergen Reactions   Statins Other (See Comments)    Chest pain    MEDS:  Current Meds  Medication Sig   InFLIXimab (REMICADE IV) Inject into the vein every 8 (eight) weeks.     omeprazole (PRILOSEC) 20 MG capsule Take 20 mg by mouth daily as needed for heartburn.   XARELTO 20 MG TABS tablet TAKE 1 TABLET (20 MG TOTAL) DAILY WITH SUPPER BY MOUTH   Current Facility-Administered Medications for the 03/03/19 encounter (Telemedicine) with Pixie Casino, MD  Medication   methylPREDNISolone acetate (DEPO-MEDROL) injection 20 mg     ROS: Pertinent items noted in HPI and remainder of  comprehensive ROS otherwise negative.  Labs/Other Tests and Data Reviewed:    Recent Labs: 02/11/2019: ALT 25; BUN 18; Creatinine, Ser 1.10; Hemoglobin 14.4; Platelets 176; Potassium 4.1; Sodium 134   Recent Lipid Panel Lab Results  Component Value Date/Time   CHOL 207 (H) 02/03/2019 04:25 PM   TRIG 141 02/03/2019 04:25 PM   HDL 36 (L) 02/03/2019 04:25 PM   CHOLHDL 5.8 (H) 02/03/2019 04:25 PM   CHOLHDL 2.6 03/14/2016 09:01 AM   LDLCALC 143 (H) 02/03/2019 04:25 PM    Wt Readings from Last 3 Encounters:  03/03/19 195 lb (88.5 kg)  02/11/19 195  lb (88.5 kg)  12/17/18 195 lb (88.5 kg)     Exam:    Vital Signs:  Ht 5' 8"  (1.727 m)    Wt 195 lb (88.5 kg)    BMI 29.65 kg/m    General appearance: alert and no distress Lungs: No visual respiratory difficulty Abdomen: Normal weight Extremities: extremities normal, atraumatic, no cyanosis or edema Skin: Skin color, texture, turgor normal. No rashes or lesions Neurologic: Mental status: Alert, oriented, thought content appropriate Psych: Pleasant  ASSESSMENT & PLAN:    1. Dyslipidemia with elevated LP(a) 2. Strong family history of premature coronary disease 3. Statin intolerance 4. Elevated CAC score 22 (10/2018)  Mr. Lattin has dyslipidemia with elevated LP(a), a strong family history of premature coronary disease and early onset coronary artery disease with mildly elevated calcium score.  Based on these findings I am strongly recommending therapy with a PCSK9 inhibitor.  We discussed this extensively for close to 30 minutes today about the risks, benefits, trial data and alternatives to therapy and he is now agreeable to proceed with Repatha.  I would recommend the 140 mg dose every 2 weeks.  We will apply for patient assistance as well as a prior authorization for the medication.  This is expected to take between 2 to 4 weeks and we will plan to repeat a lipid profile approximately 3 weeks after he starts the medication.  COVID-19 Education: The signs and symptoms of COVID-19 were discussed with the patient and how to seek care for testing (follow up with PCP or arrange E-visit).  The importance of social distancing was discussed today.  Patient Risk:   After full review of this patients clinical status, I feel that they are at least moderate risk at this time.  Time:   Today, I have spent 30 minutes with the patient with telehealth technology discussing coronary artery calcium score, elevated LPA, PCSK9 inhibitor mechanisms of action, clinical trial data, research  alternatives.     Medication Adjustments/Labs and Tests Ordered: Current medicines are reviewed at length with the patient today.  Concerns regarding medicines are outlined above.   Tests Ordered: Orders Placed This Encounter  Procedures   Lipid panel    Medication Changes: Meds ordered this encounter  Medications   Evolocumab (REPATHA SURECLICK) 300 MG/ML SOAJ    Sig: Inject 1 Dose into the skin every 14 (fourteen) days.    Dispense:  2 pen    Refill:  11    Disposition:  in 4 month(s)  Pixie Casino, MD, [facility], [place] Director of the Advanced Lipid Disorders &  Cardiovascular Risk Reduction Clinic Diplomate of the American Board of Clinical Lipidology Attending Cardiologist  Direct Dial: [phone]   Fax: [phone]  Website:  [email or web address]  Pixie Casino, MD  03/03/2019 10:46 AM

## 2019-03-03 NOTE — Patient Instructions (Signed)
Medication Instructions:  Dr. Debara Pickett recommends Repatha 140mg /mL - 1 injection every 14 days (PCSK9). This prescription has been [facility]. If a prior authorization is needed, your pharmacy will notify our office and we will submit information to your insurance company for approval.   You can obtain a co-pay card for the medications from [email or web address] >> paying for repatha  If you need a refill on your cardiac medications before your next appointment, please call your pharmacy.   Lab work: FASTING lab work in 4 months to check cholesterol (prior to next visit) If you have labs (blood work) drawn today and your tests are completely normal, you will receive your results only by: Marland Kitchen MyChart Message (if you have MyChart) OR . A paper copy in the mail If you have any lab test that is abnormal or we need to change your treatment, we will call you to review the results.  Testing/Procedures: NONE  Follow-Up: Dr. Debara Pickett recommends that you schedule a follow up visit with him the in the [place] in 4 months. Please have fasting blood work about 1 week prior to this visit and he will review the blood work results with you at your appointment.

## 2019-03-04 ENCOUNTER — Telehealth: Payer: Self-pay | Admitting: Internal Medicine

## 2019-03-04 NOTE — Telephone Encounter (Signed)
We reviewed all of the information you and/or your doctor sent to [place] and sent the information to an appropriate physician specialist if needed. Unfortunately, we must deny coverage for Repatha Sureclick.  Why was my request denied? This request was denied because you did not meet the following clinical requirements: Based on the information provided, you do not meet the established medication-specific criteria or guidelines for Repatha Sureclick at this time. Please discuss alternative drug therapy with your doctor/ Plan.  The request for coverage for Repatha 165m/mL, (2 per month), is denied. This decision is based on health plan criteria for Repatha. This medicine is covered only if: You have atherosclerotic cardiovascular disease (ASCVD) as confirmed by one of the following: (A) Acute coronary syndromes. (B) History of myocardial infarction. (C) Stable or unstable angina. (D) Coronary or other arterial revascularization. (E) Stroke. (F) Transient ischemic attack. (G) Peripheral arterial disease presumed to be of atherosclerotic origin. The information provided does not show that you meet the criteria listed above. This case was reviewed by LKathrin Greathouse For questions regarding this determination, the provider may call ([phone]   [employer] [address] [address]Fax: [phone]

## 2019-03-04 NOTE — Telephone Encounter (Signed)
PA for repatha sureclick submitted via [email or web address] Key: [facility]

## 2019-03-08 ENCOUNTER — Telehealth: Payer: Self-pay

## 2019-03-08 ENCOUNTER — Telehealth: Payer: Self-pay | Admitting: *Deleted

## 2019-03-08 MED ORDER — REPATHA SURECLICK 140 MG/ML ~~LOC~~ SOAJ: 1.0000 | SUBCUTANEOUS | 11 refills | Status: DC

## 2019-03-08 NOTE — Telephone Encounter (Signed)
Prior Auth was approved for Rx [employer]

## 2019-03-08 NOTE — Telephone Encounter (Signed)
Call placed to pt off recall list to schedule 6 mo f/u. Pt scheduled with Kathyrn Drown 03/14/2019 Pt answered no to all covid19 prescreen questions.

## 2019-03-11 ENCOUNTER — Telehealth: Payer: Self-pay | Admitting: Cardiology

## 2019-03-11 NOTE — Telephone Encounter (Signed)
Not needed

## 2019-03-14 ENCOUNTER — Ambulatory Visit: Payer: 59 | Admitting: Cardiology

## 2019-04-01 ENCOUNTER — Telehealth: Payer: Self-pay | Admitting: Cardiology

## 2019-04-01 NOTE — Telephone Encounter (Signed)
New Message ° ° ° °Left message to confirm appt and answer covid questions  °

## 2019-04-01 NOTE — Telephone Encounter (Signed)
New Message    Pt says he had COVID testing done and the results have not came back yet. He does have Symptoms, stuffy nose, loss of taste and smell, no fever so far.  Two of patients kids has tested POS for COVID  Pt would like to only see Dr [name] and not a PA and would like to do a visit as Telehealth    Please call

## 2019-04-01 NOTE — Telephone Encounter (Signed)
I spoke with pt and changed appointment to video visit on July 20,2020. He has scales, BP cuff and pulse ox and will have information from these ready prior to appt. Will send consent through my chart.

## 2019-04-04 ENCOUNTER — Telehealth (INDEPENDENT_AMBULATORY_CARE_PROVIDER_SITE_OTHER): Payer: 59 | Admitting: Cardiology

## 2019-04-04 ENCOUNTER — Encounter: Payer: Self-pay | Admitting: Cardiology

## 2019-04-04 ENCOUNTER — Other Ambulatory Visit: Payer: Self-pay

## 2019-04-04 VITALS — BP 128/76 | HR 76 | Ht 68.0 in | Wt 189.0 lb

## 2019-04-04 DIAGNOSIS — Z8249 Family history of ischemic heart disease and other diseases of the circulatory system: Secondary | ICD-10-CM

## 2019-04-04 DIAGNOSIS — E782 Mixed hyperlipidemia: Secondary | ICD-10-CM

## 2019-04-04 DIAGNOSIS — E7841 Elevated Lipoprotein(a): Secondary | ICD-10-CM

## 2019-04-04 DIAGNOSIS — R931 Abnormal findings on diagnostic imaging of heart and coronary circulation: Secondary | ICD-10-CM

## 2019-04-04 NOTE — Patient Instructions (Signed)
Medication Instructions:  The current medical regimen is effective;  continue present plan and medications.  If you need a refill on your cardiac medications before your next appointment, please call your pharmacy.   Follow-Up: At [facility], you and your health needs are our priority.  As part of our continuing mission to provide you with exceptional heart care, we have created designated Provider Care Teams.  These Care Teams include your primary Cardiologist (physician) and Advanced Practice Providers (APPs -  Physician Assistants and Nurse Practitioners) who all work together to provide you with the care you need, when you need it. You will need a follow up appointment in 6 months.  Please call our office 2 months in advance to schedule this appointment.  You may see [name], MD or one of the following Advanced Practice Providers on your designated Care Team:   Truitt Merle, NP Cecilie Kicks, NP . Kathyrn Drown, NP  Thank you for choosing [facility]!!

## 2019-04-04 NOTE — Progress Notes (Signed)
Virtual Visit via Video Note   This visit type was conducted due to national recommendations for restrictions regarding the COVID-19 Pandemic (e.g. social distancing) in an effort to limit this patient's exposure and mitigate transmission in our community.  Due to his co-morbid illnesses, this patient is at least at moderate risk for complications without adequate follow up.  This format is felt to be most appropriate for this patient at this time.  All issues noted in this document were discussed and addressed.  A limited physical exam was performed with this format.  Please refer to the patient's chart for his consent to telehealth for [facility].   Date:  04/04/2019   ID:  Jose [name], DOB 04-06-1971, MRN [id number]  Patient Location: Home Provider Location: Home  PCP:  [name], DO  Cardiologist:  [name], MD  Electrophysiologist:  None   Evaluation Performed:  Follow-Up Visit  Chief Complaint: Hyperlipidemia follow-up  History of Present Illness:    Jose [name] is a 48 y.o. male with hyperlipidemia currently on Repatha.  He messaged to Dr. Debara Pickett on 03/28/2019 and stated that he took the first shot of Repatha on past Saturday and today experienced bad upper back pain with body aches and weakness.  He wondered if this was normal.  He read the side effects of the medication and does not mention back pain and he was wondering if there was anything he should do for this.  He was questioning if he would be experiencing the side effect every time he took the shot.  Dr. Debara Pickett responded with this could be due to the medication-typically last 24 to 72 hours but can be up to 1 week.  If it resolves, then he recommended that he would do the next injection when it is due.  If he continues to have the same side effects then Dr. Debara Pickett suggested that he would have to go into another direction.  Past medical history includes superficial thrombophlebitis after  travel, on Xarelto,  Crohn's disease on Remicade and dyslipidemia.  Significant family history of heart disease with his parents and brother.  Brother had bypass surgery. He quit smoking in 2017.  LDL-C 122.  April be levels elevated.  LPA elevated at 112.  Could not tolerate statins due to myalgias.  PCSK9 inhibitor, Repatha was recommended.  Calcium score CT showed 22.  77th percentile.  Tested positive COVID-19 infection - 10 days. Loss smell, taste, joint pain, weakness, back pain, shoulder, mild fever. Stuffiness. Had mild SOB for half day. No meds. Vit B12, Vit C. (fever, chills, cough, or new shortness of breath). Wife and kids have it as well. Wife feels about the same as he does.   Wife and 2 boys visited in [place] and got Covid.  He understands to seek medical attention immediately if he starts to develop shortness of breath  Tried Repatha once. Does not want to go through again.   Wanted to try Niacin and Welchol again.   During the video visit, we had to transition to telephone because of loss of video quality.   Past Medical History:  Diagnosis Date  . Crohn's disease ([place])   . Hyperlipidemia   . Thrombophlebitis    No past surgical history on file.   Current Meds  Medication Sig  . Evolocumab (REPATHA SURECLICK) 786 MG/ML SOAJ Inject 1 Dose into the skin every 14 (fourteen) days.  . InFLIXimab (REMICADE IV) Inject into the vein every 8 (  eight) weeks.    Marland Kitchen omeprazole (PRILOSEC) 20 MG capsule Take 20 mg by mouth daily as needed for heartburn.  Alveda Reasons 20 MG TABS tablet TAKE 1 TABLET (20 MG TOTAL) DAILY WITH SUPPER BY MOUTH   Current Facility-Administered Medications for the 04/04/19 encounter (Telemedicine) with Jerline Pain, MD  Medication  . methylPREDNISolone acetate (DEPO-MEDROL) injection 20 mg     Allergies:   Statins   Social History   Tobacco Use  . Smoking status: Current Every Day Smoker    Packs/day: 0.50    Types: Cigarettes  .  Smokeless tobacco: Never Used  Substance Use Topics  . Alcohol use: No  . Drug use: No     Family Hx: The patient's family history includes Heart disease in his mother.  ROS:   Please see the history of present illness.    No fevers chills nausea vomiting syncope bleeding All other systems reviewed and are negative.   Prior CV studies:   The following studies were reviewed today:  Nuclear stress test 2016-low risk no ischemia 10 minutes and 45 seconds exercise time.  Labs/Other Tests and Data Reviewed:    EKG:  Normal sinus rhythm no changes  Recent Labs: 02/11/2019: ALT 25; BUN 18; Creatinine, Ser 1.10; Hemoglobin 14.4; Platelets 176; Potassium 4.1; Sodium 134   Recent Lipid Panel Lab Results  Component Value Date/Time   CHOL 207 (H) 02/03/2019 04:25 PM   TRIG 141 02/03/2019 04:25 PM   HDL 36 (L) 02/03/2019 04:25 PM   CHOLHDL 5.8 (H) 02/03/2019 04:25 PM   CHOLHDL 2.6 03/14/2016 09:01 AM   LDLCALC 143 (H) 02/03/2019 04:25 PM    Wt Readings from Last 3 Encounters:  04/04/19 189 lb (85.7 kg)  03/03/19 195 lb (88.5 kg)  02/11/19 195 lb (88.5 kg)     Objective:    Vital Signs:  BP 128/76   Pulse 76   Ht 5' 8"  (1.727 m)   Wt 189 lb (85.7 kg)   BMI 28.74 kg/m    VITAL SIGNS:  reviewed GEN:  no acute distress EYES:  sclerae anicteric, EOMI - Extraocular Movements Intact RESPIRATORY:  normal respiratory effort, symmetric expansion SKIN:  no rash, lesions or ulcers. MUSCULOSKELETAL:  no obvious deformities. NEURO:  alert and oriented x 3, no obvious focal deficit PSYCH:  normal affect  ASSESSMENT & PLAN:    Dyslipidemia with elevated LPA - Trying to continue Repatha.  Back pain side effect as above.  Appreciate Dr. Lysbeth Penner assistance.  Statin intolerance - Multiple statins utilized, unsuccessfully.  Coronary atherosclerosis with elevated calcium score of 22 - CT scan performed on 10/2018.  Continue with aggressive risk factor modification given his strong  family history of CAD.  COVID-19 Education: The signs and symptoms of COVID-19 were discussed with the patient and how to seek care for testing (follow up with PCP or arrange E-visit).  The importance of social distancing was discussed today.  He is currently hopefully on the tail end of his COVID-19 infectious symptoms.  Time:   Today, I have spent 12 minutes with the patient with telehealth technology discussing the above problems.     Medication Adjustments/Labs and Tests Ordered: Current medicines are reviewed at length with the patient today.  Concerns regarding medicines are outlined above.   Tests Ordered: No orders of the defined types were placed in this encounter.   Medication Changes: No orders of the defined types were placed in this encounter.   Follow Up:  Virtual  Visit or In Person in 6 month(s)  Signed, [name], MD  04/04/2019 9:06 AM    [place]

## 2019-04-08 ENCOUNTER — Encounter (HOSPITAL_COMMUNITY): Payer: 59

## 2019-04-18 ENCOUNTER — Telehealth: Payer: Self-pay

## 2019-04-18 NOTE — Telephone Encounter (Signed)
Contacted patient and left message that appointment on 8/7 has been changed to a phone visit.

## 2019-04-18 NOTE — Telephone Encounter (Signed)
-----   Message from Wyatt Portela, MD sent at 04/18/2019 10:11 AM EDT ----- Regarding: RE: appt Ok. Let him know that I will call him same date and time.  ----- Message ----- From: Scot Dock, RN Sent: 04/18/2019  10:00 AM EDT To: Wyatt Portela, MD Subject: appt                                           He is requesting that his appt on 8/7 be changed to a virtual visit.

## 2019-04-22 ENCOUNTER — Telehealth: Payer: Self-pay | Admitting: Oncology

## 2019-04-22 ENCOUNTER — Inpatient Hospital Stay: Payer: 59 | Attending: Oncology | Admitting: Oncology

## 2019-04-22 ENCOUNTER — Encounter (HOSPITAL_COMMUNITY): Payer: 59

## 2019-04-22 DIAGNOSIS — I2699 Other pulmonary embolism without acute cor pulmonale: Secondary | ICD-10-CM

## 2019-04-22 NOTE — Progress Notes (Signed)
Hematology and Oncology Follow Up for Telemedicine Visits  DESHAN HEMMELGARN [id number] [birth year] 48 y.o. 04/22/2019 8:06 AM [place], Crosby Oyster, DOWendling, Crosby Oyster*   I connected with Mr. Sistrunk on 04/22/19 at  8:30 AM EDT by video enabled telemedicine visit and verified that I am speaking with the correct person using two identifiers.   I discussed the limitations, risks, security and privacy concerns of performing an evaluation and management service by telemedicine and the availability of in-person appointments. I also discussed with the patient that there may be a patient responsible charge related to this service. The patient expressed understanding and agreed to proceed.  Other persons participating in the visit and their role in the encounter:  None  Patient's location:  Home Provider's location:  Office.    Principle Diagnosis: 48 year old man with:  1. 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    2. Pulmonary embolism without any documented inherited thrombophilia diagnosed in 2018.   Current therapy: Xarelto since in November 2018.  Duration of anticoagulation is indefinite for the time being.    Interim History: He reports no major changes in his health.  He has fully recovered from COVID-19 after he and his family were infected.  He had predominantly mild symptoms and did not require any hospitalization.  Symptoms were predominantly fever and congestion.  His symptoms have resolved at this time and he has resumed all activities of daily living.  He had no issues with the Xarelto at this time.  He denies any bleeding or thrombosis episodes.  Patient denied any alteration mental status, neuropathy, confusion or dizziness.  Denies any headaches or lethargy.  Denies any night sweats, weight loss or changes in appetite.  Denied orthopnea, dyspnea on exertion or chest discomfort.  Denies shortness of breath, difficulty  breathing hemoptysis or cough.  Denies any abdominal distention, nausea, early satiety or dyspepsia.  Denies any hematuria, frequency, dysuria or nocturia.  Denies any skin irritation, dryness or rash.  Denies any ecchymosis or petechiae.  Denies any lymphadenopathy or clotting.  Denies any heat or cold intolerance.  Denies any anxiety or depression.  Remaining review of system is negative.        Medications: Updated on review. Current Outpatient Medications  Medication Sig Dispense Refill  . Evolocumab (REPATHA SURECLICK) 397 MG/ML SOAJ Inject 1 Dose into the skin every 14 (fourteen) days. 2 pen 11  . InFLIXimab (REMICADE IV) Inject into the vein every 8 (eight) weeks.      Marland Kitchen omeprazole (PRILOSEC) 20 MG capsule Take 20 mg by mouth daily as needed for heartburn.  5  . XARELTO 20 MG TABS tablet TAKE 1 TABLET (20 MG TOTAL) DAILY WITH SUPPER BY MOUTH 90 tablet 1   Current Facility-Administered Medications  Medication Dose Route Frequency Provider Last Rate Last Dose  . methylPREDNISolone acetate (DEPO-MEDROL) injection 20 mg  20 mg Intramuscular Once [name], DO         Allergies:  Allergies  Allergen Reactions  . Statins Other (See Comments)    Chest pain    Past Medical History, Surgical history, Social history, and Family History without any change on review.     Lab Results: Lab Results  Component Value Date   WBC 5.7 02/11/2019   HGB 14.4 02/11/2019   HCT 43.9 02/11/2019   MCV 87.3 02/11/2019   PLT 176 02/11/2019     Chemistry      Component Value Date/Time  NA 134 (L) 02/11/2019 0846   K 4.1 02/11/2019 0846   CL 104 02/11/2019 0846   CO2 23 02/11/2019 0846   BUN 18 02/11/2019 0846   CREATININE 1.10 02/11/2019 0846      Component Value Date/Time   CALCIUM 9.0 02/11/2019 0846   ALKPHOS 64 02/11/2019 0846   AST 24 02/11/2019 0846   ALT 25 02/11/2019 0846   BILITOT 0.3 02/11/2019 0846   BILITOT 0.3 03/19/2018 0817       Radiological  Studies: Summary: Right: Findings appear improved from previous examination of 07/18/17 . There is no evidence of deep vein thrombosis in the lower extremity.There is no evidence of superficial venous thrombosis. No cystic structure found in the popliteal fossa. Left: There is no evidence of deep vein thrombosis in the lower extremity.There is no evidence of superficial venous thrombosis. No cystic structure found in the popliteal fossa.   IMPRESSION: 1.9 cm benign Bosniak category 2 cyst in lower pole of left kidney, which corresponds to the lesion seen on recent CT. No evidence of renal neoplasm or other significant abnormality.   Impression and Plan:  48 year old man with:  1.  Recurrent thromboembolism: He presented with a superficial phlebitis on multiple occasions and diagnosed with pulmonary embolism and 2018.    Currently on full dose anticoagulation with Xarelto without any major complications.  Risks and benefits of continuing this therapy long-term was reviewed.  Etiology of his thrombosis remains unclear could be related to chronic inflammatory bowel disease versus undiagnosed thrombophilia.  Long-term complications associated with Xarelto reiterated including bleeding among other issues.  We also discussed the role of a reduced dose Xarelto for maintenance purposes moving forward.  After discussion we have elected to continue with full dose and reassess in 6 months.   2.  Kidney mass: MRI of the abdomen obtained in November 2018 showed cystic component without neoplasm.  3.  Follow-up: Will be in 6 months for repeat evaluation.   I discussed the assessment and treatment plan with the patient. The patient was provided an opportunity to ask questions and all were answered. The patient agreed with the plan and demonstrated an understanding of the instructions.   The patient was advised to call back or seek an in-person evaluation if the symptoms worsen or if the condition  fails to improve as anticipated.  I provided 15 minutes of face-to-face video visit time during this encounter, and > 50% was dedicated to reviewing the imaging studies, his disease status update, treatment options and complications related to therapy.  Zola Button, MD 04/22/2019 8:06 AM

## 2019-04-22 NOTE — Telephone Encounter (Signed)
Spoke with patient re February 2021 appointments.

## 2019-04-29 ENCOUNTER — Other Ambulatory Visit: Payer: Self-pay

## 2019-04-29 ENCOUNTER — Ambulatory Visit (HOSPITAL_COMMUNITY)
Admission: RE | Admit: 2019-04-29 | Discharge: 2019-04-29 | Disposition: A | Discharge status: Home / Self Care | Payer: 59 | Source: Ambulatory Visit | Attending: Gastroenterology | Admitting: Gastroenterology

## 2019-04-29 DIAGNOSIS — K5090 Crohn's disease, unspecified, without complications: Secondary | ICD-10-CM | POA: Diagnosis present

## 2019-04-29 MED ORDER — SODIUM CHLORIDE 0.9 % IV SOLN
7.5000 mg/kg | INTRAVENOUS | Status: DC
  Administered 2019-04-29: 600 mg via INTRAVENOUS
  Filled 2019-04-29: qty 60

## 2019-04-29 MED ORDER — ACETAMINOPHEN 325 MG PO TABS
ORAL_TABLET | ORAL | Status: AC
  Administered 2019-04-29: 650 mg
  Filled 2019-04-29: qty 2

## 2019-04-29 MED ORDER — METHYLPREDNISOLONE SODIUM SUCC 40 MG IJ SOLR: 40.0000 mg | Freq: Once | INTRAMUSCULAR | Status: DC

## 2019-04-29 MED ORDER — METHYLPREDNISOLONE SODIUM SUCC 40 MG IJ SOLR
INTRAMUSCULAR | Status: AC
  Administered 2019-04-29: 40 mg
  Filled 2019-04-29: qty 1

## 2019-04-29 MED ORDER — SODIUM CHLORIDE 0.9 % IV SOLN: INTRAVENOUS | Status: DC

## 2019-04-29 MED ORDER — ACETAMINOPHEN 325 MG PO TABS: 650.0000 mg | ORAL_TABLET | Freq: Once | ORAL | Status: DC

## 2019-04-29 MED ORDER — DIPHENHYDRAMINE HCL 50 MG/ML IJ SOLN
INTRAMUSCULAR | Status: AC
  Administered 2019-04-29: 50 mg
  Filled 2019-04-29: qty 1

## 2019-04-29 MED ORDER — DIPHENHYDRAMINE HCL 50 MG/ML IJ SOLN: 50.0000 mg | Freq: Once | INTRAMUSCULAR | Status: DC

## 2019-05-22 ENCOUNTER — Other Ambulatory Visit: Payer: Self-pay | Admitting: Oncology

## 2019-06-01 ENCOUNTER — Encounter: Payer: Self-pay | Admitting: Internal Medicine

## 2019-06-01 ENCOUNTER — Ambulatory Visit: Payer: 59 | Admitting: Internal Medicine

## 2019-06-01 ENCOUNTER — Other Ambulatory Visit: Payer: Self-pay

## 2019-06-01 VITALS — BP 134/81 | HR 74 | Ht 68.0 in | Wt 192.4 lb

## 2019-06-01 DIAGNOSIS — E7841 Elevated Lipoprotein(a): Secondary | ICD-10-CM

## 2019-06-01 DIAGNOSIS — E782 Mixed hyperlipidemia: Secondary | ICD-10-CM | POA: Diagnosis not present

## 2019-06-01 DIAGNOSIS — R931 Abnormal findings on diagnostic imaging of heart and coronary circulation: Secondary | ICD-10-CM | POA: Diagnosis not present

## 2019-06-01 DIAGNOSIS — Z789 Other specified health status: Secondary | ICD-10-CM

## 2019-06-01 MED ORDER — NEXLETOL 180 MG PO TABS: 1.0000 | ORAL_TABLET | Freq: Every day | ORAL | 3 refills | Status: DC

## 2019-06-01 NOTE — Patient Instructions (Signed)
Medication Instructions:  START nexletol 19m daily  If you need a refill on your cardiac medications before your next appointment, please call your pharmacy.   Lab work: FASTING lab work in 3 month to check cholesterol If you have labs (blood work) drawn today and your tests are completely normal, you will receive your results only by: .Marland KitchenMyChart Message (if you have MyChart) OR . A paper copy in the mail If you have any lab test that is abnormal or we need to change your treatment, we will call you to review the results.   Follow-Up: Dr. HDebara Pickettrecommends that you schedule a follow [place] visit with him the [place]. Please have fasting blood work about 1 week prior to this visit and he will review the blood work results with you at your appointment.

## 2019-06-01 NOTE — Progress Notes (Addendum)
LIPID CLINIC CONSULT NOTE  Chief Complaint: Follow-up dyslipidemia  Primary Care Physician: [name], DO  Primary Cardiologist:  [name], MD  HPI:  Jose [name] is a 48 y.o. male who is being seen today for the evaluation of dyslipidemia at the request of [name]. This is a pleasant 48 year old male patient of [name] with a history of DVT and PE, Crohn's disease on Remicade, and dyslipidemia.  He also has a significant family history of coronary disease.  In fact his dad died at age 71 of an MI and his mother died of a stroke in her 6s.  He has a brother who had a stent and bypass surgery at age 96.  He has been followed closely by [name] for cardiovascular risk reduction.  He has been noted to have elevated cholesterol in the past and wants to be proactive about this as he has 5 boys at home.  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  His most recent lipid profile was an NMR study which showed LDL-P of 1641, triglycerides 131, HDL 31 and LDL-C of 122.  In Apo(B) level was assessed at 117.  In addition he was found to have elevated LP(a) of 112.  Unfortunately he has had side effects to statins in the past.  He has been on a number of different statin medications which caused pain across his chest and upper back between his shoulder blades.  Infected center to the hospital once.  After stopping the medications his symptoms have resolved.  Currently he is not on any therapy for his dyslipidemia but was wondering what other options may be available.  Previously had been tried on [employer] and niacin.  At one point he was taking Niaspan 1000 mg daily however did have significant flushing I was not able to increase the dose much further.  He is aware that current studies do not suggest there is significant cardiovascular risk reduction with niacin although there is some evidence it may lower LP(a).  06/01/2019  Mr.  Pasch returns today for follow-up.  Unfortunately he had a significant side effect from [place].  After taking 1 dose he had significant pain across his back and chest which caused him to subsequently discontinue the medicine.  Unfortunately about 2 weeks after that he was diagnosed with COVID.  He does not think the 2 events are related although did have significant symptoms with a viral infection.  He has since recovered.  He is not interested in any additional PCSK9 inhibitor injections or alternatives.  We discussed the fact that the competitive PCSK9 neighbor may have similar side effects.  He is unwilling to take any statins because of the side effects there.  LDL remains above target of less than 70.  We discussed other possible options including ezetimibe which could certainly potentially affect his inflammatory bowel disease, or the recently approved Nexletol.  He understands it there is no clear outcome data on this medication at this point and that neither medication is shown significant reductions in LP(a).  PMHx:  Past Medical History:  Diagnosis Date  . Crohn's disease ([place])   . Hyperlipidemia   . Thrombophlebitis     No past surgical history on file.  FAMHx:  Family History  Problem Relation Age of Onset  . Heart disease Mother     SOCHx:   reports that he has been smoking cigarettes. He has been smoking  about 0.50 packs per day. He has never used smokeless tobacco. He reports that he does not drink alcohol or use drugs.  ALLERGIES:  Allergies  Allergen Reactions  . Statins Other (See Comments)    Chest pain  . Repatha [Evolocumab] Other (See Comments)    Muscle/Back Pain    ROS: Pertinent items noted in HPI and remainder of comprehensive ROS otherwise negative.  HOME MEDS: Current Outpatient Medications on File Prior to Visit  Medication Sig Dispense Refill  . InFLIXimab (REMICADE IV) Inject into the vein every 8 (eight) weeks.      Marland Kitchen omeprazole (PRILOSEC) 20  MG capsule Take 20 mg by mouth daily as needed for heartburn.  5  . XARELTO 20 MG TABS tablet TAKE 1 TABLET (20 MG TOTAL) DAILY WITH SUPPER BY MOUTH 90 tablet 1   Current Facility-Administered Medications on File Prior to Visit  Medication Dose Route Frequency Provider Last Rate Last Dose  . methylPREDNISolone acetate (DEPO-MEDROL) injection 20 mg  20 mg Intramuscular Once [name], DO        LABS/IMAGING: No results found for this or any previous visit (from the past 48 hour(s)). No results found.  LIPID PANEL:    Component Value Date/Time   CHOL 207 (H) 02/03/2019 1625   TRIG 141 02/03/2019 1625   HDL 36 (L) 02/03/2019 1625   CHOLHDL 5.8 (H) 02/03/2019 1625   CHOLHDL 2.6 03/14/2016 0901   VLDL 20 03/14/2016 0901   LDLCALC 143 (H) 02/03/2019 1625    WEIGHTS: Wt Readings from Last 3 Encounters:  06/01/19 192 lb 6.4 oz (87.3 kg)  04/29/19 186 lb (84.4 kg)  04/04/19 189 lb (85.7 kg)    VITALS: BP 134/81   Pulse 74   Ht 5' 8"  (1.727 m)   Wt 192 lb 6.4 oz (87.3 kg)   SpO2 100%   BMI 29.25 kg/m   EXAM: Deferred  EKG: Deferred  ASSESSMENT: 1. Dyslipidemia with elevated LP(a) 2. Strong family history of premature coronary disease 3. Statin intolerance  4. Repatha intolerant  PLAN: 1.   [name] unfortunately is also had a significant side effect, he believes to Repatha causing back pain and muscle pain.  He did not take more than 1 injection of the medication but is also unwilling to take any further injections.  This limits his options significantly.  He does need about 30 to 40% reduction in LDL which might be achievable with some other medications.  We discussed options including ezetimibe which could potentially be an issue with his Crohn's disease, as well as Nexletol. Since his recent LDL was 143 (above 130), he should qualify for Nexletol without a history of ASCVD. Therefore, we have decided to pursue Nexletol 180 mg daily.  If this is  well-tolerated but not yet to target, we will consider adding ezetimibe or the combination product Nexlizet.  Follow-up in 3-4 months with repeat lipids.  Pixie Casino, MD, [facility] [place] Director of the Advanced Lipid Disorders &  Cardiovascular Risk Reduction Clinic Diplomate of the American Board of Clinical Lipidology Attending Cardiologist  Direct Dial: [phone]  Fax: [phone]  Website:  [email or web address] 06/01/2019, 1:04 PM

## 2019-06-23 ENCOUNTER — Other Ambulatory Visit (HOSPITAL_COMMUNITY): Payer: Self-pay | Admitting: *Deleted

## 2019-06-24 ENCOUNTER — Telehealth: Payer: Self-pay | Admitting: Internal Medicine

## 2019-06-24 ENCOUNTER — Ambulatory Visit (HOSPITAL_COMMUNITY)
Admission: RE | Admit: 2019-06-24 | Discharge: 2019-06-24 | Disposition: A | Discharge status: Home / Self Care | Payer: 59 | Source: Ambulatory Visit | Attending: Gastroenterology | Admitting: Gastroenterology

## 2019-06-24 ENCOUNTER — Other Ambulatory Visit: Payer: Self-pay

## 2019-06-24 DIAGNOSIS — Z7901 Long term (current) use of anticoagulants: Secondary | ICD-10-CM | POA: Diagnosis not present

## 2019-06-24 DIAGNOSIS — Z87891 Personal history of nicotine dependence: Secondary | ICD-10-CM | POA: Insufficient documentation

## 2019-06-24 DIAGNOSIS — K5010 Crohn's disease of large intestine without complications: Secondary | ICD-10-CM | POA: Insufficient documentation

## 2019-06-24 DIAGNOSIS — Z86718 Personal history of other venous thrombosis and embolism: Secondary | ICD-10-CM | POA: Insufficient documentation

## 2019-06-24 LAB — COMPREHENSIVE METABOLIC PANEL
ALT: 34 U/L (ref 0–44)
AST: 30 U/L (ref 15–41)
Albumin: 3.7 g/dL (ref 3.5–5.0)
Alkaline Phosphatase: 57 U/L (ref 38–126)
Anion gap: 7 (ref 5–15)
BUN: 13 mg/dL (ref 6–20)
CO2: 24 mmol/L (ref 22–32)
Calcium: 8.9 mg/dL (ref 8.9–10.3)
Chloride: 110 mmol/L (ref 98–111)
Creatinine, Ser: 1.12 mg/dL (ref 0.61–1.24)
GFR calc Af Amer: >60 mL/min (ref >60)
GFR calc non Af Amer: >60 mL/min (ref >60)
Glucose, Bld: 79 mg/dL (ref 70–99)
Potassium: 4.4 mmol/L (ref 3.5–5.1)
Sodium: 141 mmol/L (ref 135–145)
Total Bilirubin: 0.5 mg/dL (ref 0.3–1.2)
Total Protein: 6.8 g/dL (ref 6.5–8.1)

## 2019-06-24 LAB — CBC WITH DIFFERENTIAL/PLATELET
Abs Immature Granulocytes: 0.03 10*3/uL (ref 0.00–0.07)
Basophils Absolute: 0 10*3/uL (ref 0.0–0.1)
Basophils Relative: 1 %
Eosinophils Absolute: 0.3 10*3/uL (ref 0.0–0.5)
Eosinophils Relative: 5 %
HCT: 43.9 % (ref 39.0–52.0)
Hemoglobin: 14.1 g/dL (ref 13.0–17.0)
Immature Granulocytes: 1 %
Lymphocytes Relative: 30 %
Lymphs Abs: 1.8 10*3/uL (ref 0.7–4.0)
MCH: 28.9 pg (ref 26.0–34.0)
MCHC: 32.1 g/dL (ref 30.0–36.0)
MCV: 90 fL (ref 80.0–100.0)
Monocytes Absolute: 0.6 10*3/uL (ref 0.1–1.0)
Monocytes Relative: 10 %
Neutro Abs: 3.4 10*3/uL (ref 1.7–7.7)
Neutrophils Relative %: 53 %
Platelets: 187 10*3/uL (ref 150–400)
RBC: 4.88 MIL/uL (ref 4.22–5.81)
RDW: 13.2 % (ref 11.5–15.5)
WBC: 6.2 10*3/uL (ref 4.0–10.5)
nRBC: 0 % (ref 0.0–0.2)

## 2019-06-24 MED ORDER — SODIUM CHLORIDE 0.9 % IV SOLN
7.5000 mg/kg | INTRAVENOUS | Status: DC
  Administered 2019-06-24: 600 mg via INTRAVENOUS
  Filled 2019-06-24: qty 60

## 2019-06-24 MED ORDER — METHYLPREDNISOLONE SODIUM SUCC 40 MG IJ SOLR
INTRAMUSCULAR | Status: AC
  Administered 2019-06-24: 40 mg
  Filled 2019-06-24: qty 1

## 2019-06-24 MED ORDER — SODIUM CHLORIDE 0.9 % IV SOLN: INTRAVENOUS | Status: DC

## 2019-06-24 MED ORDER — ACETAMINOPHEN 325 MG PO TABS
ORAL_TABLET | ORAL | Status: AC
  Filled 2019-06-24: qty 2

## 2019-06-24 MED ORDER — ACETAMINOPHEN 325 MG PO TABS
650.0000 mg | ORAL_TABLET | Freq: Once | ORAL | Status: AC
  Administered 2019-06-24: 650 mg via ORAL

## 2019-06-24 MED ORDER — DIPHENHYDRAMINE HCL 50 MG/ML IJ SOLN
50.0000 mg | Freq: Once | INTRAMUSCULAR | Status: AC
  Administered 2019-06-24: 09:00:00 50 mg via INTRAVENOUS

## 2019-06-24 MED ORDER — DIPHENHYDRAMINE HCL 50 MG/ML IJ SOLN
INTRAMUSCULAR | Status: AC
  Filled 2019-06-24: qty 1

## 2019-06-24 MED ORDER — METHYLPREDNISOLONE SODIUM SUCC 40 MG IJ SOLR: 40.0000 mg | Freq: Once | INTRAMUSCULAR | Status: DC

## 2019-06-24 NOTE — Telephone Encounter (Signed)
PA for Nexletol received today Submitted via [email or web address] Key: [id number] - PA Case ID: [id number] - Rx #: [id number]

## 2019-06-24 NOTE — Telephone Encounter (Signed)
The request for coverage for Nexletol 174m, use as directed (30 per month), is denied. This decision is based on health plan criteria for Nexletol. This medicine is covered only if: All of the following: (1) You have one of the following diagnoses: (A) Heterozygous familial hypercholesterolemia (HeFH). (B) Atherosclerotic cardiovascular disease (ASCVD). (2) Documentation of one of the following low-density lipoprotein cholesterol values while on maximally tolerated lipid lowering therapy for a minimum of at least 12 weeks within the last 120 days: (I) Low-density lipoprotein cholesterol of 100 mg/dL or greater with atherosclerotic cardiovascular disease. (II) Low-density lipoprotein cholesterol of 130 mg/dL or greater without atherosclerotic cardiovascular disease.  To file an appeal, please send any written comments, documents or other relevant documentation with your appeal to the address listed below:  [facility] [address] Fax: [phone]/Urgent Fax: [phone]

## 2019-07-01 ENCOUNTER — Encounter: Payer: Self-pay | Admitting: *Deleted

## 2019-07-01 ENCOUNTER — Telehealth: Payer: Self-pay | Admitting: Internal Medicine

## 2019-07-01 NOTE — Telephone Encounter (Signed)
Appeal for [facility] faxed with labs, MD note, calcium score test results to  [place] Department          Fax: [phone]

## 2019-07-17 ENCOUNTER — Encounter: Payer: Self-pay | Admitting: Oncology

## 2019-07-18 ENCOUNTER — Other Ambulatory Visit: Payer: Self-pay

## 2019-07-18 ENCOUNTER — Telehealth: Payer: Self-pay

## 2019-07-18 DIAGNOSIS — I2699 Other pulmonary embolism without acute cor pulmonale: Secondary | ICD-10-CM

## 2019-07-18 NOTE — Telephone Encounter (Signed)
-----   Message from Wyatt Portela, MD sent at 07/18/2019  8:25 AM EST ----- Contact: [phone] [facility] 11/3 so I can be here if needed. Thanks ----- Message ----- From: Tami Lin, RN Sent: 07/18/2019   8:12 AM EST To: Wyatt Portela, MD  Patient sent this message through the patient portal on my chart. I talked to him this morning and he states he thinks he has a pulled groin but is also concerned about a blood clot.  He states there is redness, swelling and the area is bruised and painful. He is taking Ibuprofen for the pain. He wants to know if he needs to be seen or if you can call him to discuss.  [facility] Dr Alen Blew  I hope all is well! Yesterday Saturday 10/31 I played soccer and thought I pulled a muscle in my left leg. Today I noticed the marks shown in the attached pictures. There is local pain and discomfort but the area is not warm. After I saw the injury area i am not if this could be a blood clot. I am taking Xarelto on a daily basis and not sure what to this if this is a normal injury or a clot.   Is it possible to make an urgent visit to your office as soon as possible?   Thank you  Jose [name]

## 2019-07-18 NOTE — Telephone Encounter (Signed)
Patient scheduled to be seen in Symptom Management Clinic at 9:30 on Tuesday 07/19/19. Lab at 9:00. Patient aware of appointment and verbalized understanding.

## 2019-07-19 ENCOUNTER — Inpatient Hospital Stay: Payer: 59 | Attending: Oncology | Admitting: Medical

## 2019-07-19 ENCOUNTER — Inpatient Hospital Stay: Payer: 59

## 2019-07-19 ENCOUNTER — Other Ambulatory Visit: Payer: Self-pay

## 2019-07-19 VITALS — BP 128/62 | HR 68 | Temp 98.3°F | Resp 18 | Ht 68.0 in | Wt 196.3 lb

## 2019-07-19 DIAGNOSIS — I2699 Other pulmonary embolism without acute cor pulmonale: Secondary | ICD-10-CM

## 2019-07-19 DIAGNOSIS — T148XXA Other injury of unspecified body region, initial encounter: Secondary | ICD-10-CM | POA: Diagnosis not present

## 2019-07-19 DIAGNOSIS — E785 Hyperlipidemia, unspecified: Secondary | ICD-10-CM | POA: Diagnosis not present

## 2019-07-19 DIAGNOSIS — I809 Phlebitis and thrombophlebitis of unspecified site: Secondary | ICD-10-CM | POA: Diagnosis not present

## 2019-07-19 DIAGNOSIS — F17210 Nicotine dependence, cigarettes, uncomplicated: Secondary | ICD-10-CM | POA: Insufficient documentation

## 2019-07-19 DIAGNOSIS — K5090 Crohn's disease, unspecified, without complications: Secondary | ICD-10-CM | POA: Insufficient documentation

## 2019-07-19 DIAGNOSIS — S7012XA Contusion of left thigh, initial encounter: Secondary | ICD-10-CM | POA: Insufficient documentation

## 2019-07-19 DIAGNOSIS — Z7901 Long term (current) use of anticoagulants: Secondary | ICD-10-CM | POA: Insufficient documentation

## 2019-07-19 LAB — CMP (CANCER CENTER ONLY)
ALT: 42 U/L (ref 0–44)
AST: 30 U/L (ref 15–41)
Albumin: 3.9 g/dL (ref 3.5–5.0)
Alkaline Phosphatase: 59 U/L (ref 38–126)
Anion gap: 9 (ref 5–15)
BUN: 19 mg/dL (ref 6–20)
CO2: 27 mmol/L (ref 22–32)
Calcium: 9.4 mg/dL (ref 8.9–10.3)
Chloride: 104 mmol/L (ref 98–111)
Creatinine: 1.44 mg/dL — ABNORMAL HIGH (ref 0.61–1.24)
GFR, Est AFR Am: >60 mL/min (ref >60)
GFR, Estimated: 56 mL/min — ABNORMAL LOW (ref >60)
Glucose, Bld: 92 mg/dL (ref 70–99)
Potassium: 4.9 mmol/L (ref 3.5–5.1)
Sodium: 140 mmol/L (ref 135–145)
Total Bilirubin: 0.3 mg/dL (ref 0.3–1.2)
Total Protein: 7.1 g/dL (ref 6.5–8.1)

## 2019-07-19 LAB — CBC WITH DIFFERENTIAL (CANCER CENTER ONLY)
Abs Immature Granulocytes: 0.04 10*3/uL (ref 0.00–0.07)
Basophils Absolute: 0 10*3/uL (ref 0.0–0.1)
Basophils Relative: 1 %
Eosinophils Absolute: 0.2 10*3/uL (ref 0.0–0.5)
Eosinophils Relative: 4 %
HCT: 41.7 % (ref 39.0–52.0)
Hemoglobin: 13.8 g/dL (ref 13.0–17.0)
Immature Granulocytes: 1 %
Lymphocytes Relative: 33 %
Lymphs Abs: 2 10*3/uL (ref 0.7–4.0)
MCH: 29.4 pg (ref 26.0–34.0)
MCHC: 33.1 g/dL (ref 30.0–36.0)
MCV: 88.7 fL (ref 80.0–100.0)
Monocytes Absolute: 0.6 10*3/uL (ref 0.1–1.0)
Monocytes Relative: 10 %
Neutro Abs: 3.1 10*3/uL (ref 1.7–7.7)
Neutrophils Relative %: 51 %
Platelet Count: 202 10*3/uL (ref 150–400)
RBC: 4.7 MIL/uL (ref 4.22–5.81)
RDW: 13.2 % (ref 11.5–15.5)
WBC Count: 6.1 10*3/uL (ref 4.0–10.5)
nRBC: 0 % (ref 0.0–0.2)

## 2019-07-19 NOTE — Patient Instructions (Signed)
COVID-19: How to Protect Yourself and Others Know how it spreads  There is currently no vaccine to prevent coronavirus disease 2019 (COVID-19).  The best way to prevent illness is to avoid being exposed to this virus.  The virus is thought to spread mainly from person-to-person. ? Between people who are in close contact with one another (within about 6 feet). ? Through respiratory droplets produced when an infected person coughs, sneezes or talks. ? These droplets can land in the mouths or noses of people who are nearby or possibly be inhaled into the lungs. ? Some recent studies have suggested that COVID-19 may be spread by people who are not showing symptoms. Everyone should Clean your hands often  Wash your hands often with soap and water for at least 20 seconds especially after you have been in a public place, or after blowing your nose, coughing, or sneezing.  If soap and water are not readily available, use a hand sanitizer that contains at least 60% alcohol. Cover all surfaces of your hands and rub them together until they feel dry.  Avoid touching your eyes, nose, and mouth with unwashed hands. Avoid close contact  Stay home if you are sick.  Avoid close contact with people who are sick.  Put distance between yourself and other people. ? Remember that some people without symptoms may be able to spread virus. ? This is especially important for people who are at higher risk of getting very sick.www.cdc.gov/coronavirus/2019-ncov/need-extra-precautions/people-at-higher-risk.html Cover your mouth and nose with a cloth face cover when around others  You could spread COVID-19 to others even if you do not feel sick.  Everyone should wear a cloth face cover when they have to go out in public, for example to the grocery store or to pick up other necessities. ? Cloth face coverings should not be placed on young children under age 2, anyone who has trouble breathing, or is unconscious,  incapacitated or otherwise unable to remove the mask without assistance.  The cloth face cover is meant to protect other people in case you are infected.  Do NOT use a facemask meant for a healthcare worker.  Continue to keep about 6 feet between yourself and others. The cloth face cover is not a substitute for social distancing. Cover coughs and sneezes  If you are in a private setting and do not have on your cloth face covering, remember to always cover your mouth and nose with a tissue when you cough or sneeze or use the inside of your elbow.  Throw used tissues in the trash.  Immediately wash your hands with soap and water for at least 20 seconds. If soap and water are not readily available, clean your hands with a hand sanitizer that contains at least 60% alcohol. Clean and disinfect  Clean AND disinfect frequently touched surfaces daily. This includes tables, doorknobs, light switches, countertops, handles, desks, phones, keyboards, toilets, faucets, and sinks. www.cdc.gov/coronavirus/2019-ncov/prevent-getting-sick/disinfecting-your-home.html  If surfaces are dirty, clean them: Use detergent or soap and water prior to disinfection.  Then, use a household disinfectant. You can see a list of EPA-registered household disinfectants here. cdc.gov/coronavirus 01/18/2019 This information is not intended to replace advice given to you by your health care provider. Make sure you discuss any questions you have with your health care provider. Document Released: 12/28/2018 Document Revised: 01/26/2019 Document Reviewed: 12/28/2018 Elsevier Patient Education  2020 Elsevier Inc.  

## 2019-07-19 NOTE — Progress Notes (Signed)
Symptoms Management Clinic Progress Note   Jose [name] 28-Feb-1971 48 y.o.  Jose [name] is managed by Dr. Zola Button  Actively treated with chemotherapy/immunotherapy/hormonal therapy: yes  Next scheduled appointment with provider: 10/21/2019  Assessment: Plan:    PE (pulmonary thromboembolism) ([place])  Hematoma   Pulmonary emboli: Patient continues on Xarelto.  He has been compliant with this medication and has not missed any doses.  He is scheduled to be seen in follow-up by Dr. Alen Blew on 10/21/2019.  Hematoma of the left medial proximal thigh: The patient was reassured and was told to use ice packs to the area as needed then transition to a heating pad.  He was reassured that the area did not represent a recurrent blood clot and would resolve over the next couple of weeks.  Please see After Visit Summary for patient specific instructions.  Future Appointments  Date Time Provider [place]  08/19/2019  8:00 AM [facility] ROOM 10 [facility] None  08/29/2019  8:45 AM Hilty, Nadean Corwin, MD [facility]  10/21/2019  8:00 AM [facility] LAB 4 [facility] None  10/21/2019  8:30 AM Shadad, Mathis Dad, MD [facility] None    No orders of the defined types were placed in this encounter.      Subjective:   Patient ID:  Jose [name] is a 48 y.o. (DOB 1970/11/20) male.  Chief Complaint:  Chief Complaint  Patient presents with  . Leg Pain    HPI Jose [name]  Is a 48 y.o. male with a diagnosis of a pulmonary thromboembolism who is followed by Dr. Alen Blew.  The patient continues on Xarelto.  He presents to the office today with a tender bruise on his proximal medial left thigh after playing soccer this past weekend.  He is compliant with Xarelto and has not missed any doses.  He reports he is having mild radiation of the pain in his left leg distally.  He has no shortness of breath or chest pain.  The area is not warm to the touch.  He does not  remember any mechanism of injury.  Medications: I have reviewed the patient's current medications.  Allergies:  Allergies  Allergen Reactions  . Statins Other (See Comments)    Chest pain  . Repatha [Evolocumab] Other (See Comments)    Muscle/Back Pain    Past Medical History:  Diagnosis Date  . Crohn's disease ([place])   . Hyperlipidemia   . Thrombophlebitis     No past surgical history on file.  Family History  Problem Relation Age of Onset  . Heart disease Mother     Social History   Socioeconomic History  . Marital status: Married    Spouse name: Not on file  . Number of children: Not on file  . Years of education: Not on file  . Highest education level: Not on file  Occupational History  . Not on file  Social Needs  . Financial resource strain: Not on file  . Food insecurity    Worry: Not on file    Inability: Not on file  . Transportation needs    Medical: Not on file    Non-medical: Not on file  Tobacco Use  . Smoking status: Current Every Day Smoker    Packs/day: 0.50    Types: Cigarettes  . Smokeless tobacco: Never Used  Substance and Sexual Activity  . Alcohol use: No  . Drug use: No  . Sexual activity: Not on file  Lifestyle  .  Physical activity    Days per week: Not on file    Minutes per session: Not on file  . Stress: Not on file  Relationships  . Social Herbalist on phone: Not on file    Gets together: Not on file    Attends religious service: Not on file    Active member of club or organization: Not on file    Attends meetings of clubs or organizations: Not on file    Relationship status: Not on file  . Intimate partner violence    Fear of current or ex partner: Not on file    Emotionally abused: Not on file    Physically abused: Not on file    Forced sexual activity: Not on file  Other Topics Concern  . Not on file  Social History Narrative  . Not on file    Past Medical History, Surgical history, Social history,  and Family history were reviewed and updated as appropriate.   Please see review of systems for further details on the patient's review from today.   Review of Systems:  Review of Systems  Respiratory: Negative for chest tightness and shortness of breath.   Cardiovascular: Negative for chest pain.  Musculoskeletal: Positive for myalgias. Negative for gait problem.  Skin: Positive for color change. Negative for wound.       Bruising and tenderness of the left proximal medial thigh.    Objective:   Physical Exam:  BP 128/62 (BP Location: Left Arm, Patient Position: Sitting)   Pulse 68   Temp 98.3 F (36.8 C)   Resp 18   Ht 5\' 8"  (1.727 m)   Wt 196 lb 4.8 oz (89 kg)   SpO2 100%   BMI 29.85 kg/m  ECOG: 0  Physical Exam Constitutional:      General: He is not in acute distress.    Appearance: Normal appearance. He is not ill-appearing, toxic-appearing or diaphoretic.  HENT:     Head: Normocephalic and atraumatic.  Skin:    General: Skin is warm and dry.     Findings: Bruising present.     Comments: Bruising of the left proximal medial thigh.  Neurological:     Mental Status: He is alert.     Gait: Gait normal.     Lab Review:     Component Value Date/Time   NA 140 07/19/2019 0913   K 4.9 07/19/2019 0913   CL 104 07/19/2019 0913   CO2 27 07/19/2019 0913   GLUCOSE 92 07/19/2019 0913   BUN 19 07/19/2019 0913   CREATININE 1.44 (H) 07/19/2019 0913   CALCIUM 9.4 07/19/2019 0913   PROT 7.1 07/19/2019 0913   PROT 6.6 03/19/2018 0817   ALBUMIN 3.9 07/19/2019 0913   ALBUMIN 4.2 03/19/2018 0817   AST 30 07/19/2019 0913   ALT 42 07/19/2019 0913   ALKPHOS 59 07/19/2019 0913   BILITOT 0.3 07/19/2019 0913   GFRNONAA 56 (L) 07/19/2019 0913   GFRAA >60 07/19/2019 0913       Component Value Date/Time   WBC 6.1 07/19/2019 0913   WBC 6.2 06/24/2019 0818   RBC 4.70 07/19/2019 0913   HGB 13.8 07/19/2019 0913   HCT 41.7 07/19/2019 0913   PLT 202 07/19/2019 0913   MCV  88.7 07/19/2019 0913   MCH 29.4 07/19/2019 0913   MCHC 33.1 07/19/2019 0913   RDW 13.2 07/19/2019 0913   LYMPHSABS 2.0 07/19/2019 0913   MONOABS 0.6 07/19/2019 0913  EOSABS 0.2 07/19/2019 0913   BASOSABS 0.0 07/19/2019 0913   -------------------------------  Imaging from last 24 hours (if applicable):  Radiology interpretation: No results found.

## 2019-07-19 NOTE — Telephone Encounter (Signed)
On 07/04/2019, received form from [employer] PA department to be completed.   Form filled out, signed by MD. Virgel Gess in to [phone] with MD note and MD appeals letter (all previously sent)

## 2019-07-20 ENCOUNTER — Other Ambulatory Visit: Payer: Self-pay

## 2019-07-25 NOTE — Telephone Encounter (Addendum)
Spoke with [employer] PA dept and was notified that patients insurance company handles all appeals. Called insurance company @ [phone] to inquire about this and was notified that appeals are in fact handled thru them.   Contact info received from insurance company: Fax: [phone] Attn: [place] Department  All notifications concerning appeals up to date have been received from [employer] thus faxed correspondence has been sent there  Appeal info faxed today 07/25/2019

## 2019-07-27 ENCOUNTER — Encounter: Payer: Self-pay | Admitting: Family Medicine

## 2019-07-27 ENCOUNTER — Ambulatory Visit: Payer: 59 | Admitting: Family Medicine

## 2019-07-27 ENCOUNTER — Other Ambulatory Visit: Payer: Self-pay

## 2019-07-27 VITALS — BP 130/78 | HR 73 | Temp 98.0°F | Ht 68.0 in | Wt 194.2 lb

## 2019-07-27 DIAGNOSIS — E010 Iodine-deficiency related diffuse (endemic) goiter: Secondary | ICD-10-CM | POA: Diagnosis not present

## 2019-07-27 DIAGNOSIS — S76212A Strain of adductor muscle, fascia and tendon of left thigh, initial encounter: Secondary | ICD-10-CM

## 2019-07-27 MED ORDER — PREDNISONE 20 MG PO TABS: 40.0000 mg | ORAL_TABLET | Freq: Every day | ORAL | 0 refills | Status: AC

## 2019-07-27 NOTE — Progress Notes (Signed)
Musculoskeletal Exam  Patient: Jose [name] DOB: [birth year]  DOS: 07/27/2019  SUBJECTIVE:  Chief Complaint:   Chief Complaint  Patient presents with  . muscle pull    for one week    Jose [name] is a 48 y.o.  male for evaluation and treatment of L groin pain.   Onset:  1 week ago. Was playing soccer and flipped and felt some pain in the inner L thigh Location:  Inner thigh Character:  dull and sharp  Progression of issue:  is unchanged Associated symptoms: Bruising, some swelling Treatment: to date has been ice, heat Neurovascular symptoms: no  Patient has a history of a large thyroid.  He had an ultrasound showing this back in 2017.  Associated lab work was unremarkable.  With the past 3 months, he feels that it is getting larger.  He is having trouble swallowing at times.  This has been pretty consistent.  No pain, voice changes, or injury.  He is not having any thyroid symptoms such as palpitations, weight changes, or temperature intolerance.  ROS: Musculoskeletal/Extremities: +groin pain Endo: No wt changes  Past Medical History:  Diagnosis Date  . Crohn's disease ([place])   . Hyperlipidemia   . Thrombophlebitis     Objective: VITAL SIGNS: BP 130/78 (BP Location: Left Arm, Patient Position: Sitting, Cuff Size: Normal)   Pulse 73   Temp 98 F (36.7 C) (Temporal)   Ht 5' 8"  (1.727 m)   Wt 194 lb 4 oz (88.1 kg)   SpO2 97%   BMI 29.54 kg/m  Constitutional: Well formed, well developed. No acute distress. Cardiovascular: Brisk cap refill Thorax & Lungs: No accessory muscle use Neck: Supple, grossly enlarged thyroid, no nodules noted Musculoskeletal: L thigh/hip.   Normal active range of motion: yes.   Normal passive range of motion: yes Tenderness to palpation: Yes, over hip adductors Deformity: no Ecchymosis: yes Tests positive: none Tests negative: Ober's, Stinchfield, log roll, FABER, FADDIR Neurologic: Normal sensory function. No focal deficits  noted. DTR's equal and symmetric in LE's. No clonus. Psychiatric: Normal mood. Age appropriate judgment and insight. Alert & oriented x 3.    Assessment:  Inguinal strain, left, initial encounter - Plan: Ambulatory referral to Physical Therapy, predniSONE (DELTASONE) 20 MG tablet  Thyromegaly - Plan: Ambulatory referral to ENT  Plan: Stretches/exercises, heat, ice, pred burst, Tylenol. Refer to ENT w swallowing issues. F/u prn. The patient voiced understanding and agreement to the plan.   [place], DO 07/27/19  2:58 PM

## 2019-07-27 NOTE — Patient Instructions (Addendum)
Heat (pad or rice pillow in microwave) over affected area, 10-15 minutes twice daily.   OK to take Tylenol 1000 mg (2 extra strength tabs) or 975 mg (3 regular strength tabs) every 6 hours as needed.  Continue with the bracing.   If you do not hear anything about your referral in the next 1-2 weeks, call our office and ask for an update.  Do the butterfly stretch as well.   Quadriceps Strain Rehab It is normal to feel mild stretching, pulling, tightness, or discomfort as you do these exercises, but you should stop right away if you feel sudden pain or your pain gets worse. Stretching and range of motion exercises These exercises warm up your muscles and joints and improve the movement and flexibility of your thigh. These exercises can also help to relieve stiffness or swelling. Exercise A: Heel slides   1. Lie on your back with both knees straight. If this causes back discomfort, bend the knee of your healthy leg, placing your foot flat on the floor. 2. Slowly slide your left / right heel back toward your buttocks until you feel a gentle stretch in the front of your knee or thigh. 3. Hold for 30 seconds. Then slowly slide your heel back to the starting position. Repeat 2 times. Complete this exercise 3 times a week. Exercise B: Quadriceps stretch, prone   1. Lie on your abdomen on a firm surface, such as a bed or padded floor. 2. Bend your left / right knee and hold your ankle. If you cannot reach your ankle or pant leg, loop a belt around your foot and grab the belt instead. 3. Gently pull your heel toward your buttocks. Your knee should not slide out to the side. You should feel a stretch in the front of your thigh and knee. 4. Hold this position for 30 seconds. Repeat 2 times. Complete this exercise 3 times a week. Strengthening exercises These exercises build strength and endurance in your thigh. Endurance is the ability to use your muscles for a long time, even after your muscles  get tired. Exercise C: Straight leg raises (quadriceps and hip flexors) Quality counts! Watch for signs that the quadriceps muscle is working to ensure that you are strengthening the correct muscles and not cheating by using healthier muscles. 1. Lie on your back with your left / right leg extended and your other knee bent. 2. Tense the muscles in the front of your left / right thigh. You should see your kneecap slide up or see increased dimpling just above the knee. 3. Tighten these muscles even more and raise your leg 4-6 inches (10-15 cm) off the floor. 4. Hold for 3 seconds. 5. Keep the thigh muscles tense as you lower your leg. 6. Relax the muscles slowly and completely after each repetition. Repeat 2 times. Complete this exercise 3 times a week. Exercise D: Straight leg raises (hip extensors) 1. Lie on your belly on a bed or a firm surface with a pillow under your hips. 2. Bend your left / right knee so your foot is straight up in the air. 3. Tense your buttock muscles and lift your left / right thigh off the bed. Do not let your back arch. 4. Hold this position for 3 seconds. 5. Slowly return to the starting position. Let your muscles relax completely before doing another repetition. Repeat 2 times. Complete this exercise 3 times a week. Exercise E: Wall sits   Follow the directions for form  closely. If you do not place your feet and knees properly, this can lead to knee pain. 1. Lean back against a smooth wall or door and walk your feet out 18-24 inches (46-61 cm) from it. Place your feet hip-width apart. 2. Slowly slide down the wall or door until your knees bend  60-90 degrees. Keep your weight back and over your heels, not over your toes. Keep your thighs straight or pointing slightly outward. 3. Hold for 1 second. 4. Use your thigh and buttock muscles to push you back up to a standing position. Keep your weight through your heels while you do this. 5. Rest for 5 seconds in  between repetitions. Repeat 2 times. Complete this exercise 3 times a week. Make sure you discuss any questions you have with your health care provider. Document Released: 09/01/2005 Document Revised: 05/08/2016 Document Reviewed: 06/05/2015 Elsevier Interactive Patient Education  2018 [place] Tendinitis [facility]hab  It is normal to feel mild stretching, pulling, tightness, or discomfort as you do these exercises, but you should stop right away if you feel sudden pain or your pain gets worse.  Stretching and range of motion exercises These exercises warm up your muscles and joints and improve the movement and flexibility of your thigh. These exercises also help to relieve pain, numbness, and tingling. Exercise A: Hamstring stretch, supine    1. Lie on your back. Loop a belt or towel across the ball of your left / right foot The ball of your foot is on the walking surface, right under your toes. 2. Straighten your left / right knee and slowly pull on the belt to raise your leg. Stop when you feel a gentle stretch behind your left / right knee or thigh. ? Do not allow the knee to bend. ? Keep your other leg flat on the floor. 3. Hold this position for 30 seconds. Repeat 2 times. Complete this exercise 3 times a week. Strengthening exercises These exercises build strength and endurance in your thigh. Endurance is the ability to use your muscles for a long time, even after they get tired. Exercise B: Straight leg raises (hip extensors) 1. Lie on your belly on a bed or a firm surface with a pillow under your hips. 2. Bend your left / right knee so your foot is straight up in the air. 3. Squeeze your buttock muscles and lift your left / right thigh off the bed. Do not let your back arch. 4. Hold this position for 3 seconds. 5. Slowly return to the starting position. Let your muscles relax completely before you do another repetition. Repeat 2 times. Complete this exercise 3  times a week. Exercise C: Bridge (hip extensors)     1. Lie on your back on a firm surface with your knees bent and your feet flat on the floor. 2. Tighten your buttocks muscles and lift your bottom off the floor until your trunk is level with your thighs. ? You should feel the muscles working in your buttocks and the back of your thighs. If you do not feel these muscles, slide your feet 1-2 inches (2.5-5 cm) farther away from your buttocks. ? Do not arch your back. 3. Hold this position for 3 seconds. 4. Slowly lower your hips to the starting position. 5. Let your buttocks muscles relax completely between repetitions. If this exercise is too easy, try doing it with your arms crossed over your chest. Repeat 2 times. Complete this exercise 3 times  a week. Exercise D: Hamstring eccentric, prone 1. Lie on your belly on a bed or on the floor. 2. Start with your legs straight. Cross your legs at the ankles with your left / right leg on top. 3. Using your bottom leg to do the work, bend both knees. 4. Using just your left / right leg alone, slowly lower your leg back down toward the bed. Add a 5 lb weight as told by your health care provider. 5. Let your muscles relax completely between repetitions. Repeat 2 times. Complete this exercise 3 times a week. Exercise E: Squats 1. Stand in front of a table, with your feet and knees pointing straight ahead. You may rest your hands on the table for balance but not for support. 2. Slowly bend your knees and lower your hips like you are going to sit in a chair. Keep your thighs straight or pointed slightly outward. ? Keep your weight over your heels, not over your toes. ? Keep your lower legs upright so they are parallel with the table legs. ? Do not let your hips go lower than your knees. Stop when your knees are bent to the shape of an upside-down letter "L" (90 degree angle). ? Do not bend lower than told by your health care provider. ? If your knee  pain increases, do not bend as low. 3. Hold the squat position 1-2 seconds. 4. Slowly push with your legs to return to standing. Do not use your hands to pull yourself to standing. Repeat 2 times. Complete this exercise 3 times a week. Make sure you discuss any questions you have with your health care provider. Document Released: 09/01/2005 Document Revised: 05/08/2016 Document Reviewed: 06/05/2015 Elsevier Interactive Patient Education  [employer].

## 2019-08-19 ENCOUNTER — Ambulatory Visit (HOSPITAL_COMMUNITY)
Admission: RE | Admit: 2019-08-19 | Discharge: 2019-08-19 | Disposition: A | Discharge status: Home / Self Care | Payer: 59 | Source: Ambulatory Visit | Attending: Gastroenterology | Admitting: Gastroenterology

## 2019-08-19 ENCOUNTER — Other Ambulatory Visit: Payer: Self-pay

## 2019-08-19 DIAGNOSIS — K5090 Crohn's disease, unspecified, without complications: Secondary | ICD-10-CM | POA: Insufficient documentation

## 2019-08-19 LAB — COMPREHENSIVE METABOLIC PANEL
ALT: 51 U/L — ABNORMAL HIGH (ref 0–44)
AST: 33 U/L (ref 15–41)
Albumin: 3.9 g/dL (ref 3.5–5.0)
Alkaline Phosphatase: 50 U/L (ref 38–126)
Anion gap: 7 (ref 5–15)
BUN: 23 mg/dL — ABNORMAL HIGH (ref 6–20)
CO2: 24 mmol/L (ref 22–32)
Calcium: 9.3 mg/dL (ref 8.9–10.3)
Chloride: 108 mmol/L (ref 98–111)
Creatinine, Ser: 1.21 mg/dL (ref 0.61–1.24)
GFR calc Af Amer: >60 mL/min (ref >60)
GFR calc non Af Amer: >60 mL/min (ref >60)
Glucose, Bld: 88 mg/dL (ref 70–99)
Potassium: 4.4 mmol/L (ref 3.5–5.1)
Sodium: 139 mmol/L (ref 135–145)
Total Bilirubin: 0.7 mg/dL (ref 0.3–1.2)
Total Protein: 6.9 g/dL (ref 6.5–8.1)

## 2019-08-19 LAB — CBC WITH DIFFERENTIAL/PLATELET
Abs Immature Granulocytes: 0.03 10*3/uL (ref 0.00–0.07)
Basophils Absolute: 0 10*3/uL (ref 0.0–0.1)
Basophils Relative: 1 %
Eosinophils Absolute: 0.3 10*3/uL (ref 0.0–0.5)
Eosinophils Relative: 4 %
HCT: 41.9 % (ref 39.0–52.0)
Hemoglobin: 13.9 g/dL (ref 13.0–17.0)
Immature Granulocytes: 1 %
Lymphocytes Relative: 29 %
Lymphs Abs: 1.7 10*3/uL (ref 0.7–4.0)
MCH: 29.4 pg (ref 26.0–34.0)
MCHC: 33.2 g/dL (ref 30.0–36.0)
MCV: 88.8 fL (ref 80.0–100.0)
Monocytes Absolute: 0.6 10*3/uL (ref 0.1–1.0)
Monocytes Relative: 10 %
Neutro Abs: 3.3 10*3/uL (ref 1.7–7.7)
Neutrophils Relative %: 55 %
Platelets: 189 10*3/uL (ref 150–400)
RBC: 4.72 MIL/uL (ref 4.22–5.81)
RDW: 12.9 % (ref 11.5–15.5)
WBC: 5.9 10*3/uL (ref 4.0–10.5)
nRBC: 0 % (ref 0.0–0.2)

## 2019-08-19 MED ORDER — DIPHENHYDRAMINE HCL 50 MG/ML IJ SOLN: 50.0000 mg | Freq: Once | INTRAMUSCULAR | Status: DC

## 2019-08-19 MED ORDER — SODIUM CHLORIDE 0.9 % IV SOLN: INTRAVENOUS | Status: DC

## 2019-08-19 MED ORDER — ACETAMINOPHEN 325 MG PO TABS: 650.0000 mg | ORAL_TABLET | Freq: Once | ORAL | Status: DC

## 2019-08-19 MED ORDER — ACETAMINOPHEN 325 MG PO TABS
ORAL_TABLET | ORAL | Status: AC
  Administered 2019-08-19: 650 mg
  Filled 2019-08-19: qty 2

## 2019-08-19 MED ORDER — METHYLPREDNISOLONE SODIUM SUCC 40 MG IJ SOLR: 40.0000 mg | Freq: Once | INTRAMUSCULAR | Status: DC

## 2019-08-19 MED ORDER — METHYLPREDNISOLONE SODIUM SUCC 40 MG IJ SOLR
INTRAMUSCULAR | Status: AC
  Administered 2019-08-19: 40 mg
  Filled 2019-08-19: qty 1

## 2019-08-19 MED ORDER — SODIUM CHLORIDE 0.9 % IV SOLN
7.5000 mg/kg | INTRAVENOUS | Status: DC
  Administered 2019-08-19: 700 mg via INTRAVENOUS
  Filled 2019-08-19: qty 70

## 2019-08-19 MED ORDER — DIPHENHYDRAMINE HCL 50 MG/ML IJ SOLN
INTRAMUSCULAR | Status: AC
  Administered 2019-08-19: 50 mg
  Filled 2019-08-19: qty 1

## 2019-08-19 NOTE — Telephone Encounter (Signed)
[employer] has denied coverage of Nexletol for patient.   He needs to have a diagnosis of ASCVD or HeFH for medication to be approved and LDL >172m/dL with heart disease or 1358mdL without heart disease (within the last 120 days while on the strongest lipid lowering drug he can tolerate for at least 12 weeks).   If his lipid lowering therapy includes zetia, his LDL would only have to be over 7077mL with heart disease or over 100m22m without heart disease. He would need to have been taking zetia for 12 weeks consecutively.

## 2019-08-27 LAB — LIPID PANEL
Chol/HDL Ratio: 5.5 ratio — ABNORMAL HIGH (ref 0.0–5.0)
Cholesterol, Total: 170 mg/dL (ref 100–199)
HDL: 31 mg/dL — ABNORMAL LOW (ref >39)
LDL Chol Calc (NIH): 84 mg/dL (ref 0–99)
Triglycerides: 334 mg/dL — ABNORMAL HIGH (ref 0–149)
VLDL Cholesterol Cal: 55 mg/dL — ABNORMAL HIGH (ref 5–40)

## 2019-08-29 ENCOUNTER — Telehealth (INDEPENDENT_AMBULATORY_CARE_PROVIDER_SITE_OTHER): Payer: 59 | Admitting: Internal Medicine

## 2019-08-29 ENCOUNTER — Encounter: Payer: Self-pay | Admitting: Internal Medicine

## 2019-08-29 VITALS — BP 129/75 | Wt 195.0 lb

## 2019-08-29 DIAGNOSIS — Z8249 Family history of ischemic heart disease and other diseases of the circulatory system: Secondary | ICD-10-CM

## 2019-08-29 DIAGNOSIS — E7841 Elevated Lipoprotein(a): Secondary | ICD-10-CM | POA: Diagnosis not present

## 2019-08-29 DIAGNOSIS — Z789 Other specified health status: Secondary | ICD-10-CM

## 2019-08-29 DIAGNOSIS — E782 Mixed hyperlipidemia: Secondary | ICD-10-CM

## 2019-08-29 NOTE — Progress Notes (Signed)
Virtual Visit via Video Note   This visit type was conducted due to national recommendations for restrictions regarding the COVID-19 Pandemic (e.g. social distancing) in an effort to limit this patient's exposure and mitigate transmission in our community.  Due to his co-morbid illnesses, this patient is at least at moderate risk for complications without adequate follow up.  This format is felt to be most appropriate for this patient at this time.  All issues noted in this document were discussed and addressed.  A limited physical exam was performed with this format.  Please refer to the patient's chart for his consent to telehealth for [facility].   Evaluation Performed: Telephone follow-up  Date:  08/29/2019   ID:  [name], DOB [birth year], MRN [id number]  Patient Location:  [address] [place] [address]  Provider location:   [address], [address] [place], [place] [address]  PCP:  [name], DO  Cardiologist:  [name], MD Electrophysiologist:  None   Chief Complaint:  No complaints  History of Present Illness:    Jose [name] is a 48 y.o. male who presents via audio/video conferencing for a telehealth visit today.  [name] was seen today for video follow-up.  He is a patient of 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  There is a significant family history of heart disease in his parents and a brother.  Recently a lipid NMR showed elevated particle numbers and LDL C of a 122.  APO B levels were also elevated.  He had an LPA which was elevated at 112.  He did not tolerate statins due to significant myalgias.  I therefore recommended PCSK9 inhibitor therapy.  He was very reluctant to start that.  Did obtain a calcium score which was the abnormal with a CAC of 22.  This was 77th percentile for age and sex matched control.  Based on these findings there is more evidence to  consider aggressive therapy since he has early onset heart disease.  08/29/2019  [name] returns today for follow-up.  Overall he reports doing well on Nexletol.  Unfortunately his insurance company did not cover the medication.  I believe they argue that he did not have cardiovascular disease however he does have an abnormal coronary calcium score of 22 indicating he does have ASCVD.  In addition he was intolerant to PCSK9 inhibitors and has been intolerant to statin therapy.  It was advised that he take ezetimibe however he has done very well with the Nexletol.  In fact he has almost no side effects.  His total cholesterol is now down to 170 and the LDL has come down to 84.  His triglycerides however did go up, which may be related to some dietary changes he made recently as well as decrease in activity.  Will continue to work on that.  The patient does not have symptoms concerning for COVID-19 infection (fever, chills, cough, or new SHORTNESS OF BREATH).    Prior CV studies:   The following studies were reviewed today:  Coronary calcium score  PMHx:  Past Medical History:  Diagnosis Date  . Crohn's disease ([place])   . Hyperlipidemia   . Thrombophlebitis     No past surgical history on file.  FAMHx:  Family History  Problem Relation Age of Onset  . Heart disease Mother     SOCHx:   reports that he has been  smoking cigarettes. He has been smoking about 0.50 packs per day. He has never used smokeless tobacco. He reports that he does not drink alcohol or use drugs.  ALLERGIES:  Allergies  Allergen Reactions  . Statins Other (See Comments)    Chest pain  . Repatha [Evolocumab] Other (See Comments)    Muscle/Back Pain    MEDS:  Current Meds  Medication Sig  . Bempedoic Acid (NEXLETOL) 180 MG TABS Take 1 tablet by mouth daily. (Patient taking differently: Take 1 tablet by mouth daily. )  . InFLIXimab (REMICADE IV) Inject into the vein every 8 (eight) weeks.    Alveda Reasons 20 MG TABS tablet TAKE 1 TABLET (20 MG TOTAL) DAILY WITH SUPPER BY MOUTH     ROS: Pertinent items noted in HPI and remainder of comprehensive ROS otherwise negative.  Labs/Other Tests and Data Reviewed:    Recent Labs: 08/19/2019: ALT 51; BUN 23; Creatinine, Ser 1.21; Hemoglobin 13.9; Platelets 189; Potassium 4.4; Sodium 139   Recent Lipid Panel Lab Results  Component Value Date/Time   CHOL 170 08/26/2019 08:18 AM   TRIG 334 (H) 08/26/2019 08:18 AM   HDL 31 (L) 08/26/2019 08:18 AM   CHOLHDL 5.5 (H) 08/26/2019 08:18 AM   CHOLHDL 2.6 03/14/2016 09:01 AM   LDLCALC 84 08/26/2019 08:18 AM    Wt Readings from Last 3 Encounters:  08/29/19 195 lb (88.5 kg)  08/19/19 192 lb (87.1 kg)  07/27/19 194 lb 4 oz (88.1 kg)     Exam:    Vital Signs:  BP 129/75   Wt 195 lb (88.5 kg)   BMI 29.65 kg/m    General appearance: alert and no distress Lungs: No visual respiratory difficulty Abdomen: Normal weight Extremities: extremities normal, atraumatic, no cyanosis or edema Skin: Skin color, texture, turgor normal. No rashes or lesions Neurologic: Mental status: Alert, oriented, thought content appropriate Psych: Pleasant  ASSESSMENT & PLAN:    1. Dyslipidemia with elevated LP(a) 2. Strong family history of premature coronary disease 3. Statin intolerance 4. Elevated CAC score 22 (10/2018) -sign of ASCVD 5. Intolerance to Repatha  [name] has dyslipidemia with elevated LP(a), a strong family history of premature coronary disease and early onset coronary artery disease with mildly elevated calcium score.  Based on these findings I recommended PCSK9 inhibitor therapy which she was approved for however he did not tolerate due to side effects.  Ultimately we offered Nexletol as an alternative.  He has been taking that without any significant side effects and has had reduction in LDL cholesterol.  Is not clear however if he will have reduction in LP(a) with this and the evidence  is not clear yet of cardiovascular benefit however it does appear that a lower LDL is associated with lower cardiovascular risk.  Because he has ASCVD and has been intolerant to multiple medications, we are asking for prior authorization for this.  While can reach out to [employer] for approval.  There was suggestion that he may need to be on ezetimibe prior to this.  Options might then include stopping the medication, a trial of ezetimibe however he was still not reach a target LDL less than 70 as he does not reach that target with the Nexletol alone.  Alternatively, he could be on Nexlizet (nexletol/ezetimibe combination), however, that may also meet skepticism from his insurance company. Will request repeat PA.  COVID-19 Education: The signs and symptoms of COVID-19 were discussed with the patient and how to seek care  for testing (follow up with PCP or arrange E-visit).  The importance of social distancing was discussed today.  Patient Risk:   After full review of this patients clinical status, I feel that they are at least moderate risk at this time.  Time:   Today, I have spent 30 minutes with the patient with telehealth technology discussing coronary artery calcium score, elevated LPA, PCSK9 inhibitor mechanisms of action, clinical trial data, research alternatives.     Medication Adjustments/Labs and Tests Ordered: Current medicines are reviewed at length with the patient today.  Concerns regarding medicines are outlined above.   Tests Ordered: Orders Placed This Encounter  Procedures  . Lipid panel    Medication Changes: No orders of the defined types were placed in this encounter.   Disposition:  in 6 month(s)  Pixie Casino, MD, [facility], [place] Director of the Advanced Lipid Disorders &  Cardiovascular Risk Reduction Clinic Diplomate of the American Board of Clinical Lipidology Attending Cardiologist  Direct Dial: [phone]   Fax: [phone]  Website:  [email or web address]  Pixie Casino, MD  08/29/2019 10:14 AM

## 2019-08-29 NOTE — Patient Instructions (Signed)
Medication Instructions:  Your physician recommends that you continue on your current medications as directed. Please refer to the Current Medication list given to you today.  *If you need a refill on your cardiac medications before your next appointment, please call your pharmacy*  Lab Work: FASTING lab work in 6 months - prior to next lipid clinic appointment If you have labs (blood work) drawn today and your tests are completely normal, you will receive your results only by: Marland Kitchen MyChart Message (if you have MyChart) OR . A paper copy in the mail If you have any lab test that is abnormal or we need to change your treatment, we will call you to review the results.  Testing/Procedures: NONE  Follow-Up: At [facility], you and your health needs are our priority.  As part of our continuing mission to provide you with exceptional heart care, we have created designated Provider Care Teams.  These Care Teams include your primary Cardiologist (physician) and Advanced Practice Providers (APPs -  Physician Assistants and Nurse Practitioners) who all work together to provide you with the care you need, when you need it.  Your next appointment:   6 month(s)  The format for your next appointment:   Either In Person or Virtual  Provider:   Raliegh Ip Mali Hilty, MD  Other Instructions

## 2019-09-13 ENCOUNTER — Encounter (HOSPITAL_COMMUNITY): Payer: 59

## 2019-09-19 ENCOUNTER — Telehealth: Payer: Self-pay | Admitting: Internal Medicine

## 2019-09-19 NOTE — Telephone Encounter (Signed)
Prior authorization for Nexletol re-submitted via CMM with last MD note from 08/29/2019 attached  (Key: [id number])

## 2019-09-19 NOTE — Telephone Encounter (Signed)
Request Reference Number: [id number]. NEXLETOL TAB 180MG is approved through 09/18/2020

## 2019-09-20 MED ORDER — NEXLETOL 180 MG PO TABS: 1.0000 | ORAL_TABLET | Freq: Every day | ORAL | 3 refills | Status: DC

## 2019-09-20 NOTE — Addendum Note (Signed)
Addended by: Fidel Levy on: 09/20/2019 02:49 PM   Modules accepted: Orders

## 2019-09-21 NOTE — Telephone Encounter (Signed)
A new PA has been submitted and approved as of 09/19/2019

## 2019-10-01 NOTE — Telephone Encounter (Signed)
Excellent - thanks!

## 2019-10-14 ENCOUNTER — Encounter (HOSPITAL_COMMUNITY): Payer: 59

## 2019-10-18 ENCOUNTER — Telehealth: Payer: Self-pay | Admitting: Oncology

## 2019-10-18 NOTE — Telephone Encounter (Signed)
Returned patient's phone call regarding rescheduling 02/05 appointment, per patient's request appointment has moved to 03/04.

## 2019-10-21 ENCOUNTER — Inpatient Hospital Stay: Payer: 59

## 2019-10-21 ENCOUNTER — Inpatient Hospital Stay: Payer: 59 | Admitting: Oncology

## 2019-11-17 ENCOUNTER — Other Ambulatory Visit: Payer: 59

## 2019-11-17 ENCOUNTER — Inpatient Hospital Stay: Payer: 59 | Attending: Oncology | Admitting: Oncology

## 2019-11-17 ENCOUNTER — Other Ambulatory Visit: Payer: Self-pay

## 2019-11-17 VITALS — BP 137/74 | HR 69 | Temp 97.8°F | Resp 20 | Ht 68.0 in | Wt 197.9 lb

## 2019-11-17 DIAGNOSIS — Z86711 Personal history of pulmonary embolism: Secondary | ICD-10-CM | POA: Diagnosis not present

## 2019-11-17 DIAGNOSIS — I2699 Other pulmonary embolism without acute cor pulmonale: Secondary | ICD-10-CM | POA: Diagnosis not present

## 2019-11-17 DIAGNOSIS — Z79899 Other long term (current) drug therapy: Secondary | ICD-10-CM | POA: Diagnosis not present

## 2019-11-17 DIAGNOSIS — I809 Phlebitis and thrombophlebitis of unspecified site: Secondary | ICD-10-CM | POA: Insufficient documentation

## 2019-11-17 DIAGNOSIS — Z7901 Long term (current) use of anticoagulants: Secondary | ICD-10-CM | POA: Diagnosis not present

## 2019-11-17 DIAGNOSIS — I749 Embolism and thrombosis of unspecified artery: Secondary | ICD-10-CM | POA: Diagnosis present

## 2019-11-17 NOTE — Progress Notes (Signed)
Hematology and Oncology Follow Up Visit  Jose [name] [id number] May 18, 1971 49 y.o. 11/17/2019 8:23 AM Jose [name], Jose [name], DOWendling, Jose [name]*   Principle Diagnosis: 49 year old man with recurrent venous thromboembolism diagnosed in 2017.  He presented with initially superficial phlebitis and subsequently pulmonary embolism in 2018.  Hypercoagulable work-up was unremarkable.   Current therapy: Xarelto started in November 2018.  Interim History: Mr. Jose [name] presents today for a follow-up visit.  Since the last visit, he reports no major changes in his health.  He continues to tolerate Xarelto without any recent issues.  He denies any bleeding or thrombosis episodes.  He denies any skin rashes or lesions.  He denies any phlebitis.  Medications: Without any changes on review. Current Outpatient Medications  Medication Sig Dispense Refill  . Bempedoic Acid (NEXLETOL) 180 MG TABS Take 1 tablet by mouth daily. 90 tablet 3  . InFLIXimab (REMICADE IV) Inject into the vein every 8 (eight) weeks.      Alveda Reasons 20 MG TABS tablet TAKE 1 TABLET (20 MG TOTAL) DAILY WITH SUPPER BY MOUTH 90 tablet 1   No current facility-administered medications for this visit.     Allergies:  Allergies  Allergen Reactions  . Statins Other (See Comments)    Chest pain  . Repatha [Evolocumab] Other (See Comments)    Muscle/Back Pain       Physical Exam:  Blood pressure 137/74, pulse 69, temperature 97.8 F (36.6 C), temperature source Temporal, resp. rate 20, height 5' 8"  (1.727 m), weight 197 lb 14.4 oz (89.8 kg), SpO2 100 %.    ECOG: 0    General appearance: Alert, awake without any distress. Head: Atraumatic without abnormalities Oropharynx: Without any thrush or ulcers. Eyes: No scleral icterus. Lymph nodes: No lymphadenopathy noted in the cervical, supraclavicular, or axillary nodes Heart:regular rate and rhythm, without any murmurs or gallops.   Lung: Clear to  auscultation without any rhonchi, wheezes or dullness to percussion. Abdomin: Soft, nontender without any shifting dullness or ascites. Musculoskeletal: No clubbing or cyanosis. Neurological: No motor or sensory deficits. Skin: No rashes or lesions.   Lab Results: Lab Results  Component Value Date   WBC 5.9 08/19/2019   HGB 13.9 08/19/2019   HCT 41.9 08/19/2019   MCV 88.8 08/19/2019   PLT 189 08/19/2019     Chemistry      Component Value Date/Time   NA 139 08/19/2019 0833   K 4.4 08/19/2019 0833   CL 108 08/19/2019 0833   CO2 24 08/19/2019 0833   BUN 23 (H) 08/19/2019 0833   CREATININE 1.21 08/19/2019 0833   CREATININE 1.44 (H) 07/19/2019 0913      Component Value Date/Time   CALCIUM 9.3 08/19/2019 0833   ALKPHOS 50 08/19/2019 0833   AST 33 08/19/2019 0833   AST 30 07/19/2019 0913   ALT 51 (H) 08/19/2019 0833   ALT 42 07/19/2019 0913   BILITOT 0.7 08/19/2019 0833   BILITOT 0.3 07/19/2019 0913     Impression and Plan:  49 year old man with:  1.  Recurrent to have venous thromboembolism diagnosed in 2017.  He presented with superficial phlebitis and subsequently pulmonary embolism diagnosed in November 2018.    He continues to be on full dose anticoagulation with Xarelto without any major complications.  The etiology of his recurrent thrombosis could be related to laboratory bowel disease treatment for it versus undiagnosed hypercoagulable state.  Risks and benefits of continuing long-term anticoagulation were reviewed.  Discontinuation of Xarelto versus continuing long-term  therapy with.  Vascular ultrasound in November 2018 did not show any evidence of any residual thrombosis.  Laboratory data also obtained in December 2020 were reviewed which showed number of hematological parameters at this time.  After discussion today, we have opted to continue Xarelto and continue to follow him periodically.  2.  Kidney mass: Evaluation in 2018 showed consistent with cyst..  3.   Follow-up: In 7 months for a repeat evaluation.  30  minutes were dedicated to the encounter.  The time was spent on reviewing his disease status, laboratory data and imaging studies and options of therapy long-term.  Zola Button, MD 3/4/20218:23 AM

## 2019-11-18 ENCOUNTER — Telehealth: Payer: Self-pay | Admitting: Oncology

## 2019-11-18 NOTE — Telephone Encounter (Signed)
Scheduled appt per 3/4 los.  Left a voice message of the appt date and time.

## 2019-11-25 ENCOUNTER — Other Ambulatory Visit: Payer: Self-pay | Admitting: Oncology

## 2020-03-17 LAB — LIPID PANEL
Chol/HDL Ratio: 5.2 ratio — ABNORMAL HIGH (ref 0.0–5.0)
Cholesterol, Total: 173 mg/dL (ref 100–199)
HDL: 33 mg/dL — ABNORMAL LOW (ref >39)
LDL Chol Calc (NIH): 111 mg/dL — ABNORMAL HIGH (ref 0–99)
Triglycerides: 163 mg/dL — ABNORMAL HIGH (ref 0–149)
VLDL Cholesterol Cal: 29 mg/dL (ref 5–40)

## 2020-03-23 ENCOUNTER — Encounter: Payer: Self-pay | Admitting: Internal Medicine

## 2020-03-23 ENCOUNTER — Telehealth (INDEPENDENT_AMBULATORY_CARE_PROVIDER_SITE_OTHER): Payer: 59 | Admitting: Internal Medicine

## 2020-03-23 VITALS — BP 129/70 | Wt 191.0 lb

## 2020-03-23 DIAGNOSIS — Z789 Other specified health status: Secondary | ICD-10-CM

## 2020-03-23 DIAGNOSIS — Z8249 Family history of ischemic heart disease and other diseases of the circulatory system: Secondary | ICD-10-CM

## 2020-03-23 DIAGNOSIS — E782 Mixed hyperlipidemia: Secondary | ICD-10-CM | POA: Diagnosis not present

## 2020-03-23 DIAGNOSIS — E7841 Elevated Lipoprotein(a): Secondary | ICD-10-CM

## 2020-03-23 MED ORDER — NEXLETOL 180 MG PO TABS: 1.0000 | ORAL_TABLET | Freq: Every day | ORAL | 3 refills | Status: DC

## 2020-03-23 NOTE — Patient Instructions (Signed)
Medication Instructions:  Continue current medications  *If you need a refill on your cardiac medications before your next appointment, please call your pharmacy*   Lab Work: FASTING lipid panel in 6 months to check cholesterol   If you have labs (blood work) drawn today and your tests are completely normal, you will receive your results only by: Marland Kitchen MyChart Message (if you have MyChart) OR . A paper copy in the mail If you have any lab test that is abnormal or we need to change your treatment, we will call you to review the results.   Testing/Procedures: NONE   Follow-Up: At [facility], you and your health needs are our priority.  As part of our continuing mission to provide you with exceptional heart care, we have created designated Provider Care Teams.  These Care Teams include your primary Cardiologist (physician) and Advanced Practice Providers (APPs -  Physician Assistants and Nurse Practitioners) who all work together to provide you with the care you need, when you need it.  We recommend signing up for the patient portal called "MyChart".  Sign up information is provided on this After Visit Summary.  MyChart is used to connect with patients for Virtual Visits (Telemedicine).  Patients are able to view lab/test results, encounter notes, upcoming appointments, etc.  Non-urgent messages can be sent to your provider as well.   To learn more about what you can do with MyChart, go to [email or web address].    Your next appointment:   6 month(s) - lipid clinic  The format for your next appointment:   Either In Person or Virtual  Provider:   K. Mali Hilty, MD   Other Instructions

## 2020-03-23 NOTE — Progress Notes (Signed)
Virtual Visit via Video Note   This visit type was conducted due to national recommendations for restrictions regarding the COVID-19 Pandemic (e.g. social distancing) in an effort to limit this patient's exposure and mitigate transmission in our community.  Due to his co-morbid illnesses, this patient is at least at moderate risk for complications without adequate follow up.  This format is felt to be most appropriate for this patient at this time.  All issues noted in this document were discussed and addressed.  A limited physical exam was performed with this format.  Please refer to the patient's chart for his consent to telehealth for [facility].   Evaluation Performed: Telephone follow-up  Date:  03/23/2020   ID:  Jose [name], DOB [birth year], MRN [id number]  Patient Location:  [address] [place] [address]  Provider location:   [address], [address] [place], [place] [address]  PCP:  [name], DO  Cardiologist:  [name], MD Electrophysiologist:  None   Chief Complaint:  No complaints  History of Present Illness:    Jose [name] is a 49 y.o. male who presents via audio/video conferencing for a telehealth visit today.  Mr. [name] was seen today for video follow-up.  He is a patient of 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  There is a significant family history of heart disease in his parents and a brother.  Recently a lipid NMR showed elevated particle numbers and LDL C of a 122.  APO B levels were also elevated.  He had an LPA which was elevated at 112.  He did not tolerate statins due to significant myalgias.  I therefore recommended PCSK9 inhibitor therapy.  He was very reluctant to start that.  Did obtain a calcium score which was the abnormal with a CAC of 22.  This was 77th percentile for age and sex matched control.  Based on these findings there is more evidence to  consider aggressive therapy since he has early onset heart disease.  08/29/2019  [name] returns today for follow-up.  Overall he reports doing well on Nexletol.  Unfortunately his insurance company did not cover the medication.  I believe they argue that he did not have cardiovascular disease however he does have an abnormal coronary calcium score of 22 indicating he does have ASCVD.  In addition he was intolerant to PCSK9 inhibitors and has been intolerant to statin therapy.  It was advised that he take ezetimibe however he has done very well with the Nexletol.  In fact he has almost no side effects.  His total cholesterol is now down to 170 and the LDL has come down to 84.  His triglycerides however did go up, which may be related to some dietary changes he made recently as well as decrease in activity.  Will continue to work on that.  03/23/2020  [name] was seen today via telephone follow-up.  He reports continued tolerance of bempedoic acid with good improvement in his lipids.  His most recent lipid profile shows some particle shifting with very little change in total cholesterol however triglycerides are come down to 163 from 334, small increase in HDL cholesterol from 31-33 and an increase in LDL cholesterol from 84-111.  This likely represents a calculated change, but I suspect his lipid particles have improved.  He has been exercising more although has been eating more meats and his diet is  not as optimal as it was 7 months ago.  The patient does not have symptoms concerning for COVID-19 infection (fever, chills, cough, or new SHORTNESS OF BREATH).    Prior CV studies:   The following studies were reviewed today:  Coronary calcium score  PMHx:  Past Medical History:  Diagnosis Date  . Crohn's disease ([place])   . Hyperlipidemia   . Thrombophlebitis     History reviewed. No pertinent surgical history.  FAMHx:  Family History  Problem Relation Age of Onset  . Heart  disease Mother     SOCHx:   reports that he has been smoking cigarettes. He has been smoking about 0.50 packs per day. He has never used smokeless tobacco. He reports that he does not drink alcohol and does not use drugs.  ALLERGIES:  Allergies  Allergen Reactions  . Statins Other (See Comments)    Chest pain  . Repatha [Evolocumab] Other (See Comments)    Muscle/Back Pain    MEDS:  Current Meds  Medication Sig  . Bempedoic Acid (NEXLETOL) 180 MG TABS Take 1 tablet by mouth daily.  . InFLIXimab (REMICADE IV) Inject into the vein every 8 (eight) weeks.    Alveda Reasons 20 MG TABS tablet TAKE 1 TABLET (20 MG TOTAL) DAILY WITH SUPPER BY MOUTH  . [DISCONTINUED] Bempedoic Acid (NEXLETOL) 180 MG TABS Take 1 tablet by mouth daily.     ROS: Pertinent items noted in HPI and remainder of comprehensive ROS otherwise negative.  Labs/Other Tests and Data Reviewed:    Recent Labs: 08/19/2019: ALT 51; BUN 23; Creatinine, Ser 1.21; Hemoglobin 13.9; Platelets 189; Potassium 4.4; Sodium 139   Recent Lipid Panel Lab Results  Component Value Date/Time   CHOL 173 03/16/2020 09:36 AM   TRIG 163 (H) 03/16/2020 09:36 AM   HDL 33 (L) 03/16/2020 09:36 AM   CHOLHDL 5.2 (H) 03/16/2020 09:36 AM   CHOLHDL 2.6 03/14/2016 09:01 AM   LDLCALC 111 (H) 03/16/2020 09:36 AM    Wt Readings from Last 3 Encounters:  03/23/20 191 lb (86.6 kg)  11/17/19 197 lb 14.4 oz (89.8 kg)  08/29/19 195 lb (88.5 kg)     Exam:    Vital Signs:  BP 129/70   Wt 191 lb (86.6 kg)   BMI 29.04 kg/m    General appearance: alert and no distress Lungs: No visual respiratory difficulty Abdomen: Normal weight Extremities: extremities normal, atraumatic, no cyanosis or edema Skin: Skin color, texture, turgor normal. No rashes or lesions Neurologic: Mental status: Alert, oriented, thought content appropriate Psych: Pleasant  ASSESSMENT & PLAN:    1. Dyslipidemia with elevated LP(a) 2. Strong family history of premature  coronary disease 3. Statin intolerance 4. Elevated CAC score 22 (10/2018) -sign of ASCVD 5. Intolerance to Repatha  [name] has done well on bempedoic acid but unfortunately cannot tolerate a PCSK9 inhibitor.  He did have an elevated LP(a), we might consider referral to a clinical trial evaluating anti-LP(a) therapy.  For now would continue his bempedoic acid and dietary interventions and reassess lipids in 6 months.  We could also consider adding ezetimibe at that time if he continues to be above target despite optimization of diet and exercise.  COVID-19 Education: The signs and symptoms of COVID-19 were discussed with the patient and how to seek care for testing (follow up with PCP or arrange E-visit).  The importance of social distancing was discussed today.  Patient Risk:   After full review of this patients clinical status,  I feel that they are at least moderate risk at this time.  Time:   Today, I have spent 15 minutes with the patient with telehealth technology discussing coronary artery calcium score, elevated LPA, PCSK9 inhibitor mechanisms of action, clinical trial data, research alternatives.     Medication Adjustments/Labs and Tests Ordered: Current medicines are reviewed at length with the patient today.  Concerns regarding medicines are outlined above.   Tests Ordered: Orders Placed This Encounter  Procedures  . NMR, lipoprofile    Medication Changes: Meds ordered this encounter  Medications  . Bempedoic Acid (NEXLETOL) 180 MG TABS    Sig: Take 1 tablet by mouth daily.    Dispense:  90 tablet    Refill:  3    Disposition:  in 6 month(s)  Pixie Casino, MD, [facility] [place] Director of the Advanced Lipid Disorders &  Cardiovascular Risk Reduction Clinic Diplomate of the American Board of Clinical Lipidology Attending Cardiologist  Direct Dial: [phone]  Fax: [phone]  Website:  [email or web address]  Pixie Casino, MD  03/23/2020 9:59 AM

## 2020-06-02 ENCOUNTER — Other Ambulatory Visit: Payer: Self-pay | Admitting: Oncology

## 2020-06-15 ENCOUNTER — Encounter: Payer: Self-pay | Admitting: Oncology

## 2020-06-19 ENCOUNTER — Inpatient Hospital Stay: Payer: 59 | Attending: Oncology | Admitting: Oncology

## 2020-06-19 DIAGNOSIS — I2699 Other pulmonary embolism without acute cor pulmonale: Secondary | ICD-10-CM | POA: Diagnosis not present

## 2020-06-19 NOTE — Progress Notes (Signed)
Hematology and Oncology Follow Up for Telemedicine Visits  Jose [name] [id number] 03/22/1971 49 y.o. 06/19/2020 8:24 AM Wendling, Crosby Oyster, DOWendling, Crosby Oyster*   I connected with Jose [name] on 06/19/20 at  9:00 AM EDT by video enabled telemedicine visit and verified that I am speaking with the correct person using two identifiers.   I discussed the limitations, risks, security and privacy concerns of performing an evaluation and management service by telemedicine and the availability of in-person appointments. I also discussed with the patient that there may be a patient responsible charge related to this service. The patient expressed understanding and agreed to proceed.  Other persons participating in the visit and their role in the encounter:  None  Patient's location:  Home Provider's location:  Office.    Principle Diagnosis: 49 year old man with pulmonary embolism diagnosed in 2018.  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  Hypercoagulable work-up has been unrevealing.    Current therapy: Xarelto started November 2018.  The duration of anticoagulation remain indefinite.   Interim History: Jose [name] reports no new issues or complaints.  He continues to tolerate Xarelto without any recent bleeding issues.  He denies any nausea, vomiting or abdominal pain.  His Crohn's disease is also under reasonable control.       Medications: Reviewed and updated. Current Outpatient Medications  Medication Sig Dispense Refill  . Bempedoic Acid (NEXLETOL) 180 MG TABS Take 1 tablet by mouth daily. 90 tablet 3  . InFLIXimab (REMICADE IV) Inject into the vein every 8 (eight) weeks.      Alveda Reasons 20 MG TABS tablet TAKE 1 TABLET (20 MG TOTAL) DAILY WITH SUPPER BY MOUTH 90 tablet 1   No current facility-administered medications for this visit.     Allergies:  Allergies  Allergen Reactions  . Statins Other (See Comments)    Chest pain   . Repatha [Evolocumab] Other (See Comments)    Muscle/Back Pain         Lab Results: Lab Results  Component Value Date   WBC 5.9 08/19/2019   HGB 13.9 08/19/2019   HCT 41.9 08/19/2019   MCV 88.8 08/19/2019   PLT 189 08/19/2019     Chemistry      Component Value Date/Time   NA 139 08/19/2019 0833   K 4.4 08/19/2019 0833   CL 108 08/19/2019 0833   CO2 24 08/19/2019 0833   BUN 23 (H) 08/19/2019 0833   CREATININE 1.21 08/19/2019 0833   CREATININE 1.44 (H) 07/19/2019 0913      Component Value Date/Time   CALCIUM 9.3 08/19/2019 0833   ALKPHOS 50 08/19/2019 0833   AST 33 08/19/2019 0833   AST 30 07/19/2019 0913   ALT 51 (H) 08/19/2019 0833   ALT 42 07/19/2019 0913   BILITOT 0.7 08/19/2019 0833   BILITOT 0.3 07/19/2019 0913         Impression and Plan:  49 year old man with:  1.  Venous thromboembolism presented with pulmonary embolism in 2018.  He has history of recurrent superficial venous thrombosis previously.  He is currently on Xarelto which she has tolerated without any major complications.  Risks and benefits of continuing Xarelto long-term versus discontinuation were reviewed today.  Given the fact that his thrombosis was unprovoked argument for long-term anticoagulation is warranted.  Complication associated with this approach including increased risk of bleeding was reviewed today.  After discussion today he is agreeable to continue on Xarelto.  Risk of thrombosis outweighs the risk  of bleeding at this time.  2.Crohn's disease: No recent exacerbation and he is up-to-date on his colonoscopies.  3.  Covid vaccination considerations: He has recovered from Covid a year ago and received 2 vaccination series.  4.  Follow-up: We will be in 8 months for repeat evaluation.   I discussed the assessment and treatment plan with the patient. The patient was provided an opportunity to ask questions and all were answered. The patient agreed with the plan and  demonstrated an understanding of the instructions.   The patient was advised to call back or seek an in-person evaluation if the symptoms worsen or if the condition fails to improve as anticipated.  I provided 30 minutes of face-to-face video visit time during this encounter.  The time was dedicated to reviewing his disease status, discussing treatment options and addressing complications related to his current therapy.  Zola Button, MD 06/19/2020 8:24 AM

## 2020-08-21 ENCOUNTER — Telehealth: Payer: Self-pay | Admitting: Internal Medicine

## 2020-08-21 NOTE — Telephone Encounter (Signed)
PA for nexletol submitted via CMM Key: [id number] - PA Case ID: [id number]

## 2020-08-21 NOTE — Telephone Encounter (Signed)
Request Reference Number: [id number]. NEXLETOL TAB 180MG  is approved through 08/21/2021.

## 2020-08-23 ENCOUNTER — Other Ambulatory Visit: Payer: Self-pay | Admitting: *Deleted

## 2020-08-23 DIAGNOSIS — E7841 Elevated Lipoprotein(a): Secondary | ICD-10-CM

## 2020-08-23 DIAGNOSIS — Z789 Other specified health status: Secondary | ICD-10-CM

## 2020-08-23 DIAGNOSIS — Z8249 Family history of ischemic heart disease and other diseases of the circulatory system: Secondary | ICD-10-CM

## 2020-08-23 DIAGNOSIS — E782 Mixed hyperlipidemia: Secondary | ICD-10-CM

## 2020-10-30 LAB — NMR, LIPOPROFILE
Cholesterol, Total: 170 mg/dL (ref 100–199)
HDL Particle Number: 27.7 umol/L — ABNORMAL LOW (ref >=30.5)
HDL-C: 32 mg/dL — ABNORMAL LOW (ref >39)
LDL Particle Number: 1376 nmol/L — ABNORMAL HIGH (ref <1000)
LDL Size: 20.9 nm (ref >20.5)
LDL-C (NIH Calc): 105 mg/dL — ABNORMAL HIGH (ref 0–99)
LP-IR Score: 82 — ABNORMAL HIGH (ref <=45)
Small LDL Particle Number: 757 nmol/L — ABNORMAL HIGH (ref <=527)
Triglycerides: 189 mg/dL — ABNORMAL HIGH (ref 0–149)

## 2020-10-31 ENCOUNTER — Telehealth: Payer: Self-pay | Admitting: *Deleted

## 2020-10-31 NOTE — Telephone Encounter (Signed)
Spoke with patient about follow up/cpe and making appointment.  Patient stated that he has been doing well and nothing has been wrong.  He will see the doctor if he is sick.

## 2020-12-07 ENCOUNTER — Other Ambulatory Visit: Payer: Self-pay | Admitting: Oncology

## 2021-03-16 ENCOUNTER — Other Ambulatory Visit: Payer: Self-pay | Admitting: Internal Medicine

## 2021-04-08 ENCOUNTER — Telehealth: Payer: Self-pay | Admitting: Radiology

## 2021-04-08 NOTE — Telephone Encounter (Signed)
error 

## 2021-05-18 ENCOUNTER — Encounter (HOSPITAL_BASED_OUTPATIENT_CLINIC_OR_DEPARTMENT_OTHER): Payer: Self-pay | Admitting: Emergency Medicine

## 2021-05-18 ENCOUNTER — Emergency Department (HOSPITAL_BASED_OUTPATIENT_CLINIC_OR_DEPARTMENT_OTHER): Payer: 59

## 2021-05-18 ENCOUNTER — Other Ambulatory Visit: Payer: Self-pay

## 2021-05-18 ENCOUNTER — Emergency Department (HOSPITAL_BASED_OUTPATIENT_CLINIC_OR_DEPARTMENT_OTHER)
Admission: EM | Admit: 2021-05-18 | Discharge: 2021-05-19 | Disposition: A | Discharge status: Home / Self Care | Payer: 59 | Attending: Emergency Medicine | Admitting: Emergency Medicine

## 2021-05-18 DIAGNOSIS — M545 Low back pain, unspecified: Secondary | ICD-10-CM

## 2021-05-18 DIAGNOSIS — W19XXXA Unspecified fall, initial encounter: Secondary | ICD-10-CM

## 2021-05-18 DIAGNOSIS — Z87891 Personal history of nicotine dependence: Secondary | ICD-10-CM | POA: Diagnosis not present

## 2021-05-18 DIAGNOSIS — Z7901 Long term (current) use of anticoagulants: Secondary | ICD-10-CM | POA: Diagnosis not present

## 2021-05-18 DIAGNOSIS — R519 Headache, unspecified: Secondary | ICD-10-CM | POA: Insufficient documentation

## 2021-05-18 LAB — CBC WITH DIFFERENTIAL/PLATELET
Abs Immature Granulocytes: 0.1 10*3/uL — ABNORMAL HIGH (ref 0.00–0.07)
Basophils Absolute: 0.1 10*3/uL (ref 0.0–0.1)
Basophils Relative: 1 %
Eosinophils Absolute: 0.2 10*3/uL (ref 0.0–0.5)
Eosinophils Relative: 2 %
HCT: 39.6 % (ref 39.0–52.0)
Hemoglobin: 13.6 g/dL (ref 13.0–17.0)
Immature Granulocytes: 1 %
Lymphocytes Relative: 19 %
Lymphs Abs: 1.9 10*3/uL (ref 0.7–4.0)
MCH: 29.8 pg (ref 26.0–34.0)
MCHC: 34.3 g/dL (ref 30.0–36.0)
MCV: 86.8 fL (ref 80.0–100.0)
Monocytes Absolute: 1 10*3/uL (ref 0.1–1.0)
Monocytes Relative: 10 %
Neutro Abs: 7 10*3/uL (ref 1.7–7.7)
Neutrophils Relative %: 67 %
Platelets: 223 10*3/uL (ref 150–400)
RBC: 4.56 MIL/uL (ref 4.22–5.81)
RDW: 12.8 % (ref 11.5–15.5)
WBC: 10.2 10*3/uL (ref 4.0–10.5)
nRBC: 0 % (ref 0.0–0.2)

## 2021-05-18 LAB — COMPREHENSIVE METABOLIC PANEL
ALT: 31 U/L (ref 0–44)
AST: 43 U/L — ABNORMAL HIGH (ref 15–41)
Albumin: 4.3 g/dL (ref 3.5–5.0)
Alkaline Phosphatase: 55 U/L (ref 38–126)
Anion gap: 9 (ref 5–15)
BUN: 34 mg/dL — ABNORMAL HIGH (ref 6–20)
CO2: 25 mmol/L (ref 22–32)
Calcium: 9.7 mg/dL (ref 8.9–10.3)
Chloride: 105 mmol/L (ref 98–111)
Creatinine, Ser: 1.65 mg/dL — ABNORMAL HIGH (ref 0.61–1.24)
GFR, Estimated: 50 mL/min — ABNORMAL LOW (ref >60)
Glucose, Bld: 118 mg/dL — ABNORMAL HIGH (ref 70–99)
Potassium: 3.9 mmol/L (ref 3.5–5.1)
Sodium: 139 mmol/L (ref 135–145)
Total Bilirubin: 0.2 mg/dL — ABNORMAL LOW (ref 0.3–1.2)
Total Protein: 7.1 g/dL (ref 6.5–8.1)

## 2021-05-18 LAB — PROTIME-INR
INR: 1.3 — ABNORMAL HIGH (ref 0.8–1.2)
Prothrombin Time: 16.2 seconds — ABNORMAL HIGH (ref 11.4–15.2)

## 2021-05-18 MED ORDER — ACETAMINOPHEN 325 MG PO TABS
650.0000 mg | ORAL_TABLET | Freq: Once | ORAL | Status: AC
  Administered 2021-05-18: 650 mg via ORAL
  Filled 2021-05-18: qty 2

## 2021-05-18 MED ORDER — METHOCARBAMOL 500 MG PO TABS
750.0000 mg | ORAL_TABLET | Freq: Once | ORAL | Status: AC
  Administered 2021-05-18: 750 mg via ORAL
  Filled 2021-05-18: qty 2

## 2021-05-18 NOTE — ED Triage Notes (Signed)
Pt reports fell off a horse about 1 hr ago, landing on LT side back, c/o pain to entire lower back and numbness to RT posterior thigh

## 2021-05-18 NOTE — Discharge Instructions (Addendum)
As we discussed, I recommend rest, icing the swollen area of your back. Please use Tylenol or ibuprofen for pain.  You may use 600 mg ibuprofen every 6 hours or 1000 mg of Tylenol every 6 hours.  You may choose to alternate between the 2.  This would be most effective.  Not to exceed 4 g of Tylenol within 24 hours.  Not to exceed 3200 mg ibuprofen 24 hours.  Please follow up with orthopedics as we discussed.

## 2021-05-18 NOTE — ED Provider Notes (Signed)
[facility] Provider Note   CSN: [id number] Arrival date & time: 05/18/21  2053     History Chief Complaint  Patient presents with   Jose [name] is a 50 y.o. male with a past medical history significant for previous lumbar back pain, with sciatica symptoms who presents after falling off his horse this afternoon, sustaining a fall primarily to his buttock and lower back, but with a minor head injury as well.  Patient endorses he is on Xarelto from prior pulmonary embolism.  Patient denies numbness, tingling, weakness of any limb.  He denies confusion, loss of consciousness, blurring vision, slurred speech.  He endorses pain over his lower back, as well as a shooting electric, or numb pain down his right leg when he walks.  Patient denies saddle anesthesia, urinary retention, difficulty passing stool.   Fall      Past Medical History:  Diagnosis Date   Crohn's disease ([place])    Hyperlipidemia    Thrombophlebitis     Patient Active Problem List   Diagnosis Date Noted   Thyromegaly 07/27/2019   Hyperlipidemia 10/20/2018   Elevated lipoprotein(a) 10/20/2018   PE (pulmonary thromboembolism) ([place]) 07/18/2017   Family history of coronary artery disease 01/18/2015   Dyspnea 01/18/2015   Tobacco use 01/18/2015   Osteopenia 10/15/2013   Crohn's disease ([place])    Regional enteritis ([place]) 02/17/2007    History reviewed. No pertinent surgical history.     Family History  Problem Relation Age of Onset   Heart disease Mother     Social History   Tobacco Use   Smoking status: Former    Packs/day: 0.50    Types: Cigarettes   Smokeless tobacco: Never  Vaping Use   Vaping Use: Never used  Substance Use Topics   Alcohol use: No   Drug use: No    Home Medications Prior to Admission medications   Medication Sig Start Date End Date Taking? Authorizing Provider  InFLIXimab (REMICADE IV) Inject into the vein every 8 (eight) weeks.       [provider]  NEXLETOL 180 MG TABS TAKE 1 TABLET BY MOUTH EVERY DAY 03/20/21   Hilty, Nadean Corwin, MD  XARELTO 20 MG TABS tablet TAKE 1 TABLET (20 MG TOTAL) DAILY WITH SUPPER BY MOUTH 12/07/20   Wyatt Portela, MD    Allergies    Statins and Repatha [evolocumab]  Review of Systems   Review of Systems  Musculoskeletal:  Positive for back pain and gait problem.  All other systems reviewed and are negative.  Physical Exam Updated Vital Signs Ht 5' 8"  (1.727 m)   Wt 88.5 kg   BMI 29.65 kg/m   Physical Exam Vitals and nursing note reviewed.  Constitutional:      General: He is not in acute distress.    Appearance: Normal appearance. He is well-developed.  HENT:     Head: Normocephalic and atraumatic.  Eyes:     General:        Right eye: No discharge.        Left eye: No discharge.     Conjunctiva/sclera: Conjunctivae normal.  Cardiovascular:     Rate and Rhythm: Normal rate and regular rhythm.     Heart sounds: No murmur heard.   No friction rub. No gallop.  Pulmonary:     Effort: Pulmonary effort is normal. No respiratory distress.     Breath sounds: Normal breath sounds.  Abdominal:  General: Bowel sounds are normal.     Palpations: Abdomen is soft.     Tenderness: There is no abdominal tenderness.  Musculoskeletal:     Cervical back: Neck supple. No rigidity.     Comments: No midline spinal tenderness through cervical, thoracic, and lumbar spine, some tenderness over coccyx.  Red swollen area on paraspinous muscles of left lower back with some minor scrapes.  Negative straight leg raise  Skin:    General: Skin is warm and dry.     Capillary Refill: Capillary refill takes less than 2 seconds.  Neurological:     Mental Status: He is alert and oriented to person, place, and time.     Comments: CN III through XII grossly intact.  Strength 5 out of 5 in bilateral upper and lower extremities.  No deficits to sensation throughout body.  Romberg negative, gait  is normal if some hesitancy with pain.  Psychiatric:        Mood and Affect: Mood normal.        Behavior: Behavior normal.    ED Results / Procedures / Treatments   Labs (all labs ordered are listed, but only abnormal results are displayed) Labs Reviewed  COMPREHENSIVE METABOLIC PANEL  CBC WITH DIFFERENTIAL/PLATELET  PROTIME-INR    EKG None  Radiology CT HEAD WO CONTRAST (5MM)  Result Date: 05/18/2021 CLINICAL DATA:  Head trauma, mod-severe Fall. EXAM: CT HEAD WITHOUT CONTRAST TECHNIQUE: Contiguous axial images were obtained from the base of the skull through the vertex without intravenous contrast. COMPARISON:  None. FINDINGS: Brain: No intracranial hemorrhage, mass effect, or midline shift. No hydrocephalus. The basilar cisterns are patent. No evidence of territorial infarct or acute ischemia. No extra-axial or intracranial fluid collection. Vascular: No hyperdense vessel or unexpected calcification. Skull: Normal. Negative for fracture or focal lesion. Sinuses/Orbits: Paranasal sinuses and mastoid air cells are clear. The visualized orbits are unremarkable. Other: None. IMPRESSION: Negative noncontrast head CT. Electronically Signed   By: Keith Rake M.D.   On: 05/18/2021 21:53    Procedures Procedures   Medications Ordered in ED Medications - No data to display  ED Course  I have reviewed the triage vital signs and the nursing notes.  Pertinent labs & imaging results that were available during my care of the patient were reviewed by me and considered in my medical decision making (see chart for details).    MDM Rules/Calculators/A&P                         I discussed this case with my attending physician who cosigned this note including patient's presenting symptoms, physical exam, and planned diagnostics and interventions. Attending physician stated agreement with plan or made changes to plan which were implemented.   CT Head negative for intracranial bleed. Lumbar  spine radiographs are negative for any acute abnormality. No sign of hematoma at any location. Some redness and swelling of paraspinous region on the left. No signs of cauda equina. No neurologic deficit at any neurologic level. Based on patient description of radiating shooting pain down leg, favor some exacerbation of previous sciatic nerve pain. No worry for acute spinal pathology at this time.  Recommend follow up with orthopedics for back and gait problems and supportive care with ibuprofen and tylenol in the meantime. Extensive return precautions given.  Final Clinical Impression(s) / ED Diagnoses Final diagnoses:  None    Rx / DC Orders ED Discharge Orders     None  Dorien Chihuahua 05/19/21 0008    Truddie Hidden, MD 05/19/21 [phone]

## 2021-05-28 ENCOUNTER — Other Ambulatory Visit: Payer: Self-pay | Admitting: Oncology

## 2021-09-04 ENCOUNTER — Telehealth: Payer: Self-pay | Admitting: Internal Medicine

## 2021-09-04 NOTE — Telephone Encounter (Signed)
Request Reference Number: [id number]. NEXLETOL TAB 180MG  is approved through 09/04/2022

## 2021-09-04 NOTE — Telephone Encounter (Signed)
PA for nexletol submitted via CMM (Key: [id number])

## 2021-09-15 ENCOUNTER — Other Ambulatory Visit: Payer: Self-pay | Admitting: Oncology

## 2022-03-26 ENCOUNTER — Other Ambulatory Visit: Payer: Self-pay | Admitting: Internal Medicine

## 2022-06-05 ENCOUNTER — Other Ambulatory Visit: Payer: Self-pay | Admitting: Oncology

## 2022-06-05 DIAGNOSIS — I2699 Other pulmonary embolism without acute cor pulmonale: Secondary | ICD-10-CM

## 2022-09-01 ENCOUNTER — Telehealth: Payer: Self-pay | Admitting: Internal Medicine

## 2022-09-01 DIAGNOSIS — E782 Mixed hyperlipidemia: Secondary | ICD-10-CM

## 2022-09-01 NOTE — Telephone Encounter (Signed)
PA for nexletol submitted via CMM (Key: [id number])

## 2022-09-02 NOTE — Telephone Encounter (Signed)
Request Reference Number: [id number]. NEXLETOL TAB 180MG is approved through 09/02/2023. Your patient may now fill this prescription and it will be covered.

## 2022-09-16 ENCOUNTER — Telehealth: Payer: Self-pay | Admitting: Internal Medicine

## 2022-09-16 NOTE — Telephone Encounter (Signed)
Patient calling to speak with Eliezer Lofts, Dr. Lysbeth Penner nurse. Advised she is off and will be back tomorrow - pt would like a call back. Did not want me to take a message for him in regards to his call.

## 2022-09-17 NOTE — Telephone Encounter (Signed)
Spoke with patient of Dr. Debara Pickett He is wanting to schedule his overdue 1 year visit Scheduled for visit visit on 11/05/22 @ 930am  Will mail lab order

## 2022-10-12 ENCOUNTER — Other Ambulatory Visit: Payer: Self-pay | Admitting: Internal Medicine

## 2022-10-31 LAB — NMR, LIPOPROFILE
Cholesterol, Total: 168 mg/dL (ref 100–199)
HDL Particle Number: 27.2 umol/L — ABNORMAL LOW (ref >=30.5)
HDL-C: 35 mg/dL — ABNORMAL LOW (ref >39)
LDL Particle Number: 1203 nmol/L — ABNORMAL HIGH (ref <1000)
LDL Size: 20.5 nm — ABNORMAL LOW (ref >20.5)
LDL-C (NIH Calc): 103 mg/dL — ABNORMAL HIGH (ref 0–99)
LP-IR Score: 75 — ABNORMAL HIGH (ref <=45)
Small LDL Particle Number: 780 nmol/L — ABNORMAL HIGH (ref <=527)
Triglycerides: 170 mg/dL — ABNORMAL HIGH (ref 0–149)

## 2022-11-05 ENCOUNTER — Ambulatory Visit: Payer: 59 | Attending: Cardiology | Admitting: Internal Medicine

## 2022-11-05 ENCOUNTER — Encounter: Payer: Self-pay | Admitting: Internal Medicine

## 2022-11-05 VITALS — Wt 190.0 lb

## 2022-11-05 DIAGNOSIS — R931 Abnormal findings on diagnostic imaging of heart and coronary circulation: Secondary | ICD-10-CM

## 2022-11-05 DIAGNOSIS — M791 Myalgia, unspecified site: Secondary | ICD-10-CM

## 2022-11-05 DIAGNOSIS — E785 Hyperlipidemia, unspecified: Secondary | ICD-10-CM | POA: Diagnosis not present

## 2022-11-05 DIAGNOSIS — T466X5A Adverse effect of antihyperlipidemic and antiarteriosclerotic drugs, initial encounter: Secondary | ICD-10-CM

## 2022-11-05 DIAGNOSIS — E7841 Elevated Lipoprotein(a): Secondary | ICD-10-CM

## 2022-11-05 NOTE — Patient Instructions (Signed)
Medication Instructions:  NO CHANGES  *If you need a refill on your cardiac medications before your next appointment, please call your pharmacy*   Lab Work: FASTING lab work to check cholesterol in 6 months -- NMR lipoprofile  If you have labs (blood work) drawn today and your tests are completely normal, you will receive your results only by: [place] (if you have MyChart) OR A paper copy in the mail If you have any lab test that is abnormal or we need to change your treatment, we will call you to review the results.   Follow-Up: At [facility], you and your health needs are our priority.  As part of our continuing mission to provide you with exceptional heart care, we have created designated Provider Care Teams.  These Care Teams include your primary Cardiologist (physician) and Advanced Practice Providers (APPs -  Physician Assistants and Nurse Practitioners) who all work together to provide you with the care you need, when you need it.  We recommend signing up for the patient portal called "MyChart".  Sign up information is provided on this After Visit Summary.  MyChart is used to connect with patients for Virtual Visits (Telemedicine).  Patients are able to view lab/test results, encounter notes, upcoming appointments, etc.  Non-urgent messages can be sent to your provider as well.   To learn more about what you can do with MyChart, go to [email or web address].    Your next appointment:   6 month(s)  Provider:   Lyman Bishop MD - lipid clinic

## 2022-11-05 NOTE — Progress Notes (Signed)
Virtual Visit via Video Note   This visit type was conducted due to national recommendations for restrictions regarding the COVID-19 Pandemic (e.g. social distancing) in an effort to limit this patient's exposure and mitigate transmission in our community.  Due to his co-morbid illnesses, this patient is at least at moderate risk for complications without adequate follow up.  This format is felt to be most appropriate for this patient at this time.  All issues noted in this document were discussed and addressed.  A limited physical exam was performed with this format.  Please refer to the patient's chart for his consent to telehealth for [facility].   Evaluation Performed: Video follow-up  Date:  11/05/2022   ID:  Jose [name], DOB [birth year], MRN [id number]  Patient Location:  [place] [address]  Provider location:   [address] [place], [place] [address]  PCP:  No primary care provider on file.  Cardiologist:  [name], MD Electrophysiologist:  None   Chief Complaint:  No complaints  History of Present Illness:    Jose [name] is a 52 y.o. male who presents via audio/video conferencing for a telehealth visit today.  Jose [name] was seen today for video follow-up.  He is a patient of 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  There is a significant family history of heart disease in his parents and a brother.  Recently a lipid NMR showed elevated particle numbers and LDL C of a 122.  APO B levels were also elevated.  He had an LPA which was elevated at 112.  He did not tolerate statins due to significant myalgias.  I therefore recommended PCSK9 inhibitor therapy.  He was very reluctant to start that.  Did obtain a calcium score which was the abnormal with a CAC of 22.  This was 77th percentile for age and sex matched control.  Based on these findings there is more  evidence to consider aggressive therapy since he has early onset heart disease.  08/29/2019  Jose [name] returns today for follow-up.  Overall he reports doing well on Nexletol.  Unfortunately his insurance company did not cover the medication.  I believe they argue that he did not have cardiovascular disease however he does have an abnormal coronary calcium score of 22 indicating he does have ASCVD.  In addition he was intolerant to PCSK9 inhibitors and has been intolerant to statin therapy.  It was advised that he take ezetimibe however he has done very well with the Nexletol.  In fact he has almost no side effects.  His total cholesterol is now down to 170 and the LDL has come down to 84.  His triglycerides however did go up, which may be related to some dietary changes he made recently as well as decrease in activity.  Will continue to work on that.  03/23/2020  Jose [name] was seen today via telephone follow-up.  He reports continued tolerance of bempedoic acid with good improvement in his lipids.  His most recent lipid profile shows some particle shifting with very little change in total cholesterol however triglycerides are come down to 163 from 334, small increase in HDL cholesterol from 31-33 and an increase in LDL cholesterol from 84-111.  This likely represents a calculated change, but I suspect his lipid particles have improved.  He has been exercising more although has been eating more meats and his  diet is not as optimal as it was 7 months ago.  11/05/2022  Jose [name] is seen today for follow-up.  Overall he says he is feeling well.  I last saw him about 3 years ago.  He has remained on Nexletol.  Unfortunately could not tolerate Repatha due to side effects.  He could also not tolerate statins.  His lipids have improved although have been generally stable over the past 2 years.  Recent NMR showed an LDL particle number of 1203, LDL-C of 103, HDL-C was 35 and triglycerides were 170.   Small LDL particle number of 780.  Overall he remains still above target with a goal LDL less than 70 and particle numbers ideally less than 800.  I discussed this with him today especially in light of an elevated calcium score which was 77th percentile in 2020.  He said he is committed to trying new diet and exercise options.  He is also noted to have an elevated LP(a) of around 125 nmol/L, which would not be affected by diet or exercise.  Prior CV studies:   The following studies were reviewed today:  Coronary calcium score  PMHx:  Past Medical History:  Diagnosis Date   Crohn's disease ([place])    Hyperlipidemia    Thrombophlebitis     History reviewed. No pertinent surgical history.  FAMHx:  Family History  Problem Relation Age of Onset   Heart disease Mother     SOCHx:   reports that he has quit smoking. His smoking use included cigarettes. He smoked an average of .5 packs per day. He has never used smokeless tobacco. He reports that he does not drink alcohol and does not use drugs.  ALLERGIES:  Allergies  Allergen Reactions   Statins Other (See Comments)    Chest pain   Repatha [Evolocumab] Other (See Comments)    Muscle/Back Pain    MEDS:  Current Meds  Medication Sig   inFLIXimab-axxq (AVSOLA) 100 MG injection Inject 100 mg into the vein every 8 (eight) weeks.   Multiple Vitamin (MULTIVITAMIN) capsule Take 1 capsule by mouth daily.   NEXLETOL 180 MG TABS TAKE 1 TABLET BY MOUTH EVERY DAY   XARELTO 20 MG TABS tablet TAKE 1 TABLET (20 MG TOTAL) DAILY WITH SUPPER BY MOUTH   [DISCONTINUED] InFLIXimab (REMICADE IV) Inject into the vein every 8 (eight) weeks.       ROS: Pertinent items noted in HPI and remainder of comprehensive ROS otherwise negative.  Labs/Other Tests and Data Reviewed:    Recent Labs: No results found for requested labs within last 365 days.   Recent Lipid Panel Lab Results  Component Value Date/Time   CHOL 173 03/16/2020 09:36 AM   TRIG 163  (H) 03/16/2020 09:36 AM   HDL 33 (L) 03/16/2020 09:36 AM   CHOLHDL 5.2 (H) 03/16/2020 09:36 AM   CHOLHDL 2.6 03/14/2016 09:01 AM   LDLCALC 111 (H) 03/16/2020 09:36 AM    Wt Readings from Last 3 Encounters:  11/05/22 190 lb (86.2 kg)  05/18/21 195 lb (88.5 kg)  03/23/20 191 lb (86.6 kg)     Exam:    Vital Signs:  Wt 190 lb (86.2 kg)   BMI 28.89 kg/m    General appearance: alert and no distress Lungs: No visual respiratory difficulty Abdomen: Normal weight Extremities: extremities normal, atraumatic, no cyanosis or edema Skin: Skin color, texture, turgor normal. No rashes or lesions Neurologic: Mental status: Alert, oriented, thought content appropriate Psych: Pleasant  ASSESSMENT &  PLAN:    Dyslipidemia with elevated LP(a) of 125.8 nmol/L Strong family history of premature coronary disease Statin intolerance Elevated CAC score 22 (10/2018) -signs of ASCVD Intolerance to Repatha  Jose [name] has a mixed dyslipidemia which is improved on Nexletol however is still above target.  Options might include adding ezetimibe however this would not address his elevated LP(a).  Another option might be Leqvio which is newly available since we last spoke.  This works differently than the antibody PCSK9 inhibitor such as Repatha and is much better tolerated and delivered via injections every 6 months.  We would have to seek out prior authorization for the medication.  Cost may be an issue.  He does want to investigate this a little further.  He would also like a trial of exercise and diet to see if there are any changes to his NMR.  Will plan to repeat in 6 months and follow-up with me at that time.  COVID-19 Education: The signs and symptoms of COVID-19 were discussed with the patient and how to seek care for testing (follow up with PCP or arrange E-visit).  The importance of social distancing was discussed today.  Patient Risk:   After full review of this patients clinical status, I feel  that they are at least moderate risk at this time.  Time:   Today, I have spent 15 minutes with the patient with telehealth technology discussing coronary artery calcium score, elevated LPA, PCSK9 inhibitor mechanisms of action, clinical trial data, research alternatives.     Medication Adjustments/Labs and Tests Ordered: Current medicines are reviewed at length with the patient today.  Concerns regarding medicines are outlined above.   Tests Ordered: No orders of the defined types were placed in this encounter.   Medication Changes: No orders of the defined types were placed in this encounter.   Disposition:  in 6 month(s)  Pixie Casino, MD, [facility], [place] Director of the [facility]  [facility] Diplomate of the American Board of Clinical Lipidology Attending Cardiologist  Direct Dial: [phone]  Fax: [phone]  Website:  [email or web address]  Pixie Casino, MD  11/05/2022 9:28 AM

## 2022-12-25 ENCOUNTER — Telehealth: Payer: Self-pay

## 2022-12-25 NOTE — Telephone Encounter (Signed)
received a call from this pt requesting a refill for his Xarelto. He was a [place] pt and has not been seen since 2021. He is out of his medication and agreed to an appointment with a new provider but needs a refill ASAP.  Per Dr Kale/Dr on call:   he was to f/u in 8 months after his last appointment in 06/2020 and has not followed for last >2.37yrs so he is not currently an active patient with the clinic. He can call his PCP and f/u with PCP for continued anticoagulation.   LM for pt with above recommendations from Dr Candise Che.

## 2022-12-26 ENCOUNTER — Telehealth: Payer: Self-pay | Admitting: Family Medicine

## 2022-12-26 NOTE — Telephone Encounter (Signed)
Pt would like to reestablish care with Dr. Carmelia Roller. Please advise if okay to reestablish care.

## 2023-01-09 ENCOUNTER — Ambulatory Visit: Payer: 59 | Admitting: Family Medicine

## 2023-01-09 ENCOUNTER — Encounter: Payer: Self-pay | Admitting: Family Medicine

## 2023-01-09 VITALS — BP 108/68 | HR 75 | Temp 98.3°F | Ht 68.0 in | Wt 182.4 lb

## 2023-01-09 DIAGNOSIS — Z86711 Personal history of pulmonary embolism: Secondary | ICD-10-CM | POA: Diagnosis not present

## 2023-01-09 DIAGNOSIS — K219 Gastro-esophageal reflux disease without esophagitis: Secondary | ICD-10-CM | POA: Diagnosis not present

## 2023-01-09 MED ORDER — OMEPRAZOLE 40 MG PO CPDR: 40.0000 mg | DELAYED_RELEASE_CAPSULE | Freq: Every day | ORAL | 3 refills | Status: DC

## 2023-01-09 MED ORDER — RIVAROXABAN 20 MG PO TABS: ORAL_TABLET | ORAL | 3 refills | Status: DC

## 2023-01-09 NOTE — Patient Instructions (Signed)
Keep the diet clean and stay active.  The only lifestyle changes that have data behind them are weight loss for the overweight/obese and elevating the head of the bed. Finding out which foods/positions are triggers is important.  Let us know if you need anything.    

## 2023-01-09 NOTE — Progress Notes (Signed)
Chief Complaint  Patient presents with   reestablish care    Discuss xarelto    Subjective: Patient is a 52 y.o. male here for reestablishing care.  Patient has a history of a DVT leading to a pulmonary embolism.  Since that time, he has been taking Xarelto 20 mg daily.  He reports compliance and no adverse effects.  No blood in the stool or urine.  No shortness of breath or chest pain.  Patient has a history of reflux.  He takes omeprazole 20 mg daily.  When he was on the prescription dosage, he did not have as many symptoms.  He denies difficulty swallowing, unintentional weight loss, or blood in his stool.  Past Medical History:  Diagnosis Date   Crohn's disease (HCC)    Hyperlipidemia    Thrombophlebitis     Objective: BP 108/68 (BP Location: Left Arm, Patient Position: Sitting, Cuff Size: Normal)   Pulse 75   Temp 98.3 F (36.8 C) (Oral)   Ht 5\' 8"  (1.727 m)   Wt 182 lb 6 oz (82.7 kg)   SpO2 98%   BMI 27.73 kg/m  General: Awake, appears stated age Heart: RRR, no LE edema Lungs: CTAB, no rales, wheezes or rhonchi. No accessory muscle use Abdomen: Bowel sounds present, soft, nontender, nondistended Psych: Age appropriate judgment and insight, normal affect and mood  Assessment and Plan: History of pulmonary embolus (PE) - Plan: rivaroxaban (XARELTO) 20 MG TABS tablet  Gastroesophageal reflux disease, unspecified whether esophagitis present - Plan: omeprazole (PRILOSEC) 40 MG capsule  Chronic, stable.  Continue Xarelto 20 mg daily. Chronic, not currently stable.  Increase omeprazole from 20 mg daily to 40 mg daily.  Counseled on reflux precautions.  No red flag signs or symptoms. Follow-up in 6 months for physical or as needed. The patient voiced understanding and agreement to the plan.  Jilda Roche [place], DO 01/09/23  11:49 AM

## 2023-03-25 ENCOUNTER — Encounter: Payer: Self-pay | Admitting: Family Medicine

## 2023-03-25 ENCOUNTER — Ambulatory Visit: Payer: 59 | Admitting: Family Medicine

## 2023-03-25 VITALS — BP 130/82 | HR 82 | Temp 98.0°F | Ht 68.0 in | Wt 187.1 lb

## 2023-03-25 DIAGNOSIS — L739 Follicular disorder, unspecified: Secondary | ICD-10-CM

## 2023-03-25 MED ORDER — DOXYCYCLINE HYCLATE 100 MG PO TABS: 100.0000 mg | ORAL_TABLET | Freq: Two times a day (BID) | ORAL | 0 refills | Status: DC

## 2023-03-25 NOTE — Progress Notes (Signed)
Chief Complaint  Patient presents with   Rash    Under left arm for the past 3 to 4 days      Jose [name] is a 52 y.o. male here for a skin complaint.  Duration: 4 days Location: L armpit Pruritic? Yes Painful? Yes Drainage? Yes- pus New soaps/lotions/topicals/detergents? No Sick contacts? No Other associated symptoms: he is on Remicade, hx of MRSA;  Therapies tried thus far: antibact soap.   Past Medical History:  Diagnosis Date   Crohn's disease (HCC)    Hyperlipidemia    Thrombophlebitis     BP 130/82 (BP Location: Left Arm, Patient Position: Sitting, Cuff Size: Normal)   Pulse 82   Temp 98 F (36.7 C) (Oral)   Ht 5\' 8"  (1.727 m)   Wt 187 lb 2 oz (84.9 kg)   SpO2 98%   BMI 28.45 kg/m  Gen: awake, alert, appearing stated age Lungs: No accessory muscle use Skin: L axillae: erythema with focus over follicles, mild ttp; no pustules, no drainage, fluctuance Psych: Age appropriate judgment and insight  Folliculitis - Plan: doxycycline (VIBRA-TABS) 100 MG tablet  10 d of doxy. Warning signs and symptoms verbalized and written down in AVS. No signs of fluctuance/abscess.  F/u prn. The patient voiced understanding and agreement to the plan.  Jilda Roche [place], DO 03/25/23 2:05 PM

## 2023-03-25 NOTE — Patient Instructions (Signed)
Try not to scratch as this can make things worse. Avoid scented products while dealing with this. You may resume when the itchiness resolves. Cold/cool compresses can help.    When you do wash it, use only soap and water. Do not vigorously scrub.  Things to look out for: increasing pain not relieved by ibuprofen/acetaminophen, fevers, spreading redness, increased drainage of pus, or foul odor.  OK to take Tylenol 1000 mg (2 extra strength tabs) or 975 mg (3 regular strength tabs) every 6 hours as needed.  Ice/cold pack over area for 10-15 min twice daily.  Warm compresses may be helpful also.   Let us know if you need anything.

## 2023-04-11 ENCOUNTER — Other Ambulatory Visit: Payer: Self-pay | Admitting: Internal Medicine

## 2023-05-16 ENCOUNTER — Other Ambulatory Visit: Payer: Self-pay | Admitting: Family Medicine

## 2023-05-16 DIAGNOSIS — L739 Follicular disorder, unspecified: Secondary | ICD-10-CM

## 2023-05-19 NOTE — Telephone Encounter (Signed)
Was prescribed at his last visit for rash under arms. He took a couple days, then went out of town just got back and cannot find the prescription

## 2023-07-14 ENCOUNTER — Ambulatory Visit (INDEPENDENT_AMBULATORY_CARE_PROVIDER_SITE_OTHER): Payer: 59 | Admitting: Family Medicine

## 2023-07-14 ENCOUNTER — Encounter: Payer: Self-pay | Admitting: Family Medicine

## 2023-07-14 VITALS — BP 130/80 | HR 78 | Temp 98.0°F | Resp 16 | Ht 68.0 in | Wt 188.0 lb

## 2023-07-14 DIAGNOSIS — L409 Psoriasis, unspecified: Secondary | ICD-10-CM | POA: Insufficient documentation

## 2023-07-14 MED ORDER — CLOBETASOL PROPIONATE 0.05 % EX CREA: TOPICAL_CREAM | CUTANEOUS | 5 refills | Status: DC

## 2023-07-14 MED ORDER — CETIRIZINE HCL 10 MG PO TABS: 10.0000 mg | ORAL_TABLET | Freq: Every day | ORAL | 3 refills | Status: DC

## 2023-07-14 MED ORDER — MUPIROCIN 2 % EX OINT: TOPICAL_OINTMENT | CUTANEOUS | 3 refills | Status: DC

## 2023-07-14 NOTE — Progress Notes (Signed)
Chief Complaint  Patient presents with   Follow-up    Follow up on rash    Jose [name] is a 52 y.o. male here for a skin complaint.  Duration: several months Location: Left axilla Pruritic? Yes Painful? No Drainage? No New soaps/lotions/topicals/detergents? No Sick contacts? No Other associated symptoms: He saw [place] back in July and had some pain.  He is placed on doxycycline which did help.  It did not clear up the wider rash.  He is on a biologic from the gastroenterology team for his Crohn's.  He was changed from Remicade to Avsola.  After that change, he started having more flares of his psoriasis.  He was treated with a topical steroid from the dermatology team which did help his hands. Therapies tried thus far: Steroid cream for his hands, nothing in the armpit region  Past Medical History:  Diagnosis Date   Crohn's disease (HCC)    Hyperlipidemia    Thrombophlebitis     BP 130/80 (BP Location: Left Arm, Patient Position: Sitting, Cuff Size: Normal)   Pulse 78   Temp 98 F (36.7 C) (Oral)   Resp 16   Ht 5\' 8"  (1.727 m)   Wt 188 lb (85.3 kg)   SpO2 96%   BMI 28.59 kg/m  Gen: awake, alert, appearing stated age Lungs: No accessory muscle use Skin: See below. No drainage, TTP, fluctuance, excoriation Psych: Age appropriate judgment and insight   Left axilla  Psoriasis - Plan: clobetasol cream (TEMOVATE) 0.05 %, mupirocin ointment (BACTROBAN) 2 %  Topical clobetasol for breakthrough, including the exacerbation today.  Refills provided.  He will sometimes get excoriation from psoriasis flares.  He uses mupirocin which is helpful.  We will refill this as well.  Avoid scented products and scratching when possible. F/u in 6 months for a physical. The patient voiced understanding and agreement to the plan.  Jilda Roche [place], DO 07/14/23 9:00 AM

## 2023-07-14 NOTE — Patient Instructions (Signed)
Let me know if this becomes more widespread.   Let us know if you need anything.

## 2023-07-24 ENCOUNTER — Encounter: Payer: Self-pay | Admitting: Internal Medicine

## 2023-08-05 ENCOUNTER — Other Ambulatory Visit (HOSPITAL_COMMUNITY): Payer: Self-pay

## 2023-10-07 ENCOUNTER — Encounter: Payer: Self-pay | Admitting: Family Medicine

## 2023-10-07 ENCOUNTER — Ambulatory Visit: Payer: Self-pay | Admitting: Family Medicine

## 2023-10-07 ENCOUNTER — Other Ambulatory Visit: Payer: Self-pay | Admitting: Family Medicine

## 2023-10-07 ENCOUNTER — Ambulatory Visit (INDEPENDENT_AMBULATORY_CARE_PROVIDER_SITE_OTHER): Payer: 59 | Admitting: Family Medicine

## 2023-10-07 VITALS — BP 129/66 | HR 80 | Temp 97.8°F | Ht 68.0 in | Wt 193.0 lb

## 2023-10-07 DIAGNOSIS — K219 Gastro-esophageal reflux disease without esophagitis: Secondary | ICD-10-CM

## 2023-10-07 DIAGNOSIS — J988 Other specified respiratory disorders: Secondary | ICD-10-CM

## 2023-10-07 MED ORDER — BENZONATATE 200 MG PO CAPS: 200.0000 mg | ORAL_CAPSULE | Freq: Two times a day (BID) | ORAL | 0 refills | Status: DC | PRN

## 2023-10-07 MED ORDER — PREDNISONE 20 MG PO TABS: 40.0000 mg | ORAL_TABLET | Freq: Every day | ORAL | 0 refills | Status: AC

## 2023-10-07 MED ORDER — AMOXICILLIN-POT CLAVULANATE 875-125 MG PO TABS: 1.0000 | ORAL_TABLET | Freq: Two times a day (BID) | ORAL | 0 refills | Status: DC

## 2023-10-07 MED ORDER — HYDROCOD POLI-CHLORPHE POLI ER 10-8 MG/5ML PO SUER
5.0000 mL | Freq: Two times a day (BID) | ORAL | 0 refills | Status: AC | PRN
Start: 2023-10-07 — End: 2023-10-12

## 2023-10-07 NOTE — Patient Instructions (Signed)
The following information is provided as a Counsellor for ADULT patients only and does NOT take into account PREGNANCY, ALLERGIES, LIVER CONDITIONS, KIDNEY CONDITIONS, GASTROINTESTINAL CONDITIONS, OR PRESCRIPTION MEDICATION INTERACTIONS. Please be sure to ask your provider if the following are safe to take with your specific medical history, conditions, or current medication regimen if you are unsure.   Adult Basic Symptom Management  Congestion: Guaifenesin (Mucinex)- follow directions on packaging with a maximum dose of 2400mg  in a 24 hour period.  Pain/Fever: Ibuprofen 200mg  - 400mg  every 4-6 hours as needed (MAX 1200mg  in a 24 hour period) Pain/Fever: Tylenol 500mg  -1000mg  every 6-8 hours as needed (MAX 3000mg  in a 24 hour period)  Cough: Dextromethorphan (Delsym)- follow directions on packing with a maximum dose of 120mg  in a 24 hour period.  Nasal Stuffiness: Saline nasal spray and/or Nettie Pot with sterile saline solution  Runny Nose: Fluticasone nasal spray (Flonase) OR Mometasone nasal spray (Nasonex) OR Triamcinolone Acetonide nasal spray (Nasacort)- follow directions on the packaging  Pain/Pressure: Warm washcloth to the face  Sore Throat: Warm salt water gargles  If you have allergies, you may also consider taking an oral antihistamine (like Zyrtec or Claritin) as these may also help with your symptoms.  **Many medications will have more than one ingredient, be sure you are reading the packaging carefully and not taking more than one dose of the same kind of medication at the same time or too close together. It is OK to use formulas that have all of the ingredients you want, but do not take them in a combined medication and as separate dose too close together. If you have any questions, the pharmacist will be happy to help you decide what is safe.

## 2023-10-07 NOTE — Progress Notes (Signed)
Acute Office Visit  Subjective:     Patient ID: Jose [name], male    DOB: 06-11-1971, 53 y.o.   MRN: [id number]  Chief Complaint  Patient presents with   Cough     Patient is in today for cough.   Discussed the use of AI scribe software for clinical note transcription with the patient, who gave verbal consent to proceed.  History of Present Illness   The patient presented with symptoms that started a week ago, following attendance at a wedding. The initial symptoms were a sore throat and nasal congestion, which later progressed to a cough. Despite self-medication with over-the-counter remedies such as Mucinex, cough drops, and hot liquids with honey, ginger, and lemon, the cough persisted and became more severe. The patient reported a deep, forceful cough that caused soreness in the rib cage. Notably, the patient observed blood in their sputum on waking for the past two days, which they attributed to possible throat irritation from the forceful cough. The patient denied experiencing any wheezing but reported difficulty in clearing mucus, describing it as dry and hard to expel. They denied any significant shortness of breath.  Earlier in the week, the patient experienced body aches and fatigue, which they attributed to stress and extensive preparations for a recent event. The patient also reported that two of their children had similar symptoms, with one diagnosed with bronchitis and flu, and the other with a common cold.             All review of systems negative except what is listed in the HPI      Objective:    BP 129/66   Pulse 80   Temp 97.8 F (36.6 C) (Oral)   Ht 5\' 8"  (1.727 m)   Wt 193 lb (87.5 kg)   SpO2 100%   BMI 29.35 kg/m    Physical Exam Vitals reviewed.  Constitutional:      General: He is not in acute distress.    Appearance: Normal appearance.  HENT:     Head: Normocephalic and atraumatic.  Cardiovascular:     Rate and Rhythm: Normal  rate and regular rhythm.  Pulmonary:     Effort: Pulmonary effort is normal.     Breath sounds: Wheezing and rhonchi present.  Skin:    General: Skin is warm and dry.  Neurological:     Mental Status: He is alert and oriented to person, place, and time.  Psychiatric:        Mood and Affect: Mood normal.        Behavior: Behavior normal.        Thought Content: Thought content normal.        Judgment: Judgment normal.    No results found for any visits on 10/07/23.      Assessment & Plan:   Problem List Items Addressed This Visit   None Visit Diagnoses       Respiratory infection    -  Primary   Relevant Medications   amoxicillin-clavulanate (AUGMENTIN) 875-125 MG tablet   predniSONE (DELTASONE) 20 MG tablet   benzonatate (TESSALON) 200 MG capsule   chlorpheniramine-HYDROcodone (TUSSIONEX) 10-8 MG/5ML      Persistent cough for over a week with some blood in sputum in the mornings. No fever, wheezing, or shortness of breath. Recent exposure to large crowds and family members with similar symptoms. -Start Augmentin, prednisone, and cough pearls for daytime use, cough syrup for nighttime use. -Continue Mucinex for chest congestion  as needed. -If symptoms worsen, develop high fever, or see recurrence of blood in sputum, obtain a chest x-ray.     Patient aware of signs/symptoms requiring further/urgent evaluation.    Meds ordered this encounter  Medications   amoxicillin-clavulanate (AUGMENTIN) 875-125 MG tablet    Sig: Take 1 tablet by mouth 2 (two) times daily.    Dispense:  20 tablet    Refill:  0    Supervising Provider:   Danise Edge A [[id number]]   predniSONE (DELTASONE) 20 MG tablet    Sig: Take 2 tablets (40 mg total) by mouth daily with breakfast for 5 days.    Dispense:  10 tablet    Refill:  0    Supervising Provider:   Danise Edge A [[id number]]   benzonatate (TESSALON) 200 MG capsule    Sig: Take 1 capsule (200 mg total) by mouth 2 (two) times daily as needed  for cough.    Dispense:  20 capsule    Refill:  0    Supervising Provider:   Danise Edge A [[id number]]   chlorpheniramine-HYDROcodone (TUSSIONEX) 10-8 MG/5ML    Sig: Take 5 mLs by mouth every 12 (twelve) hours as needed for up to 5 days.    Dispense:  50 mL    Refill:  0    Supervising Provider:   Danise Edge A [[id number]]    Return if symptoms worsen or fail to improve.  Clayborne Dana, NP

## 2023-10-07 NOTE — Telephone Encounter (Signed)
  Chief Complaint: cough, congestion Symptoms: productive cough, 2 episodes of blood in sputum, congestion Frequency: 4 days Pertinent Negatives: Patient denies SOB, CP, fever Disposition: [] ED /[] Urgent Care (no appt availability in office) / [x] Appointment(In office/virtual)/ []  [facility]/ [] Home Care/ [] Refused Recommended Disposition /[]  Mobile Bus/ []  Follow-up with PCP Additional Notes: Patient calls stating he has had a mostly dry cough that is occasionally productive with yellow sputum and congestion x 4 days. Patient reports that yesterday in the morning and today he coughed up blood first thing in the morning. Reports approx 1 teaspoon each time- patient is on blood thinner. Per protocol, patient to be evaluated within 4 hours. Next available with PCP 1/27- scheduled patient for today at 1100 with alternate provider in office. Care advice reviewed, patient verbalized understanding. Alerting PCP for review.   Copied from CRM [id number] [id number]. Topic: Clinical - Red Word Triage >> Oct 07, 2023  8:43 AM Steele Sizer wrote: Red Word that prompted transfer to Nurse Triage: Pt stated that he feels like he has a common cold, coughing blood, and not feeling well. Reason for Disposition  [1] Coughed up blood AND [2] > 1 tablespoon (15 ml)   (Exception: Blood-tinged sputum.)  Answer Assessment - Initial Assessment Questions 1. ONSET: "When did the cough begin?"      Approx 4 days 2. SEVERITY: "How bad is the cough today?"      Mild 3. SPUTUM: "Describe the color of your sputum" (none, dry cough; clear, white, yellow, green)     Yellow 4. HEMOPTYSIS: "Are you coughing up any blood?" If so ask: "How much?" (flecks, streaks, tablespoons, etc.)     In the morning yesterday and today- approx size of teaspoon- bright red. 5. DIFFICULTY BREATHING: "Are you having difficulty breathing?" If Yes, ask: "How bad is it?" (e.g., mild, moderate, severe)    - MILD: No SOB at rest, mild SOB with  walking, speaks normally in sentences, can lie down, no retractions, pulse < 100.    - MODERATE: SOB at rest, SOB with minimal exertion and prefers to sit, cannot lie down flat, speaks in phrases, mild retractions, audible wheezing, pulse 100-120.    - SEVERE: Very SOB at rest, speaks in single words, struggling to breathe, sitting hunched forward, retractions, pulse > 120      Denies 6. FEVER: "Do you have a fever?" If Yes, ask: "What is your temperature, how was it measured, and when did it start?"     Denies 7. CARDIAC HISTORY: "Do you have any history of heart disease?" (e.g., heart attack, congestive heart failure)      Hight cholesterol 8. LUNG HISTORY: "Do you have any history of lung disease?"  (e.g., pulmonary embolus, asthma, emphysema)     Denies 9. PE RISK FACTORS: "Do you have a history of blood clots?" (or: recent major surgery, recent prolonged travel, bedridden)     Yes, approx 5-6 years, on blood thinner 10. OTHER SYMPTOMS: "Do you have any other symptoms?" (e.g., runny nose, wheezing, chest pain)       Congestion 12. TRAVEL: "Have you traveled out of the country in the last month?" (e.g., travel history, exposures)       Denies  Protocols used: Cough - Acute Productive-A-AH

## 2023-10-09 ENCOUNTER — Other Ambulatory Visit: Payer: Self-pay | Admitting: Internal Medicine

## 2023-10-12 ENCOUNTER — Encounter (HOSPITAL_BASED_OUTPATIENT_CLINIC_OR_DEPARTMENT_OTHER): Payer: Self-pay | Admitting: Emergency Medicine

## 2023-10-12 ENCOUNTER — Other Ambulatory Visit: Payer: Self-pay

## 2023-10-12 ENCOUNTER — Other Ambulatory Visit: Payer: Self-pay | Admitting: Internal Medicine

## 2023-10-12 ENCOUNTER — Telehealth: Payer: Self-pay | Admitting: Internal Medicine

## 2023-10-12 ENCOUNTER — Emergency Department (HOSPITAL_BASED_OUTPATIENT_CLINIC_OR_DEPARTMENT_OTHER): Payer: 59

## 2023-10-12 ENCOUNTER — Emergency Department (HOSPITAL_BASED_OUTPATIENT_CLINIC_OR_DEPARTMENT_OTHER)
Admission: EM | Admit: 2023-10-12 | Discharge: 2023-10-12 | Disposition: A | Discharge status: Home / Self Care | Payer: 59 | Attending: Emergency Medicine | Admitting: Emergency Medicine

## 2023-10-12 DIAGNOSIS — M940 Chondrocostal junction syndrome [Tietze]: Secondary | ICD-10-CM | POA: Diagnosis not present

## 2023-10-12 DIAGNOSIS — M546 Pain in thoracic spine: Secondary | ICD-10-CM | POA: Diagnosis not present

## 2023-10-12 DIAGNOSIS — Z7901 Long term (current) use of anticoagulants: Secondary | ICD-10-CM | POA: Diagnosis not present

## 2023-10-12 DIAGNOSIS — J40 Bronchitis, not specified as acute or chronic: Secondary | ICD-10-CM | POA: Insufficient documentation

## 2023-10-12 DIAGNOSIS — E785 Hyperlipidemia, unspecified: Secondary | ICD-10-CM

## 2023-10-12 DIAGNOSIS — R931 Abnormal findings on diagnostic imaging of heart and coronary circulation: Secondary | ICD-10-CM

## 2023-10-12 DIAGNOSIS — E782 Mixed hyperlipidemia: Secondary | ICD-10-CM

## 2023-10-12 DIAGNOSIS — R0602 Shortness of breath: Secondary | ICD-10-CM | POA: Diagnosis present

## 2023-10-12 HISTORY — DX: Other pulmonary embolism without acute cor pulmonale: I26.99

## 2023-10-12 LAB — BASIC METABOLIC PANEL
Anion gap: 8 (ref 5–15)
BUN: 19 mg/dL (ref 6–20)
CO2: 26 mmol/L (ref 22–32)
Calcium: 9.4 mg/dL (ref 8.9–10.3)
Chloride: 103 mmol/L (ref 98–111)
Creatinine, Ser: 0.93 mg/dL (ref 0.61–1.24)
GFR, Estimated: >60 mL/min (ref >60)
Glucose, Bld: 109 mg/dL — ABNORMAL HIGH (ref 70–99)
Potassium: 3.8 mmol/L (ref 3.5–5.1)
Sodium: 137 mmol/L (ref 135–145)

## 2023-10-12 LAB — URINALYSIS, ROUTINE W REFLEX MICROSCOPIC
Bilirubin Urine: NEGATIVE
Glucose, UA: NEGATIVE mg/dL
Hgb urine dipstick: NEGATIVE
Ketones, ur: NEGATIVE mg/dL
Leukocytes,Ua: NEGATIVE
Nitrite: NEGATIVE
Protein, ur: NEGATIVE mg/dL
Specific Gravity, Urine: 1.02 (ref 1.005–1.030)
pH: 6 (ref 5.0–8.0)

## 2023-10-12 LAB — D-DIMER, QUANTITATIVE: D-Dimer, Quant: <0.27 ug{FEU}/mL (ref 0.00–0.50)

## 2023-10-12 LAB — CBC
HCT: 40.1 % (ref 39.0–52.0)
Hemoglobin: 13.6 g/dL (ref 13.0–17.0)
MCH: 28.8 pg (ref 26.0–34.0)
MCHC: 33.9 g/dL (ref 30.0–36.0)
MCV: 84.8 fL (ref 80.0–100.0)
Platelets: 281 10*3/uL (ref 150–400)
RBC: 4.73 MIL/uL (ref 4.22–5.81)
RDW: 12.7 % (ref 11.5–15.5)
WBC: 10.3 10*3/uL (ref 4.0–10.5)
nRBC: 0 % (ref 0.0–0.2)

## 2023-10-12 MED ORDER — SODIUM CHLORIDE 0.9 % IV BOLUS
1000.0000 mL | Freq: Once | INTRAVENOUS | Status: AC
  Administered 2023-10-12: 1000 mL via INTRAVENOUS

## 2023-10-12 MED ORDER — GUAIFENESIN-DM 100-10 MG/5ML PO SYRP: 10.0000 mL | ORAL_SOLUTION | Freq: Four times a day (QID) | ORAL | 0 refills | Status: DC | PRN

## 2023-10-12 MED ORDER — KETOROLAC TROMETHAMINE 15 MG/ML IJ SOLN
15.0000 mg | Freq: Once | INTRAMUSCULAR | Status: AC
  Administered 2023-10-12: 15 mg via INTRAVENOUS
  Filled 2023-10-12: qty 1

## 2023-10-12 MED ORDER — GUAIFENESIN-CODEINE 100-10 MG/5ML PO SOLN
10.0000 mL | Freq: Once | ORAL | Status: AC
  Administered 2023-10-12: 10 mL via ORAL
  Filled 2023-10-12: qty 10

## 2023-10-12 NOTE — ED Notes (Signed)
Pt unable to void at this time. States he went recently.

## 2023-10-12 NOTE — Telephone Encounter (Signed)
*  STAT* If patient is at the pharmacy, call can be transferred to refill team.   1. Which medications need to be refilled? (please list name of each medication and dose if known) Bempedoic Acid (NEXLETOL) 180 MG TABS  Take 1 tablet (180 mg total) by mouth daily.      2. Would you like to learn more about the convenience, safety, & potential cost savings by using the [facility]? No    3. Are you open to using the [facility] No   4. Which pharmacy/location (including street and city if local pharmacy) is medication to be sent to? [id number] - [place], [place] - [address]   5. Do they need a 30 day or 90 day supply? 90 Day Supply  Pt is scheduled to see Dr. Rennis Golden on 01/26/24.

## 2023-10-12 NOTE — Telephone Encounter (Signed)
Told of lab work ordered. Will mail lab slip to patient so he can get lab work done before his appointment. He verbalized understanding.

## 2023-10-12 NOTE — ED Notes (Signed)
Pt states he has been treated with antibiotics and cough meds 5 days ago C/o back pain rt sided radiating to the left.  States it is r/t coughing

## 2023-10-12 NOTE — ED Triage Notes (Signed)
PtPOV steady gait- c/o flu like sx x 8-9 day, given antibiotic, cough syrup by PCP 5 days ago.  C/o R posterior flank pain, now radiating to L. Pain worse with coughing.   Hx of PE. Currently taking xarelto.

## 2023-10-12 NOTE — Telephone Encounter (Signed)
Refill already sent in.

## 2023-10-12 NOTE — Discharge Instructions (Signed)
Your back pain is likely secondary to costochondritis which is rib pain.  This can happen after prolonged coughing.  Please take Motrin 600 mg every 6-8 hours as needed for pain and use a heating pad.  Use things for cough suppression including tea with honey, throat lozenges, and cough syrup.

## 2023-10-12 NOTE — ED Provider Notes (Signed)
[facility] Provider Note   CSN: [id number] Arrival date & time: 10/12/23  1654     History  Chief Complaint  Patient presents with   Flank Pain   Shortness of Breath    Jose [name] is a 53 y.o. male.  Patient is a 53 year old male presenting for back pain.  Patient states he recently had an upper respiratory infection/flulike symptoms approximately 8 to 9 days ago.  He states all of his symptoms have resolved besides a lingering cough.  His PCP currently has him on amoxicillin and steroids.  Patient is actively coughing in the room during evaluation.  He denies productive sputum.  Denies fevers or chills.  Demonstrates pain in the posterior lower thorax region.  Presents with concerns for possible full pulmonary embolism due to previous history of PE.  Currently on Xarelto.  Denies urinary symptoms, hematuria, or history of kidney stones.  The history is provided by the patient. No language interpreter was used.  Flank Pain Associated symptoms include shortness of breath. Pertinent negatives include no chest pain and no abdominal pain.  Shortness of Breath Associated symptoms: no abdominal pain, no chest pain, no cough, no ear pain, no fever, no rash, no sore throat and no vomiting        Home Medications Prior to Admission medications   Medication Sig Start Date End Date Taking? Authorizing Provider  guaiFENesin-dextromethorphan (ROBITUSSIN DM) 100-10 MG/5ML syrup Take 10 mLs by mouth every 6 (six) hours as needed for cough. 10/12/23  Yes Edwin Dada P, DO  amoxicillin-clavulanate (AUGMENTIN) 875-125 MG tablet Take 1 tablet by mouth 2 (two) times daily. 10/07/23   Clayborne Dana, NP  Bempedoic Acid (NEXLETOL) 180 MG TABS Take 1 tablet (180 mg total) by mouth daily. Please call [phone] to schedule an appointment with Dr. Zoila Shutter for future refills. Thank you. 10/12/23   Chrystie Nose, MD  benzonatate (TESSALON) 200  MG capsule Take 1 capsule (200 mg total) by mouth 2 (two) times daily as needed for cough. 10/07/23   Clayborne Dana, NP  cetirizine (ZYRTEC) 10 MG tablet Take 1 tablet (10 mg total) by mouth daily. 07/14/23   Sharlene Dory, DO  chlorpheniramine-HYDROcodone (TUSSIONEX) 10-8 MG/5ML Take 5 mLs by mouth every 12 (twelve) hours as needed for up to 5 days. 10/07/23 10/12/23  Clayborne Dana, NP  clobetasol cream (TEMOVATE) 0.05 % Apply twice daily during psoriasis flares. 07/14/23   Wendling, Jilda Roche, DO  inFLIXimab-axxq (AVSOLA) 100 MG injection Inject 100 mg into the vein every 8 (eight) weeks.    [provider]  Multiple Vitamin (MULTIVITAMIN) capsule Take 1 capsule by mouth daily.    [provider]  mupirocin ointment (BACTROBAN) 2 % Apply twice daily to excoriated regions as needed. 07/14/23   Sharlene Dory, DO  omeprazole (PRILOSEC) 40 MG capsule TAKE 1 CAPSULE (40 MG TOTAL) BY MOUTH DAILY. 10/07/23   Sharlene Dory, DO  predniSONE (DELTASONE) 20 MG tablet Take 2 tablets (40 mg total) by mouth daily with breakfast for 5 days. 10/07/23 10/12/23  Clayborne Dana, NP  rivaroxaban (XARELTO) 20 MG TABS tablet TAKE 1 TABLET (20 MG TOTAL) DAILY WITH SUPPER BY MOUTH 01/09/23   Wendling, Jilda Roche, DO      Allergies    Statins and Repatha [evolocumab]    Review of Systems   Review of Systems  Constitutional:  Negative for chills and fever.  HENT:  Negative for ear pain and sore throat.   Eyes:  Negative for pain and visual disturbance.  Respiratory:  Positive for shortness of breath. Negative for cough.   Cardiovascular:  Negative for chest pain and palpitations.  Gastrointestinal:  Negative for abdominal pain and vomiting.  Genitourinary:  Negative for dysuria, flank pain and hematuria.  Musculoskeletal:  Positive for back pain. Negative for arthralgias.  Skin:  Negative for color change and rash.  Neurological:  Negative for seizures and syncope.   All other systems reviewed and are negative.   Physical Exam Updated Vital Signs BP 138/80   Pulse 66   Temp 98.4 F (36.9 C) (Oral)   Resp 18   Ht 5\' 8"  (1.727 m)   Wt 86.2 kg   SpO2 99%   BMI 28.89 kg/m  Physical Exam Vitals and nursing note reviewed.  Constitutional:      General: He is not in acute distress.    Appearance: He is well-developed.  HENT:     Head: Normocephalic and atraumatic.  Eyes:     Conjunctiva/sclera: Conjunctivae normal.  Cardiovascular:     Rate and Rhythm: Normal rate and regular rhythm.     Heart sounds: No murmur heard. Pulmonary:     Effort: Pulmonary effort is normal. No respiratory distress.     Breath sounds: Normal breath sounds.  Abdominal:     Palpations: Abdomen is soft.     Tenderness: There is no abdominal tenderness.  Musculoskeletal:        General: No swelling.     Cervical back: Neck supple. No bony tenderness.     Thoracic back: Tenderness present. No bony tenderness.     Lumbar back: No bony tenderness.       Back:  Skin:    General: Skin is warm and dry.     Capillary Refill: Capillary refill takes less than 2 seconds.  Neurological:     Mental Status: He is alert.  Psychiatric:        Mood and Affect: Mood normal.     ED Results / Procedures / Treatments   Labs (all labs ordered are listed, but only abnormal results are displayed) Labs Reviewed  BASIC METABOLIC PANEL - Abnormal; Notable for the following components:      Result Value   Glucose, Bld 109 (*)    All other components within normal limits  D-DIMER, QUANTITATIVE  URINALYSIS, ROUTINE W REFLEX MICROSCOPIC  CBC    EKG None  Radiology DG Chest 2 View Result Date: 10/12/2023 CLINICAL DATA:  Shortness of breath EXAM: CHEST - 2 VIEW COMPARISON:  07/18/2017 FINDINGS: The heart size and mediastinal contours are within normal limits. Both lungs are clear. The visualized skeletal structures are unremarkable. IMPRESSION: No active cardiopulmonary  disease. Electronically Signed   By: Minerva Fester M.D.   On: 10/12/2023 18:13    Procedures Procedures    Medications Ordered in ED Medications  sodium chloride 0.9 % bolus 1,000 mL (0 mLs Intravenous Stopped 10/12/23 2137)  ketorolac (TORADOL) 15 MG/ML injection 15 mg (15 mg Intravenous Given 10/12/23 2013)  guaiFENesin-codeine 100-10 MG/5ML solution 10 mL (10 mLs Oral Given 10/12/23 2023)    ED Course/ Medical Decision Making/ A&P                                 Medical Decision Making Amount and/or Complexity of Data Reviewed Labs: ordered. Radiology: ordered.  Risk  OTC drugs. Prescription drug management.   56:51 PM  53 year old male presenting for posterior rib/flank pain.  Patient is alert and oriented x 3, no acute distress, afebrile, stable vital signs.  On exam he has reproducible tenderness to palpation of his posterior ribs bilaterally 7 through 10 and with worsening pain on the right side.  He is equal bilateral breath sounds.  No rashes or skin color changes.  Although his symptoms do point towards bronchitis with costochondritis secondary to coughing he does present with concerns for pulmonary embolism due to his previous history.  His D-dimer today is negative which makes pulmonary embolism less likely.  His chest x-ray is clear without focal pneumonias.  His urine also demonstrates no urinary tract infection or hematuria so low suspicion ureterolithiasis or pyelonephritis given his pain being near his flank region.  Recommend rest, Motrin, and a heating pad.  Cough suppression is already on board.  Patient in no distress and overall condition improved here in the ED. Detailed discussions were had with the patient regarding current findings, and need for close f/u with PCP or on call doctor. The patient has been instructed to return immediately if the symptoms worsen in any way for re-evaluation. Patient verbalized understanding and is in agreement with current care  plan. All questions answered prior to discharge.         Final Clinical Impression(s) / ED Diagnoses Final diagnoses:  Bronchitis  Costochondritis    Rx / DC Orders ED Discharge Orders          Ordered    guaiFENesin-dextromethorphan (ROBITUSSIN DM) 100-10 MG/5ML syrup  Every 6 hours PRN        10/12/23 2207              Franne Forts, DO 10/12/23 2209

## 2023-10-12 NOTE — Telephone Encounter (Signed)
Patient is calling to know if he can get the lab orders on paper to take the lab. Please advise.

## 2023-10-12 NOTE — Telephone Encounter (Signed)
Has appt scheduled with Dr Rennis Golden 01/26/24-  want to get labs before the appt.   Informed him that I will send this information to his provider and we will be back in touch about lab work needed and lab slips. He verbalized understanding.

## 2023-10-13 ENCOUNTER — Ambulatory Visit: Payer: 59 | Admitting: Family Medicine

## 2023-10-13 ENCOUNTER — Other Ambulatory Visit (HOSPITAL_COMMUNITY): Payer: Self-pay

## 2023-10-13 ENCOUNTER — Telehealth: Payer: Self-pay | Admitting: Pharmacy Technician

## 2023-10-13 NOTE — Telephone Encounter (Signed)
Pharmacy Patient Advocate Encounter   Received notification from  rx request  that prior authorization for NEXLETOL 180 MG TABLET is required/requested.   Insurance verification completed.   The patient is insured through [facility] .   Per test claim: PA required; PA started via CoverMyMeds. KEY [id number] . Please see clinical question(s) below that I am not finding the answer to in her chart and advise.   Does the patient have documentation of ONE of the following LDL-C values while on a maximally tolerated lipid lowering therapy for a minimum of at least 12 weeks within the last 120 days: (1) LDL-C greater than or equal to 100 mg/dL with ASCVD, (2) LDL-C greater than or equal to 130 mg/dL without ASCVD?   Sent message to [employer]

## 2023-10-16 NOTE — Telephone Encounter (Signed)
MyChart message sent to patient about fasting labs needed for PA

## 2023-11-03 ENCOUNTER — Other Ambulatory Visit: Payer: Self-pay | Admitting: Internal Medicine

## 2023-11-03 ENCOUNTER — Other Ambulatory Visit: Payer: Self-pay

## 2023-11-03 ENCOUNTER — Encounter (HOSPITAL_BASED_OUTPATIENT_CLINIC_OR_DEPARTMENT_OTHER): Payer: Self-pay | Admitting: Internal Medicine

## 2023-11-03 MED ORDER — NEXLETOL 180 MG PO TABS: 1.0000 | ORAL_TABLET | Freq: Every day | ORAL | 0 refills | Status: DC

## 2023-11-03 NOTE — Telephone Encounter (Signed)
Refill sent to get pt to his appointment with Dr. Rennis Golden. Notation on medication bottle to keep appointment.

## 2023-12-05 LAB — NMR, LIPOPROFILE
Cholesterol, Total: 192 mg/dL (ref 100–199)
HDL Particle Number: 19.1 umol/L — ABNORMAL LOW (ref >=30.5)
HDL-C: 31 mg/dL — ABNORMAL LOW (ref >39)
LDL Particle Number: 1616 nmol/L — ABNORMAL HIGH (ref <1000)
LDL Size: 20.5 nm — ABNORMAL LOW (ref >20.5)
LDL-C (NIH Calc): 135 mg/dL — ABNORMAL HIGH (ref 0–99)
LP-IR Score: 56 — ABNORMAL HIGH (ref <=45)
Small LDL Particle Number: 957 nmol/L — ABNORMAL HIGH (ref <=527)
Triglycerides: 141 mg/dL (ref 0–149)

## 2023-12-08 ENCOUNTER — Other Ambulatory Visit: Payer: Self-pay | Admitting: Internal Medicine

## 2023-12-08 DIAGNOSIS — E7841 Elevated Lipoprotein(a): Secondary | ICD-10-CM

## 2023-12-08 DIAGNOSIS — T466X5A Adverse effect of antihyperlipidemic and antiarteriosclerotic drugs, initial encounter: Secondary | ICD-10-CM

## 2023-12-08 DIAGNOSIS — E785 Hyperlipidemia, unspecified: Secondary | ICD-10-CM

## 2023-12-08 DIAGNOSIS — E782 Mixed hyperlipidemia: Secondary | ICD-10-CM

## 2023-12-08 DIAGNOSIS — R931 Abnormal findings on diagnostic imaging of heart and coronary circulation: Secondary | ICD-10-CM

## 2023-12-08 MED ORDER — NEXLETOL 180 MG PO TABS: 1.0000 | ORAL_TABLET | Freq: Every day | ORAL | 0 refills | Status: DC

## 2024-01-07 ENCOUNTER — Other Ambulatory Visit: Payer: Self-pay | Admitting: Family Medicine

## 2024-01-07 DIAGNOSIS — Z86711 Personal history of pulmonary embolism: Secondary | ICD-10-CM

## 2024-01-24 ENCOUNTER — Other Ambulatory Visit (HOSPITAL_BASED_OUTPATIENT_CLINIC_OR_DEPARTMENT_OTHER): Payer: Self-pay | Admitting: Internal Medicine

## 2024-01-24 DIAGNOSIS — R931 Abnormal findings on diagnostic imaging of heart and coronary circulation: Secondary | ICD-10-CM

## 2024-01-24 DIAGNOSIS — E782 Mixed hyperlipidemia: Secondary | ICD-10-CM

## 2024-01-24 DIAGNOSIS — E7841 Elevated Lipoprotein(a): Secondary | ICD-10-CM

## 2024-01-24 DIAGNOSIS — E785 Hyperlipidemia, unspecified: Secondary | ICD-10-CM

## 2024-01-24 DIAGNOSIS — T466X5A Adverse effect of antihyperlipidemic and antiarteriosclerotic drugs, initial encounter: Secondary | ICD-10-CM

## 2024-01-25 ENCOUNTER — Other Ambulatory Visit (HOSPITAL_BASED_OUTPATIENT_CLINIC_OR_DEPARTMENT_OTHER): Payer: Self-pay | Admitting: Internal Medicine

## 2024-01-25 DIAGNOSIS — E782 Mixed hyperlipidemia: Secondary | ICD-10-CM

## 2024-01-25 DIAGNOSIS — E785 Hyperlipidemia, unspecified: Secondary | ICD-10-CM

## 2024-01-25 DIAGNOSIS — E7841 Elevated Lipoprotein(a): Secondary | ICD-10-CM

## 2024-01-25 DIAGNOSIS — R931 Abnormal findings on diagnostic imaging of heart and coronary circulation: Secondary | ICD-10-CM

## 2024-01-25 DIAGNOSIS — M791 Myalgia, unspecified site: Secondary | ICD-10-CM

## 2024-01-25 MED ORDER — NEXLETOL 180 MG PO TABS: 1.0000 | ORAL_TABLET | Freq: Every day | ORAL | 0 refills | Status: DC

## 2024-01-25 NOTE — Telephone Encounter (Signed)
 Routing to correct pool.

## 2024-01-26 ENCOUNTER — Telehealth: Payer: Self-pay | Admitting: Pharmacy Technician

## 2024-01-26 ENCOUNTER — Other Ambulatory Visit (HOSPITAL_BASED_OUTPATIENT_CLINIC_OR_DEPARTMENT_OTHER): Payer: Self-pay | Admitting: Internal Medicine

## 2024-01-26 ENCOUNTER — Encounter (HOSPITAL_BASED_OUTPATIENT_CLINIC_OR_DEPARTMENT_OTHER): Payer: Self-pay | Admitting: Internal Medicine

## 2024-01-26 ENCOUNTER — Other Ambulatory Visit (HOSPITAL_COMMUNITY): Payer: Self-pay

## 2024-01-26 ENCOUNTER — Ambulatory Visit (INDEPENDENT_AMBULATORY_CARE_PROVIDER_SITE_OTHER): Payer: 59 | Admitting: Internal Medicine

## 2024-01-26 VITALS — BP 124/76 | HR 77 | Ht 68.0 in | Wt 190.8 lb

## 2024-01-26 DIAGNOSIS — E7841 Elevated Lipoprotein(a): Secondary | ICD-10-CM

## 2024-01-26 DIAGNOSIS — E785 Hyperlipidemia, unspecified: Secondary | ICD-10-CM | POA: Diagnosis not present

## 2024-01-26 DIAGNOSIS — T466X5A Adverse effect of antihyperlipidemic and antiarteriosclerotic drugs, initial encounter: Secondary | ICD-10-CM

## 2024-01-26 DIAGNOSIS — M791 Myalgia, unspecified site: Secondary | ICD-10-CM | POA: Diagnosis not present

## 2024-01-26 DIAGNOSIS — Z789 Other specified health status: Secondary | ICD-10-CM

## 2024-01-26 DIAGNOSIS — T466X5D Adverse effect of antihyperlipidemic and antiarteriosclerotic drugs, subsequent encounter: Secondary | ICD-10-CM

## 2024-01-26 DIAGNOSIS — R931 Abnormal findings on diagnostic imaging of heart and coronary circulation: Secondary | ICD-10-CM

## 2024-01-26 MED ORDER — NEXLIZET 180-10 MG PO TABS: 1.0000 | ORAL_TABLET | Freq: Every day | ORAL | Status: AC

## 2024-01-26 MED ORDER — NEXLIZET 180-10 MG PO TABS: 1.0000 | ORAL_TABLET | Freq: Every day | ORAL | 3 refills | Status: AC

## 2024-01-26 NOTE — Patient Instructions (Addendum)
 Medication Instructions:  STOP NEXLETOL   START NEXLIZET 180 mg/10 mg daily  *If you need a refill on your cardiac medications before your next appointment, please call your pharmacy*  Lab Work: FASTING lipids- NMR in 3-4 months If you have labs (blood work) drawn today and your tests are completely normal, you will receive your results only by: MyChart Message (if you have MyChart) OR A paper copy in the mail If you have any lab test that is abnormal or we need to change your treatment, we will call you to review the results.  Follow-Up: At [facility], you and your health needs are our priority.  As part of our continuing mission to provide you with exceptional heart care, our providers are all part of one team.  This team includes your primary Cardiologist (physician) and Advanced Practice Providers or APPs (Physician Assistants and Nurse Practitioners) who all work together to provide you with the care you need, when you need it.  Your next appointment:   With Dr. Maximo Spar after labwork in Aug-Sept

## 2024-01-26 NOTE — Telephone Encounter (Signed)
 Pharmacy Patient Advocate Encounter   Received notification from RX Request Messages that prior authorization for Nexlizet is required/requested.   Insurance verification completed.   The patient is insured through [employer] .   Per test claim: PA required; PA submitted to above mentioned insurance via CoverMyMeds Key/confirmation #/EOC [id number] Status is pending

## 2024-01-26 NOTE — Progress Notes (Signed)
 LIPID CLINIC CONSULT NOTE  Chief Complaint:  Follow-up dyslipidemia  Primary Care Physician: Jobe Mulder, DO  Primary Cardiologist:  Dorothye Gathers, MD  HPI:  Jose [name] is a 53 y.o. male who is being seen today for the evaluation of dyslipidemia at the request of Jobe Mulder*.  He is a patient of Dr. Renna Cary with a history of DVT and PE in the past, Crohn's disease on Remicade  and dyslipidemia.  There is a significant family history of heart disease in his parents and a brother.  Recently a lipid NMR showed elevated particle numbers and LDL C of a 122.  APO B levels were also elevated.  He had an LPA which was elevated at 112.  He did not tolerate statins due to significant myalgias.  I therefore recommended PCSK9 inhibitor therapy.  He was very reluctant to start that.  Did obtain a calcium score which was the abnormal with a CAC of 22.  This was 77th percentile for age and sex matched control.  Based on these findings there is more evidence to consider aggressive therapy since he has early onset heart disease.   08/29/2019   Mr. Chrisman returns today for follow-up.  Overall he reports doing well on Nexletol .  Unfortunately his insurance company did not cover the medication.  I believe they argue that he did not have cardiovascular disease however he does have an abnormal coronary calcium score of 22 indicating he does have ASCVD.  In addition he was intolerant to PCSK9 inhibitors and has been intolerant to statin therapy.  It was advised that he take ezetimibe however he has done very well with the Nexletol .  In fact he has almost no side effects.  His total cholesterol is now down to 170 and the LDL has come down to 84.  His triglycerides however did go up, which may be related to some dietary changes he made recently as well as decrease in activity.  Will continue to work on that.   03/23/2020   Mr. Plass was seen today via telephone follow-up.  He  reports continued tolerance of bempedoic acid  with good improvement in his lipids.  His most recent lipid profile shows some particle shifting with very little change in total cholesterol however triglycerides are come down to 163 from 334, small increase in HDL cholesterol from 31-33 and an increase in LDL cholesterol from 84-111.  This likely represents a calculated change, but I suspect his lipid particles have improved.  He has been exercising more although has been eating more meats and his diet is not as optimal as it was 7 months ago.   11/05/2022   Mr. Sheehan is seen today for follow-up.  Overall he says he is feeling well.  I last saw him about 3 years ago.  He has remained on Nexletol .  Unfortunately could not tolerate Repatha  due to side effects.  He could also not tolerate statins.  His lipids have improved although have been generally stable over the past 2 years.  Recent NMR showed an LDL particle number of 1203, LDL-C of 103, HDL-C was 35 and triglycerides were 170.  Small LDL particle number of 780.  Overall he remains still above target with a goal LDL less than 70 and particle numbers ideally less than 800.  I discussed this with him today especially in light of an elevated calcium score which was 77th percentile in 2020.  He said he is committed to trying new diet  and exercise options.  He is also noted to have an elevated LP(a) of around 125 nmol/L, which would not be affected by diet or exercise.  01/26/2024  Mr. Ruis returns for follow-up.  He says he has been out of his Nexletol  for about a month and a half.  He did have recent lipids which shows higher cholesterol including particle number of 1616 and LDL of 135.  Is a known elevated LP(a) of 125.8 nmol/L.  As mentioned previously his calcium score was 22, 77th percentile for age and sex matched controls in 2020.  His target LDL is less than 70.  We again discussed options for lipid-lowering therapy in addition to his current  treatment with Nexletol .  This could include Nexlizet combination therapy or perhaps Leqvio.  He could not tolerate Repatha  or statins.  He denies any shortness of breath or chest pain and remains physically active.  PMHx:  Past Medical History:  Diagnosis Date   Crohn's disease (HCC)    Hyperlipidemia    PE (pulmonary thromboembolism) (HCC)    2019   Thrombophlebitis     No past surgical history on file.  FAMHx:  Family History  Problem Relation Age of Onset   Heart disease Mother     SOCHx:   reports that he has quit smoking. His smoking use included cigarettes. He has never used smokeless tobacco. He reports that he does not drink alcohol and does not use drugs.  ALLERGIES:  Allergies  Allergen Reactions   Statins Other (See Comments)    Chest pain   Repatha  [Evolocumab ] Other (See Comments)    Muscle/Back Pain    ROS: Pertinent items noted in HPI and remainder of comprehensive ROS otherwise negative.  HOME MEDS: Current Outpatient Medications on File Prior to Visit  Medication Sig Dispense Refill   cetirizine  (ZYRTEC ) 10 MG tablet Take 1 tablet (10 mg total) by mouth daily. 90 tablet 3   clobetasol  cream (TEMOVATE ) 0.05 % Apply twice daily during psoriasis flares. 30 g 5   inFLIXimab -axxq (AVSOLA ) 100 MG injection Inject 100 mg into the vein every 8 (eight) weeks.     Multiple Vitamin (MULTIVITAMIN) capsule Take 1 capsule by mouth daily.     mupirocin  ointment (BACTROBAN ) 2 % Apply twice daily to excoriated regions as needed. 30 g 3   omeprazole  (PRILOSEC) 40 MG capsule TAKE 1 CAPSULE (40 MG TOTAL) BY MOUTH DAILY. 90 capsule 3   XARELTO  20 MG TABS tablet TAKE 1 TABLET (20 MG TOTAL) DAILY WITH SUPPER BY MOUTH 90 tablet 3   amoxicillin -clavulanate (AUGMENTIN ) 875-125 MG tablet Take 1 tablet by mouth 2 (two) times daily. (Patient not taking: Reported on 01/26/2024) 20 tablet 0   benzonatate  (TESSALON ) 200 MG capsule Take 1 capsule (200 mg total) by mouth 2 (two) times  daily as needed for cough. (Patient not taking: Reported on 01/26/2024) 20 capsule 0   guaiFENesin -dextromethorphan (ROBITUSSIN DM) 100-10 MG/5ML syrup Take 10 mLs by mouth every 6 (six) hours as needed for cough. (Patient not taking: Reported on 01/26/2024) 118 mL 0   NEXLETOL  180 MG TABS TAKE 1 TABLET BY MOUTH EVERY DAY (Patient not taking: Reported on 01/26/2024) 90 tablet 0   No current facility-administered medications on file prior to visit.    LABS/IMAGING: No results found for this or any previous visit (from the past 48 hours). No results found.  LIPID PANEL:    Component Value Date/Time   CHOL 173 03/16/2020 0936   TRIG 163 (H)  03/16/2020 0936   HDL 33 (L) 03/16/2020 0936   CHOLHDL 5.2 (H) 03/16/2020 0936   CHOLHDL 2.6 03/14/2016 0901   VLDL 20 03/14/2016 0901   LDLCALC 111 (H) 03/16/2020 0936    WEIGHTS: Wt Readings from Last 3 Encounters:  01/26/24 190 lb 12.8 oz (86.5 kg)  10/12/23 190 lb (86.2 kg)  10/07/23 193 lb (87.5 kg)    VITALS: BP 124/76   Pulse 77   Ht 5\' 8"  (1.727 m)   Wt 190 lb 12.8 oz (86.5 kg)   SpO2 97%   BMI 29.01 kg/m   EXAM: Deferred  EKG: Deferred  ASSESSMENT: Dyslipidemia with elevated LP(a) of 125.8 nmol/L Strong family history of premature coronary disease Statin intolerance Elevated CAC score 22 (10/2018) -signs of ASCVD Intolerance to Repatha   PLAN: 1.   Mr. Daddio continues to have cholesterol that is above a target LDL less than 70.  He has a high LP(a) as well.  He cannot tolerate statins or Repatha .  He is on Nexletol .  He has been out of it for about a month and a half and will need a repeat prescription.  I would advise switching to Nexlizet for additional lipid-lowering.  We also discussed the possibility Leqvio but he would like to try this first.  Will provide some samples if they are available today and reach out for prior authorization.  Plan repeat lipid NMR in about 3 to 4 months.  Hazle Lites, MD, [facility],  FNLA, FACP  [place]  [facility]  Medical Director of the Advanced Lipid Disorders &  Cardiovascular Risk Reduction Clinic Diplomate of the American Board of Clinical Lipidology Attending Cardiologist  Direct Dial: [phone]  Fax: [phone]  Website:  [email or web address] [email or web address] 01/26/2024, 9:50 AM

## 2024-01-27 ENCOUNTER — Other Ambulatory Visit (HOSPITAL_COMMUNITY): Payer: Self-pay

## 2024-01-27 NOTE — Telephone Encounter (Signed)
 Pharmacy Patient Advocate Encounter  Received notification from [employer] that Prior Authorization for nexlizet has been APPROVED from 01/26/24 to 01/25/25. Unable to obtain price due to refill too soon rejection, last fill date 01/26/24 next available fill date07/20/25   PA #/Case ID/Reference #: [id number]

## 2024-02-16 ENCOUNTER — Encounter: Payer: Self-pay | Admitting: Internal Medicine

## 2024-02-16 NOTE — Telephone Encounter (Signed)
**Note De-identified  [place] Obfuscation** Please advise 

## 2024-04-26 ENCOUNTER — Encounter: Payer: Self-pay | Admitting: Internal Medicine

## 2024-04-26 ENCOUNTER — Other Ambulatory Visit: Payer: Self-pay

## 2024-04-26 DIAGNOSIS — E785 Hyperlipidemia, unspecified: Secondary | ICD-10-CM

## 2024-04-30 ENCOUNTER — Other Ambulatory Visit: Payer: Self-pay | Admitting: Family Medicine

## 2024-04-30 DIAGNOSIS — L409 Psoriasis, unspecified: Secondary | ICD-10-CM

## 2024-05-19 LAB — NMR, LIPOPROFILE
Cholesterol, Total: 128 mg/dL (ref 100–199)
HDL Particle Number: 23.4 umol/L — ABNORMAL LOW (ref >=30.5)
HDL-C: 28 mg/dL — ABNORMAL LOW (ref >39)
LDL Particle Number: 855 nmol/L (ref <1000)
LDL Size: 20 nm — ABNORMAL LOW (ref >20.5)
LDL-C (NIH Calc): 65 mg/dL (ref 0–99)
LP-IR Score: 71 — ABNORMAL HIGH (ref <=45)
Small LDL Particle Number: 628 nmol/L — ABNORMAL HIGH (ref <=527)
Triglycerides: 209 mg/dL — ABNORMAL HIGH (ref 0–149)

## 2024-05-20 ENCOUNTER — Ambulatory Visit: Payer: Self-pay | Admitting: Internal Medicine

## 2024-05-23 ENCOUNTER — Ambulatory Visit: Admitting: Internal Medicine

## 2024-05-23 ENCOUNTER — Encounter: Admitting: Internal Medicine

## 2024-06-26 ENCOUNTER — Other Ambulatory Visit: Payer: Self-pay | Admitting: Family Medicine

## 2024-06-26 DIAGNOSIS — L409 Psoriasis, unspecified: Secondary | ICD-10-CM

## 2024-06-29 ENCOUNTER — Other Ambulatory Visit: Payer: Self-pay | Admitting: Family Medicine

## 2024-07-12 ENCOUNTER — Encounter: Payer: Self-pay | Admitting: Internal Medicine

## 2024-07-12 ENCOUNTER — Ambulatory Visit: Attending: Internal Medicine | Admitting: Internal Medicine

## 2024-07-12 VITALS — BP 132/80 | HR 82 | Resp 16 | Ht 68.0 in | Wt 200.6 lb

## 2024-07-12 DIAGNOSIS — T466X5D Adverse effect of antihyperlipidemic and antiarteriosclerotic drugs, subsequent encounter: Secondary | ICD-10-CM

## 2024-07-12 DIAGNOSIS — Z789 Other specified health status: Secondary | ICD-10-CM

## 2024-07-12 DIAGNOSIS — E785 Hyperlipidemia, unspecified: Secondary | ICD-10-CM | POA: Diagnosis not present

## 2024-07-12 DIAGNOSIS — E7841 Elevated Lipoprotein(a): Secondary | ICD-10-CM

## 2024-07-12 DIAGNOSIS — R931 Abnormal findings on diagnostic imaging of heart and coronary circulation: Secondary | ICD-10-CM | POA: Diagnosis not present

## 2024-07-12 DIAGNOSIS — Z7901 Long term (current) use of anticoagulants: Secondary | ICD-10-CM

## 2024-07-12 DIAGNOSIS — Z86711 Personal history of pulmonary embolism: Secondary | ICD-10-CM

## 2024-07-12 DIAGNOSIS — T466X5A Adverse effect of antihyperlipidemic and antiarteriosclerotic drugs, initial encounter: Secondary | ICD-10-CM

## 2024-07-12 DIAGNOSIS — M791 Myalgia, unspecified site: Secondary | ICD-10-CM

## 2024-07-12 NOTE — Progress Notes (Signed)
 LIPID CLINIC CONSULT NOTE  Chief Complaint:  Follow-up dyslipidemia  Primary Care Physician: Frann Mabel Mt, DO  Primary Cardiologist:  Vinie JAYSON Maxcy, MD  HPI:  Jose [name] is a 53 y.o. male who is being seen today for the evaluation of dyslipidemia at the request of Frann Mabel Mt*.  He is a patient of Dr. Jeffrie with a history of DVT and PE in the past, Crohn's disease on Remicade  and dyslipidemia.  There is a significant family history of heart disease in his parents and a brother.  Recently a lipid NMR showed elevated particle numbers and LDL C of a 122.  APO B levels were also elevated.  He had an LPA which was elevated at 112.  He did not tolerate statins due to significant myalgias.  I therefore recommended PCSK9 inhibitor therapy.  He was very reluctant to start that.  Did obtain a calcium score which was the abnormal with a CAC of 22.  This was 77th percentile for age and sex matched control.  Based on these findings there is more evidence to consider aggressive therapy since he has early onset heart disease.   08/29/2019   Mr. Jose [name] returns today for follow-up.  Overall he reports doing well on Nexletol .  Unfortunately his insurance company did not cover the medication.  I believe they argue that he did not have cardiovascular disease however he does have an abnormal coronary calcium score of 22 indicating he does have ASCVD.  In addition he was intolerant to PCSK9 inhibitors and has been intolerant to statin therapy.  It was advised that he take ezetimibe however he has done very well with the Nexletol .  In fact he has almost no side effects.  His total cholesterol is now down to 170 and the LDL has come down to 84.  His triglycerides however did go up, which may be related to some dietary changes he made recently as well as decrease in activity.  Will continue to work on that.   03/23/2020   Mr. Jose [name] was seen today via telephone follow-up.  He  reports continued tolerance of bempedoic acid  with good improvement in his lipids.  His most recent lipid profile shows some particle shifting with very little change in total cholesterol however triglycerides are come down to 163 from 334, small increase in HDL cholesterol from 31-33 and an increase in LDL cholesterol from 84-111.  This likely represents a calculated change, but I suspect his lipid particles have improved.  He has been exercising more although has been eating more meats and his diet is not as optimal as it was 7 months ago.   11/05/2022   Mr. Jose [name] is seen today for follow-up.  Overall he says he is feeling well.  I last saw him about 3 years ago.  He has remained on Nexletol .  Unfortunately could not tolerate Repatha  due to side effects.  He could also not tolerate statins.  His lipids have improved although have been generally stable over the past 2 years.  Recent NMR showed an LDL particle number of 1203, LDL-C of 103, HDL-C was 35 and triglycerides were 170.  Small LDL particle number of 780.  Overall he remains still above target with a goal LDL less than 70 and particle numbers ideally less than 800.  I discussed this with him today especially in light of an elevated calcium score which was 77th percentile in 2020.  He said he is committed to trying new diet  and exercise options.  He is also noted to have an elevated LP(a) of around 125 nmol/L, which would not be affected by diet or exercise.  01/26/2024  Mr. Jose [name] returns for follow-up.  He says he has been out of his Nexletol  for about a month and a half.  He did have recent lipids which shows higher cholesterol including particle number of 1616 and LDL of 135.  Is a known elevated LP(a) of 125.8 nmol/L.  As mentioned previously his calcium score was 22, 77th percentile for age and sex matched controls in 2020.  His target LDL is less than 70.  We again discussed options for lipid-lowering therapy in addition to his current  treatment with Nexletol .  This could include Nexlizet  combination therapy or perhaps Leqvio.  He could not tolerate Repatha  or statins.  He denies any shortness of breath or chest pain and remains physically active.  07/12/2024  Mr. Jose [name] returns today for follow-up.  He is done well and Nexlizet .  He had recent repeat lipids which showed marked improvement in his lipids and particle numbers.  His LDL particle numbers down to 855 down from 1616.  LDL 65 (down from 135).  Triglycerides remain elevated at 209.  Small LDL particle numbers improved but still mildly elevated at 628.  He does have a history of high LP(a) at 125.8 nmol/L.  He understands that the medication is on currently should not affect the LP(a) level but he could not tolerate Repatha  in the past and we discussed Leqvio but he preferred the Nexlizet .  Fortunately seems to be tolerating it very well.  He remains asymptomatic denying any chest pain or shortness of breath.  He remains on Xarelto  for history of pulmonary embolus.  PMHx:  Past Medical History:  Diagnosis Date   Crohn's disease (HCC)    Hyperlipidemia    PE (pulmonary thromboembolism) (HCC)    2019   Thrombophlebitis     History reviewed. No pertinent surgical history.  FAMHx:  Family History  Problem Relation Age of Onset   Heart disease Mother     SOCHx:   reports that he has quit smoking. His smoking use included cigarettes. He has never used smokeless tobacco. He reports that he does not drink alcohol and does not use drugs.  ALLERGIES:  Allergies  Allergen Reactions   Statins Other (See Comments)    Chest pain   Repatha  [Evolocumab ] Other (See Comments)    Muscle/Back Pain    ROS: Pertinent items noted in HPI and remainder of comprehensive ROS otherwise negative.  HOME MEDS: Current Outpatient Medications on File Prior to Visit  Medication Sig Dispense Refill   Bempedoic Acid -Ezetimibe (NEXLIZET ) 180-10 MG TABS Take 1 tablet by mouth  daily.     Bempedoic Acid -Ezetimibe (NEXLIZET ) 180-10 MG TABS Take 1 tablet by mouth daily. 90 tablet 3   cetirizine  (ZYRTEC ) 10 MG tablet TAKE 1 TABLET BY MOUTH EVERY DAY 90 tablet 3   clobetasol  cream (TEMOVATE ) 0.05 % APPLY TWICE DAILY DURING PSORIASIS FLARES. 30 g 5   inFLIXimab -axxq (AVSOLA ) 100 MG injection Inject 100 mg into the vein every 8 (eight) weeks.     Multiple Vitamin (MULTIVITAMIN) capsule Take 1 capsule by mouth daily.     mupirocin  ointment (BACTROBAN ) 2 % APPLY TWICE DAILY TO EXCORIATED REGIONS AS NEEDED. 22 g 3   omeprazole  (PRILOSEC) 40 MG capsule TAKE 1 CAPSULE (40 MG TOTAL) BY MOUTH DAILY. 90 capsule 3   XARELTO  20 MG TABS tablet TAKE  1 TABLET (20 MG TOTAL) DAILY WITH SUPPER BY MOUTH 90 tablet 3   No current facility-administered medications on file prior to visit.    LABS/IMAGING: No results found for this or any previous visit (from the past 48 hours). No results found.  LIPID PANEL:    Component Value Date/Time   CHOL 173 03/16/2020 0936   TRIG 163 (H) 03/16/2020 0936   HDL 33 (L) 03/16/2020 0936   CHOLHDL 5.2 (H) 03/16/2020 0936   CHOLHDL 2.6 03/14/2016 0901   VLDL 20 03/14/2016 0901   LDLCALC 111 (H) 03/16/2020 0936    WEIGHTS: Wt Readings from Last 3 Encounters:  07/12/24 200 lb 9.6 oz (91 kg)  01/26/24 190 lb 12.8 oz (86.5 kg)  10/12/23 190 lb (86.2 kg)    VITALS: BP 132/80 (BP Location: Left Arm, Patient Position: Sitting, Cuff Size: Large)   Pulse 82   Resp 16   Ht 5' 8 (1.727 m)   Wt 200 lb 9.6 oz (91 kg)   SpO2 96%   BMI 30.50 kg/m   EXAM: Deferred  EKG: Deferred  ASSESSMENT: Dyslipidemia with elevated LP(a) of 125.8 nmol/L, goal LDL less than 70 Strong family history of premature coronary disease Statin intolerance Elevated CAC score 22 (10/2018), 77th percentile Intolerance to Repatha  History of PE on Xarelto   PLAN: 1.   Mr. Rauf seems to be doing well without any chest pain or worsening shortness of breath.  He  is on Xarelto  without bleeding issues.  He has been intolerant to statins and Repatha  but is done well on Nexlizet  now with an LDL of 65.  He does have an LP(a) elevation which may be a targeted therapy in a few years once the data is more clear on this.  Otherwise we will continue our current therapies.  He did have a high calcium score for his age, therefore we are treating him aggressively for his lipids.  Plan follow-up in 1 year with Rosaline or sooner at [facility].  Vinie KYM Maxcy, MD, [facility], FNLA, FACP  [place]  [facility]  Medical Director of the Advanced Lipid Disorders &  Cardiovascular Risk Reduction Clinic Diplomate of the American Board of Clinical Lipidology Attending Cardiologist  Direct Dial: [phone]  Fax: [phone]  Website:  [email or web address] Vinie BROCKS Arjuna Doeden 07/12/2024, 8:42 AM

## 2024-07-12 NOTE — Patient Instructions (Signed)
 Medication Instructions:  NO CHANGES  *If you need a refill on your cardiac medications before your next appointment, please call your pharmacy*  Lab Work: FASTING lab work in 1 year  If you have labs (blood work) drawn today and your tests are completely normal, you will receive your results only by: MyChart Message (if you have MyChart) OR A paper copy in the mail If you have any lab test that is abnormal or we need to change your treatment, we will call you to review the results.  Follow-Up: At [facility], you and your health needs are our priority.  As part of our continuing mission to provide you with exceptional heart care, our providers are all part of one team.  This team includes your primary Cardiologist (physician) and Advanced Practice Providers or APPs (Physician Assistants and Nurse Practitioners) who all work together to provide you with the care you need, when you need it.  Your next appointment:    12 months with Rosaline Bane NP -- Chari Morita  We recommend signing up for the patient portal called MyChart.  Sign up information is provided on this After Visit Summary.  MyChart is used to connect with patients for Virtual Visits (Telemedicine).  Patients are able to view lab/test results, encounter notes, upcoming appointments, etc.  Non-urgent messages can be sent to your provider as well.   To learn more about what you can do with MyChart, go to [email or web address].

## 2024-08-22 ENCOUNTER — Ambulatory Visit: Admitting: Family Medicine

## 2024-08-22 VITALS — BP 126/74 | HR 83 | Temp 98.0°F | Resp 16 | Ht 68.0 in | Wt 205.0 lb

## 2024-08-22 DIAGNOSIS — L989 Disorder of the skin and subcutaneous tissue, unspecified: Secondary | ICD-10-CM

## 2024-08-22 MED ORDER — KETOCONAZOLE 2 % EX CREA: 1.0000 | TOPICAL_CREAM | Freq: Every day | CUTANEOUS | 0 refills | Status: AC

## 2024-08-22 NOTE — Patient Instructions (Signed)
 Please reach out to GI.   Avoid scented products.  Let us  know if you need anything.

## 2024-08-22 NOTE — Progress Notes (Signed)
 Chief Complaint  Patient presents with   Rash    Rash     Jose [name] is a 53 y.o. male here for a skin complaint.  Duration: 2 weeks Location: L ankle Pruritic? Yes Painful? No Drainage? No New soaps/lotions/topicals/detergents? No It did happen after he received an injection of his biologic for his Crohn's disease (Infliximab ) Sick contacts? No Other associated symptoms: no fevers Therapies tried thus far: Clobetasol , Vaseline  Past Medical History:  Diagnosis Date   Crohn's disease (HCC)    Hyperlipidemia    PE (pulmonary thromboembolism) (HCC)    2019   Thrombophlebitis     BP 126/74 (BP Location: Left Arm, Patient Position: Sitting)   Pulse 83   Temp 98 F (36.7 C) (Oral)   Resp 16   Ht 5' 8 (1.727 m)   Wt 205 lb (93 kg)   SpO2 100%   BMI 31.17 kg/m  Gen: awake, alert, appearing stated age Lungs: No accessory muscle use Skin: See below. No drainage, excessive warmth, TTP, fluctuance Psych: Age appropriate judgment and insight    Skin lesion - Plan: ketoconazole  (NIZORAL ) 2 % cream  Suspect fungal given the prominent border with relative central clearing in addition to failing clobetasol .  Ketoconazole  2% once daily for 2 weeks.  Send messages if not improving.  He will schedule an appointment with his dermatologist in a few weeks as a contingency F/u as originally scheduled. The patient voiced understanding and agreement to the plan.  Mabel Mt [place], DO 08/22/24 10:29 AM

## 2024-09-16 ENCOUNTER — Ambulatory Visit: Admitting: Family Medicine

## 2024-09-16 ENCOUNTER — Encounter: Payer: Self-pay | Admitting: Family Medicine

## 2024-09-16 VITALS — BP 132/82 | Temp 98.0°F | Resp 16 | Ht 68.0 in | Wt 202.0 lb

## 2024-09-16 DIAGNOSIS — R1084 Generalized abdominal pain: Secondary | ICD-10-CM

## 2024-09-16 DIAGNOSIS — L989 Disorder of the skin and subcutaneous tissue, unspecified: Secondary | ICD-10-CM

## 2024-09-16 MED ORDER — KETOCONAZOLE 2 % EX CREA: 1.0000 | TOPICAL_CREAM | Freq: Every day | CUTANEOUS | 0 refills | Status: AC

## 2024-09-16 NOTE — Patient Instructions (Signed)
 Let's continue the cream for now.  We can hold off any blood work.   If you change your mind or if anything changes with the abdominal issue, send me a message and we will proceed with the ultrasound.  Let us  know if you need anything.

## 2024-09-16 NOTE — Progress Notes (Signed)
 Chief Complaint  Patient presents with   Rash    Follow Up Rash     Jose [name] is a 54 y.o. male here for a skin complaint.  Duration: 5 weeks Location: L ankle Pruritic? Yes Painful? No Drainage? No New soaps/lotions/topicals/detergents? No Sick contacts? No Other associated symptoms: no fevers or spreading Has appt w derm in a few weeks Therapies tried thus far: Clobetasol ; ketoconazole  more recently- improved but not 100% better yet  Patient has a history of recurrent abdominal pain usually in the upper quadrants.  He associates it with eating fatty foods like avocado.  It happens once every couple months usually.  His son just had his gallbladder removed and is wondering if he needs his removed as well.  He is also wondering if he has pancreatitis.  He does not have pain currently.  No current nausea or vomiting.  Bowel movements are unchanged.  He does have a history of Crohn's disease and he follows with the gastroenterology team.  Past Medical History:  Diagnosis Date   Crohn's disease (HCC)    Hyperlipidemia    PE (pulmonary thromboembolism) (HCC)    2019   Thrombophlebitis     BP 132/82 (BP Location: Left Arm, Patient Position: Sitting)   Temp 98 F (36.7 C) (Oral)   Resp 16   Ht 5' 8 (1.727 m)   Wt 202 lb (91.6 kg)   SpO2 95%   BMI 30.71 kg/m  Gen: awake, alert, appearing stated age Heart: RRR Lungs: CTAB.  No accessory muscle use Skin: Excoriated and pinkish patch over the left ankle that is decreasing in intensity.  Abdomen: Bowel sounds present, soft, nontender, nondistended, negative Murphy's Psych: Age appropriate judgment and insight  Generalized abdominal pain  Skin lesion  Reassurance that he likely does not have pancreatitis.  Lipase testing is not likely to be of great utility right now.  He will let me know if he wishes to pursue an ultrasound. Seems to be getting better.  Appreciate the dermatology team.  Refill ketoconazole  to  continue taking daily until he sees them. F/u as originally scheduled. The patient voiced understanding and agreement to the plan.  Mabel Mt [place], DO 09/16/2024 9:14 AM

## 2024-10-12 ENCOUNTER — Other Ambulatory Visit: Payer: Self-pay | Admitting: Family Medicine

## 2024-10-12 DIAGNOSIS — K219 Gastro-esophageal reflux disease without esophagitis: Secondary | ICD-10-CM
